# Patient Record
Sex: Male | Born: 1960
Health system: Southern US, Community
[De-identification: ages and names within clinical notes are randomized; demographics above are authoritative.]

## PROBLEM LIST (undated history)

## (undated) DIAGNOSIS — J45909 Unspecified asthma, uncomplicated: Secondary | ICD-10-CM

## (undated) DIAGNOSIS — I219 Acute myocardial infarction, unspecified: Secondary | ICD-10-CM

## (undated) DIAGNOSIS — I10 Essential (primary) hypertension: Secondary | ICD-10-CM

## (undated) DIAGNOSIS — E119 Type 2 diabetes mellitus without complications: Secondary | ICD-10-CM

## (undated) DIAGNOSIS — I493 Ventricular premature depolarization: Secondary | ICD-10-CM

## (undated) DIAGNOSIS — K859 Acute pancreatitis without necrosis or infection, unspecified: Secondary | ICD-10-CM

## (undated) HISTORY — DX: Type 2 diabetes mellitus without complications: E11.9

## (undated) HISTORY — PX: APPENDECTOMY: SHX54

## (undated) HISTORY — PX: HERNIA REPAIR: SHX51

## (undated) HISTORY — DX: Unspecified asthma, uncomplicated: J45.909

---

## 1999-09-23 ENCOUNTER — Ambulatory Visit (HOSPITAL_COMMUNITY): Admission: RE | Admit: 1999-09-23 | Discharge: 1999-09-23 | Payer: Self-pay

## 1999-09-23 ENCOUNTER — Encounter: Payer: Self-pay | Admitting: Orthopedic Surgery

## 2005-12-16 ENCOUNTER — Emergency Department (HOSPITAL_COMMUNITY): Admission: EM | Admit: 2005-12-16 | Discharge: 2005-12-16 | Payer: Self-pay | Admitting: Emergency Medicine

## 2011-08-01 ENCOUNTER — Emergency Department (HOSPITAL_BASED_OUTPATIENT_CLINIC_OR_DEPARTMENT_OTHER): Payer: Self-pay

## 2011-08-01 ENCOUNTER — Inpatient Hospital Stay (HOSPITAL_BASED_OUTPATIENT_CLINIC_OR_DEPARTMENT_OTHER)
Admission: EM | Admit: 2011-08-01 | Discharge: 2011-08-05 | DRG: 438 | Disposition: A | Discharge status: Home / Self Care | Payer: Self-pay | Attending: Internal Medicine | Admitting: Internal Medicine

## 2011-08-01 ENCOUNTER — Other Ambulatory Visit: Payer: Self-pay

## 2011-08-01 ENCOUNTER — Encounter (HOSPITAL_BASED_OUTPATIENT_CLINIC_OR_DEPARTMENT_OTHER): Payer: Self-pay

## 2011-08-01 ENCOUNTER — Emergency Department (INDEPENDENT_AMBULATORY_CARE_PROVIDER_SITE_OTHER): Payer: Self-pay

## 2011-08-01 DIAGNOSIS — F1020 Alcohol dependence, uncomplicated: Secondary | ICD-10-CM | POA: Diagnosis present

## 2011-08-01 DIAGNOSIS — E876 Hypokalemia: Secondary | ICD-10-CM | POA: Diagnosis present

## 2011-08-01 DIAGNOSIS — K5730 Diverticulosis of large intestine without perforation or abscess without bleeding: Secondary | ICD-10-CM

## 2011-08-01 DIAGNOSIS — R110 Nausea: Secondary | ICD-10-CM

## 2011-08-01 DIAGNOSIS — N390 Urinary tract infection, site not specified: Secondary | ICD-10-CM | POA: Diagnosis present

## 2011-08-01 DIAGNOSIS — Z79899 Other long term (current) drug therapy: Secondary | ICD-10-CM

## 2011-08-01 DIAGNOSIS — IMO0002 Reserved for concepts with insufficient information to code with codable children: Secondary | ICD-10-CM | POA: Diagnosis present

## 2011-08-01 DIAGNOSIS — K869 Disease of pancreas, unspecified: Secondary | ICD-10-CM | POA: Diagnosis present

## 2011-08-01 DIAGNOSIS — E1069 Type 1 diabetes mellitus with other specified complication: Secondary | ICD-10-CM | POA: Diagnosis present

## 2011-08-01 DIAGNOSIS — E1110 Type 2 diabetes mellitus with ketoacidosis without coma: Secondary | ICD-10-CM

## 2011-08-01 DIAGNOSIS — R109 Unspecified abdominal pain: Secondary | ICD-10-CM

## 2011-08-01 DIAGNOSIS — K7689 Other specified diseases of liver: Secondary | ICD-10-CM

## 2011-08-01 DIAGNOSIS — E1010 Type 1 diabetes mellitus with ketoacidosis without coma: Secondary | ICD-10-CM | POA: Diagnosis present

## 2011-08-01 DIAGNOSIS — M79609 Pain in unspecified limb: Secondary | ICD-10-CM

## 2011-08-01 DIAGNOSIS — E782 Mixed hyperlipidemia: Secondary | ICD-10-CM | POA: Diagnosis present

## 2011-08-01 DIAGNOSIS — F17200 Nicotine dependence, unspecified, uncomplicated: Secondary | ICD-10-CM | POA: Diagnosis present

## 2011-08-01 DIAGNOSIS — K859 Acute pancreatitis without necrosis or infection, unspecified: Principal | ICD-10-CM

## 2011-08-01 DIAGNOSIS — D72829 Elevated white blood cell count, unspecified: Secondary | ICD-10-CM

## 2011-08-01 DIAGNOSIS — Z794 Long term (current) use of insulin: Secondary | ICD-10-CM

## 2011-08-01 DIAGNOSIS — R079 Chest pain, unspecified: Secondary | ICD-10-CM

## 2011-08-01 DIAGNOSIS — E1169 Type 2 diabetes mellitus with other specified complication: Secondary | ICD-10-CM | POA: Diagnosis present

## 2011-08-01 HISTORY — DX: Ventricular premature depolarization: I49.3

## 2011-08-01 LAB — DIFFERENTIAL
Basophils Absolute: 0 10*3/uL (ref 0.0–0.1)
Basophils Relative: 0 % (ref 0–1)
Eosinophils Relative: 0 % (ref 0–5)
Monocytes Absolute: 1 10*3/uL (ref 0.1–1.0)
Monocytes Relative: 7 % (ref 3–12)

## 2011-08-01 LAB — BASIC METABOLIC PANEL
BUN: 16 mg/dL (ref 6–23)
BUN: 15 mg/dL (ref 6–23)
CO2: 18 mEq/L — ABNORMAL LOW (ref 19–32)
CO2: 18 mEq/L — ABNORMAL LOW (ref 19–32)
Calcium: 8.6 mg/dL (ref 8.4–10.5)
Calcium: 8.1 mg/dL — ABNORMAL LOW (ref 8.4–10.5)
Calcium: 7.9 mg/dL — ABNORMAL LOW (ref 8.4–10.5)
Chloride: 94 mEq/L — ABNORMAL LOW (ref 96–112)
Chloride: 97 mEq/L (ref 96–112)
Creatinine, Ser: 0.8 mg/dL (ref 0.50–1.35)
Creatinine, Ser: 0.61 mg/dL (ref 0.50–1.35)
Creatinine, Ser: 0.61 mg/dL (ref 0.50–1.35)
GFR calc Af Amer: >90 mL/min (ref >90)
GFR calc Af Amer: >90 mL/min (ref >90)
GFR calc non Af Amer: >90 mL/min (ref >90)
Glucose, Bld: 325 mg/dL — ABNORMAL HIGH (ref 70–99)
Glucose, Bld: 196 mg/dL — ABNORMAL HIGH (ref 70–99)
Potassium: 3.8 mEq/L (ref 3.5–5.1)

## 2011-08-01 LAB — CBC
MCH: 33.3 pg (ref 26.0–34.0)
MCH: 34.5 pg — ABNORMAL HIGH (ref 26.0–34.0)
MCHC: 36.5 g/dL — ABNORMAL HIGH (ref 30.0–36.0)
MCHC: 37.7 g/dL — ABNORMAL HIGH (ref 30.0–36.0)
MCV: 91.3 fL (ref 78.0–100.0)
Platelets: 190 10*3/uL (ref 150–400)
Platelets: 149 10*3/uL — ABNORMAL LOW (ref 150–400)
RDW: 13.3 % (ref 11.5–15.5)
RDW: 13.1 % (ref 11.5–15.5)
WBC: 14.9 10*3/uL — ABNORMAL HIGH (ref 4.0–10.5)

## 2011-08-01 LAB — POCT I-STAT 3, ART BLOOD GAS (G3+)
Bicarbonate: 13.4 mEq/L — ABNORMAL LOW (ref 20.0–24.0)
O2 Saturation: 96 %
TCO2: 14 mmol/L (ref 0–100)
pCO2 arterial: 27.9 mmHg — ABNORMAL LOW (ref 35.0–45.0)
pO2, Arterial: 88 mmHg (ref 80.0–100.0)

## 2011-08-01 LAB — CARDIAC PANEL(CRET KIN+CKTOT+MB+TROPI): CK, MB: 1.6 ng/mL (ref 0.3–4.0)

## 2011-08-01 LAB — GLUCOSE, CAPILLARY
Glucose-Capillary: 330 mg/dL — ABNORMAL HIGH (ref 70–99)
Glucose-Capillary: 214 mg/dL — ABNORMAL HIGH (ref 70–99)
Glucose-Capillary: 191 mg/dL — ABNORMAL HIGH (ref 70–99)

## 2011-08-01 LAB — MRSA PCR SCREENING: MRSA by PCR: NEGATIVE

## 2011-08-01 LAB — HEPATIC FUNCTION PANEL
ALT: 46 U/L (ref 0–53)
Bilirubin, Direct: 0.3 mg/dL (ref 0.0–0.3)
Indirect Bilirubin: 1 mg/dL — ABNORMAL HIGH (ref 0.3–0.9)
Total Bilirubin: 1.3 mg/dL — ABNORMAL HIGH (ref 0.3–1.2)

## 2011-08-01 LAB — URINALYSIS, ROUTINE W REFLEX MICROSCOPIC
Ketones, ur: >80 mg/dL — AB
Nitrite: NEGATIVE
Urobilinogen, UA: 0.2 mg/dL (ref 0.0–1.0)

## 2011-08-01 LAB — URINE MICROSCOPIC-ADD ON

## 2011-08-01 LAB — TROPONIN I: Troponin I: <0.3 ng/mL (ref <0.30)

## 2011-08-01 MED ORDER — SODIUM CHLORIDE 0.9 % IV SOLN: INTRAVENOUS | Status: DC

## 2011-08-01 MED ORDER — THIAMINE HCL 100 MG/ML IJ SOLN
100.0000 mg | Freq: Every day | INTRAMUSCULAR | Status: DC
  Administered 2011-08-02: 100 mg via INTRAVENOUS
  Filled 2011-08-01 (×5): qty 1

## 2011-08-01 MED ORDER — SODIUM CHLORIDE 0.9 % IV BOLUS (SEPSIS)
1000.0000 mL | Freq: Once | INTRAVENOUS | Status: AC
  Administered 2011-08-01: 1000 mL via INTRAVENOUS

## 2011-08-01 MED ORDER — LORAZEPAM 2 MG/ML IJ SOLN
0.0000 mg | Freq: Four times a day (QID) | INTRAMUSCULAR | Status: AC
  Administered 2011-08-02: 2 mg via INTRAVENOUS
  Administered 2011-08-02 (×2): 1 mg via INTRAVENOUS
  Filled 2011-08-01 (×2): qty 1

## 2011-08-01 MED ORDER — DEXTROSE 50 % IV SOLN: 25.0000 mL | INTRAVENOUS | Status: DC | PRN

## 2011-08-01 MED ORDER — IOHEXOL 300 MG/ML  SOLN
100.0000 mL | Freq: Once | INTRAMUSCULAR | Status: AC | PRN
  Administered 2011-08-01: 100 mL via INTRAVENOUS

## 2011-08-01 MED ORDER — LORAZEPAM 2 MG/ML IJ SOLN
1.0000 mg | Freq: Four times a day (QID) | INTRAMUSCULAR | Status: AC | PRN
  Administered 2011-08-02: 1 mg via INTRAVENOUS
  Filled 2011-08-01 (×2): qty 1

## 2011-08-01 MED ORDER — FOLIC ACID 1 MG PO TABS
1.0000 mg | ORAL_TABLET | Freq: Every day | ORAL | Status: DC
  Administered 2011-08-01 – 2011-08-05 (×5): 1 mg via ORAL
  Filled 2011-08-01 (×5): qty 1

## 2011-08-01 MED ORDER — LORAZEPAM 2 MG/ML IJ SOLN: 0.0000 mg | Freq: Two times a day (BID) | INTRAMUSCULAR | Status: DC

## 2011-08-01 MED ORDER — HYDROMORPHONE HCL PF 1 MG/ML IJ SOLN
1.0000 mg | Freq: Once | INTRAMUSCULAR | Status: AC
  Administered 2011-08-01: 1 mg via INTRAVENOUS
  Filled 2011-08-01: qty 1

## 2011-08-01 MED ORDER — ONDANSETRON HCL 4 MG/2ML IJ SOLN
4.0000 mg | Freq: Once | INTRAMUSCULAR | Status: AC
  Administered 2011-08-01: 4 mg via INTRAVENOUS
  Filled 2011-08-01: qty 2

## 2011-08-01 MED ORDER — SODIUM CHLORIDE 0.9 % IV BOLUS (SEPSIS): 1000.0000 mL | Freq: Once | INTRAVENOUS | Status: DC

## 2011-08-01 MED ORDER — POTASSIUM CHLORIDE 10 MEQ/100ML IV SOLN
10.0000 meq | Freq: Once | INTRAVENOUS | Status: AC
  Administered 2011-08-01: 10 meq via INTRAVENOUS
  Filled 2011-08-01: qty 100

## 2011-08-01 MED ORDER — SODIUM CHLORIDE 0.9 % IV SOLN
INTRAVENOUS | Status: AC
  Administered 2011-08-01 (×2): via INTRAVENOUS

## 2011-08-01 MED ORDER — VITAMIN B-1 100 MG PO TABS
100.0000 mg | ORAL_TABLET | Freq: Every day | ORAL | Status: DC
  Administered 2011-08-01 – 2011-08-05 (×4): 100 mg via ORAL
  Filled 2011-08-01 (×5): qty 1

## 2011-08-01 MED ORDER — ADULT MULTIVITAMIN W/MINERALS CH
1.0000 | ORAL_TABLET | Freq: Every day | ORAL | Status: DC
  Administered 2011-08-01 – 2011-08-05 (×5): 1 via ORAL
  Filled 2011-08-01 (×5): qty 1

## 2011-08-01 MED ORDER — ONDANSETRON HCL 4 MG/2ML IJ SOLN
4.0000 mg | Freq: Three times a day (TID) | INTRAMUSCULAR | Status: AC | PRN
  Administered 2011-08-01: 4 mg via INTRAVENOUS
  Filled 2011-08-01: qty 2

## 2011-08-01 MED ORDER — SODIUM CHLORIDE 0.9 % IV SOLN
INTRAVENOUS | Status: DC
  Administered 2011-08-01: 9.6 [IU]/h via INTRAVENOUS
  Administered 2011-08-01: 2.8 [IU]/h via INTRAVENOUS
  Filled 2011-08-01: qty 3

## 2011-08-01 MED ORDER — IOHEXOL 300 MG/ML  SOLN
20.0000 mL | INTRAMUSCULAR | Status: AC
  Administered 2011-08-01 (×2): 20 mL via ORAL

## 2011-08-01 MED ORDER — DEXTROSE-NACL 5-0.45 % IV SOLN
INTRAVENOUS | Status: DC
  Administered 2011-08-01 – 2011-08-02 (×3): via INTRAVENOUS

## 2011-08-01 MED ORDER — SODIUM CHLORIDE 0.9 % IV SOLN
999.0000 mL | INTRAVENOUS | Status: DC
  Administered 2011-08-01: 999 mL via INTRAVENOUS

## 2011-08-01 MED ORDER — POTASSIUM CHLORIDE 10 MEQ/100ML IV SOLN: 10.0000 meq | INTRAVENOUS | Status: DC

## 2011-08-01 MED ORDER — HYDROMORPHONE HCL PF 1 MG/ML IJ SOLN
1.0000 mg | INTRAMUSCULAR | Status: AC | PRN
  Administered 2011-08-01 (×2): 1 mg via INTRAVENOUS
  Filled 2011-08-01 (×3): qty 1

## 2011-08-01 MED ORDER — LORAZEPAM 1 MG PO TABS: 1.0000 mg | ORAL_TABLET | Freq: Four times a day (QID) | ORAL | Status: AC | PRN

## 2011-08-01 MED ORDER — SODIUM CHLORIDE 0.9 % IV SOLN
INTRAVENOUS | Status: DC
  Administered 2011-08-02: 01:00:00 via INTRAVENOUS
  Filled 2011-08-01 (×2): qty 1

## 2011-08-01 MED ORDER — SODIUM CHLORIDE 0.9 % IV SOLN
INTRAVENOUS | Status: AC
  Administered 2011-08-01: 21:00:00 via INTRAVENOUS

## 2011-08-01 NOTE — ED Provider Notes (Signed)
History     CSN: [id number]  Arrival date & time 08/01/11  1230   First MD Initiated Contact with Patient 08/01/11 1308      Chief Complaint  Patient presents with  . Abdominal Pain  . Chest Pain    (Consider location/radiation/quality/duration/timing/severity/associated sxs/prior treatment) Patient is a 51 y.o. male presenting with abdominal pain and chest pain. The history is provided by the patient.  Abdominal Pain The primary symptoms of the illness include abdominal pain. The current episode started 2 days ago. The onset of the illness was gradual. The problem has been gradually worsening.  The abdominal pain is located in the epigastric region. The abdominal pain radiates to the chest. The abdominal pain is relieved by nothing.  Chest Pain Primary symptoms include abdominal pain.   Pt has not been eating but can drink water.  No vomiting although nauseated except for one episode after eating yogurt..  He has been constipated for 3 days.  The pain is cramping and severe.  He did have a few beers on Thursday but nothing since.  Past Medical History  Diagnosis Date  . A-fib     Past Surgical History  Procedure Date  . Appendectomy     History reviewed. No pertinent family history.  History  Substance Use Topics  . Smoking status: Former Smoker -- 0.5 packs/day    Types: Cigarettes    Quit date: 07/31/2010  . Smokeless tobacco: Never Used  . Alcohol Use: Yes   social history currently unemployed    Review of Systems  Cardiovascular: Positive for chest pain.  Gastrointestinal: Positive for abdominal pain.  All other systems reviewed and are negative.    Allergies  Review of patient's allergies indicates no known allergies.  Home Medications  No current outpatient prescriptions on file.  BP 149/95  Pulse 93  Temp(Src) 97.9 F (36.6 C) (Oral)  Resp 17  Ht 5\' 7"  (1.702 m)  Wt 200 lb (90.719 kg)  BMI 31.32 kg/m2  SpO2 97%  Physical Exam  Nursing  note and vitals reviewed. Constitutional: He appears well-developed and well-nourished. No distress.  HENT:  Head: Normocephalic and atraumatic.  Right Ear: External ear normal.  Left Ear: External ear normal.  Eyes: Conjunctivae are normal. Right eye exhibits no discharge. Left eye exhibits no discharge. No scleral icterus.  Neck: Neck supple. No tracheal deviation present.  Cardiovascular: Normal rate, regular rhythm and intact distal pulses.   Pulmonary/Chest: Effort normal and breath sounds normal. No stridor. No respiratory distress. He has no wheezes. He has no rales.  Abdominal: Soft. Bowel sounds are normal. He exhibits no distension. There is tenderness (diffusely tender). There is guarding. There is no rebound.  Musculoskeletal: He exhibits no edema and no tenderness.  Neurological: He is alert. He has normal strength. No sensory deficit. Cranial nerve deficit:  no gross defecits noted. He exhibits normal muscle tone. He displays no seizure activity. Coordination normal.  Skin: Skin is warm and dry. No rash noted.  Psychiatric: He has a normal mood and affect.    ED Course  Procedures (including critical care time)  Date: 08/01/2011  Rate: 111  Rhythm: sinus tachycardia  QRS Axis: normal  Intervals: normal  ST/T Wave abnormalities: nonspecific ST changes  Conduction Disutrbances:none  Narrative Interpretation:   Old EKG Reviewed: unchanged  Medications  0.9 %  sodium chloride infusion (999 mL Intravenous New Bag/Given 08/01/11 1408)  insulin regular (NOVOLIN R,HUMULIN R) 1 Units/mL in sodium chloride 0.9 % 100  mL infusion (not administered)  0.9 %  sodium chloride infusion (not administered)  HYDROmorphone (DILAUDID) injection 1 mg (1 mg Intravenous Given 08/01/11 1324)  ondansetron (ZOFRAN) injection 4 mg (4 mg Intravenous Given 08/01/11 1324)  sodium chloride 0.9 % bolus 1,000 mL (1000 mL Intravenous Given 08/01/11 1324)     Labs Reviewed  BASIC METABOLIC PANEL - Abnormal;  Notable for the following:    Sodium 128 (*)    Chloride 89 (*)    CO2 14 (*)    Glucose, Bld 414 (*)    All other components within normal limits  HEPATIC FUNCTION PANEL - Abnormal; Notable for the following:    Total Bilirubin 1.3 (*)    Indirect Bilirubin 1.0 (*)    All other components within normal limits  LIPASE, BLOOD - Abnormal; Notable for the following:    Lipase 886 (*) RESULT CONFIRMED BY AUTOMATED DILUTION   All other components within normal limits  TROPONIN I  CBC  DIFFERENTIAL  LIPASE, BLOOD  URINALYSIS, ROUTINE W REFLEX MICROSCOPIC  HEPATIC FUNCTION PANEL  DIFFERENTIAL   No results found.   1. Acute pancreatitis   2. Diabetic ketoacidosis       MDM  The patient has acute pancreatitis with new onset diabetes and hyperglycemia. Patient also has a metabolic acidosis with an anion gap of 25 consistent with diabetic ketoacidosis.  Patient has no known history of diabetes. The etiology of his pancreatitis is likely related to alcohol disease. When I questioned the patient further he does admit to drinking alcohol very regularly times heavily. IV fluids been started as well as pain medications. Will order an insulin infusion. This remains hemodynamically stable that he would need to be admitted to the hospital for further treatment and evaluation.  CRITICAL CARE Performed by: Celene Kras   Total critical care time: 35 Critical care time was exclusive of separately billable procedures and treating other patients.  Critical care was necessary to treat or prevent imminent or life-threatening deterioration.  Critical care was time spent personally by me on the following activities: development of treatment plan with patient and/or surrogate as well as nursing, discussions with consultants, evaluation of patient's response to treatment, examination of patient, obtaining history from patient or surrogate, ordering and performing treatments and interventions, ordering and  review of laboratory studies, ordering and review of radiographic studies, pulse oximetry and re-evaluation of patient's condition.        Celene Kras, MD 08/01/11 1500

## 2011-08-01 NOTE — H&P (Signed)
Kyle Barber is an 51 y.o. male.   PCP - None Chief Complaint: Abdominal pain. HPI: 51 years old male with no significant past medical history, who drinks alcohol everyday for last many years present to the ER because of persistent abdominal pain over the last 3 days. Patient states that 3 days ago pain started around epigastric area and it was like a burning sensation and he tried to take over-the-counter antacids which did not relieve his pain. The pain progressively worsened and became more diffuse and associated with nausea and vomiting when he decided to come to the ER. In the ER at med center [facility] patient was found to have a high lipase and high blood sugar with an anion gap. Patient had a CAT scan of the abdomen and pelvis which showed pancreatitis patient has been admitted for further management. Patient states earlier in the day he had mild shortness of breath but presently he is not short of breath denies any chest pain dizziness loss of consciousness fever chills or diarrhea.  Past Medical History  Diagnosis Date  . Premature ventricular contraction     Past Surgical History  Procedure Date  . Appendectomy     History reviewed. No pertinent family history. Social History:  reports that he quit smoking about 4 weeks ago. His smoking use included Cigarettes. He has a 2.5 pack-year smoking history. He quit smokeless tobacco use about 4 weeks ago. He reports that he drinks about 16.8 ounces of alcohol per week. He reports that he does not use illicit drugs.  Allergies: No Known Allergies  Medications Prior to Admission  Medication Dose Route Frequency Provider Last Rate Last Dose  . 0.9 %  sodium chloride infusion   Intravenous STAT Celene Kras, MD 150 mL/hr at 08/01/11 1620    . 0.9 %  sodium chloride infusion   Intravenous Continuous Eduard Clos, MD      . 0.9 %  sodium chloride infusion   Intravenous Continuous Eduard Clos, MD      . dextrose 5 %-0.45 %  sodium chloride infusion   Intravenous Continuous Eduard Clos, MD      . dextrose 50 % solution 25 mL  25 mL Intravenous PRN Eduard Clos, MD      . HYDROmorphone (DILAUDID) injection 1 mg  1 mg Intravenous Once Celene Kras, MD   1 mg at 08/01/11 1324  . HYDROmorphone (DILAUDID) injection 1 mg  1 mg Intravenous Q4H PRN Celene Kras, MD   1 mg at 08/01/11 1829  . insulin regular (NOVOLIN R,HUMULIN R) 1 Units/mL in sodium chloride 0.9 % 100 mL infusion   Intravenous Continuous Celene Kras, MD 9.2 mL/hr at 08/01/11 2029 9.2 Units/hr at 08/01/11 2029  . insulin regular (NOVOLIN R,HUMULIN R) 1 Units/mL in sodium chloride 0.9 % 100 mL infusion   Intravenous Continuous Eduard Clos, MD      . iohexol (OMNIPAQUE) 300 MG/ML solution 100 mL  100 mL Intravenous Once PRN Medication Radiologist, MD   100 mL at 08/01/11 1644  . iohexol (OMNIPAQUE) 300 MG/ML solution 20 mL  20 mL Oral Q1 Hr x 2 Medication Radiologist, MD   20 mL at 08/01/11 1612  . ondansetron (ZOFRAN) injection 4 mg  4 mg Intravenous Once Celene Kras, MD   4 mg at 08/01/11 1324  . ondansetron (ZOFRAN) injection 4 mg  4 mg Intravenous Q8H PRN Celene Kras, MD   4  mg at 08/01/11 1829  . potassium chloride 10 mEq in 100 mL IVPB  10 mEq Intravenous Once Raeford Razor, MD 100 mL/hr at 08/01/11 1739 10 mEq at 08/01/11 1739  . potassium chloride 10 mEq in 100 mL IVPB  10 mEq Intravenous Q1H Eduard Clos, MD      . sodium chloride 0.9 % bolus 1,000 mL  1,000 mL Intravenous Once Celene Kras, MD   1,000 mL at 08/01/11 1324  . sodium chloride 0.9 % bolus 1,000 mL  1,000 mL Intravenous Once Celene Kras, MD   1,000 mL at 08/01/11 1600  . DISCONTD: 0.9 %  sodium chloride infusion  999 mL Intravenous Continuous Celene Kras, MD   999 mL at 08/01/11 1408  . DISCONTD: 0.9 %  sodium chloride infusion   Intravenous Continuous Celene Kras, MD      . DISCONTD: sodium chloride 0.9 % bolus 1,000 mL  1,000 mL Intravenous Once Celene Kras, MD        No current outpatient prescriptions on file as of 08/01/2011.    Results for orders placed during the hospital encounter of 08/01/11 (from the past 48 hour(s))  CBC     Status: Abnormal   Collection Time   08/01/11  1:01 PM      Component Value Range Comment   WBC 14.9 (*) 4.0 - 10.5 (K/uL)    RBC 4.80  4.22 - 5.81 (MIL/uL)    Hemoglobin 16.0  13.0 - 17.0 (g/dL)    HCT 16.1  09.6 - 04.5 (%)    MCV 91.3  78.0 - 100.0 (fL)    MCH 33.3  26.0 - 34.0 (pg)    MCHC 36.5 (*) 30.0 - 36.0 (g/dL) CORRECTED FOR INTERFERING SUBSTANCE   RDW 13.3  11.5 - 15.5 (%)    Platelets 190  150 - 400 (K/uL)   BASIC METABOLIC PANEL     Status: Abnormal   Collection Time   08/01/11  1:01 PM      Component Value Range Comment   Sodium 128 (*) 135 - 145 (mEq/L)    Potassium 4.0  3.5 - 5.1 (mEq/L)    Chloride 89 (*) 96 - 112 (mEq/L)    CO2 14 (*) 19 - 32 (mEq/L)    Glucose, Bld 414 (*) 70 - 99 (mg/dL)    BUN 15  6 - 23 (mg/dL)    Creatinine, Ser 4.09  0.50 - 1.35 (mg/dL)    Calcium 8.6  8.4 - 10.5 (mg/dL)    GFR calc non Af Amer >90  >90 (mL/min)    GFR calc Af Amer >90  >90 (mL/min)   TROPONIN I     Status: Normal   Collection Time   08/01/11  1:01 PM      Component Value Range Comment   Troponin I <0.30  <0.30 (ng/mL)   DIFFERENTIAL     Status: Abnormal   Collection Time   08/01/11  1:01 PM      Component Value Range Comment   Neutrophils Relative 84 (*) 43 - 77 (%)    Neutro Abs 12.5 (*) 1.7 - 7.7 (K/uL)    Lymphocytes Relative 9 (*) 12 - 46 (%)    Lymphs Abs 1.3  0.7 - 4.0 (K/uL)    Monocytes Relative 7  3 - 12 (%)    Monocytes Absolute 1.0  0.1 - 1.0 (K/uL)    Eosinophils Relative 0  0 - 5 (%)  Eosinophils Absolute 0.0  0.0 - 0.7 (K/uL)    Basophils Relative 0  0 - 1 (%)    Basophils Absolute 0.0  0.0 - 0.1 (K/uL)   HEPATIC FUNCTION PANEL     Status: Abnormal   Collection Time   08/01/11  1:01 PM      Component Value Range Comment   Total Protein 7.9  6.0 - 8.3 (g/dL)    Albumin 3.8  3.5  - 5.2 (g/dL)    AST 18  0 - 37 (U/L)    ALT 46  0 - 53 (U/L)    Alkaline Phosphatase 79  39 - 117 (U/L)    Total Bilirubin 1.3 (*) 0.3 - 1.2 (mg/dL)    Bilirubin, Direct 0.3  0.0 - 0.3 (mg/dL)    Indirect Bilirubin 1.0 (*) 0.3 - 0.9 (mg/dL)   LIPASE, BLOOD     Status: Abnormal   Collection Time   08/01/11  1:01 PM      Component Value Range Comment   Lipase 886 (*) 11 - 59 (U/L) RESULT CONFIRMED BY AUTOMATED DILUTION  GLUCOSE, CAPILLARY     Status: Abnormal   Collection Time   08/01/11  3:05 PM      Component Value Range Comment   Glucose-Capillary 343 (*) 70 - 99 (mg/dL)    Comment 1 Documented in Chart     POCT I-STAT 3, BLOOD GAS (G3+)     Status: Abnormal   Collection Time   08/01/11  3:34 PM      Component Value Range Comment   pH, Arterial 7.289 (*) 7.350 - 7.450     pCO2 arterial 27.9 (*) 35.0 - 45.0 (mmHg)    pO2, Arterial 88.0  80.0 - 100.0 (mmHg)    Bicarbonate 13.4 (*) 20.0 - 24.0 (mEq/L)    TCO2 14  0 - 100 (mmol/L)    O2 Saturation 96.0      Acid-base deficit 11.0 (*) 0.0 - 2.0 (mmol/L)    Patient temperature 99.0 F      Collection site RADIAL, ALLEN'S TEST ACCEPTABLE      Drawn by RT      Sample type ARTERIAL     URINALYSIS, ROUTINE W REFLEX MICROSCOPIC     Status: Abnormal   Collection Time   08/01/11  3:54 PM      Component Value Range Comment   Color, Urine YELLOW  YELLOW     APPearance CLEAR  CLEAR     Specific Gravity, Urine 1.040 (*) 1.005 - 1.030     pH 5.5  5.0 - 8.0     Glucose, UA >1000 (*) NEGATIVE (mg/dL)    Hgb urine dipstick NEGATIVE  NEGATIVE     Bilirubin Urine LARGE (*) NEGATIVE     Ketones, ur >80 (*) NEGATIVE (mg/dL)    Protein, ur 30 (*) NEGATIVE (mg/dL)    Urobilinogen, UA 0.2  0.0 - 1.0 (mg/dL)    Nitrite NEGATIVE  NEGATIVE     Leukocytes, UA NEGATIVE  NEGATIVE    URINE MICROSCOPIC-ADD ON     Status: Abnormal   Collection Time   08/01/11  3:54 PM      Component Value Range Comment   Squamous Epithelial / LPF RARE  RARE     WBC, UA 0-2   <3 (WBC/hpf)    RBC / HPF 0-2  <3 (RBC/hpf)    Bacteria, UA MANY (*) RARE     Casts GRANULAR CAST (*) NEGATIVE  HYALINE CASTS  GLUCOSE, CAPILLARY     Status: Abnormal   Collection Time   08/01/11  4:06 PM      Component Value Range Comment   Glucose-Capillary 370 (*) 70 - 99 (mg/dL)   BASIC METABOLIC PANEL     Status: Abnormal   Collection Time   08/01/11  4:45 PM      Component Value Range Comment   Sodium 128 (*) 135 - 145 (mEq/L)    Potassium 3.8  3.5 - 5.1 (mEq/L)    Chloride 94 (*) 96 - 112 (mEq/L)    CO2 18 (*) 19 - 32 (mEq/L)    Glucose, Bld 325 (*) 70 - 99 (mg/dL)    BUN 16  6 - 23 (mg/dL)    Creatinine, Ser 1.61  0.50 - 1.35 (mg/dL)    Calcium 7.7 (*) 8.4 - 10.5 (mg/dL)    GFR calc non Af Amer >90  >90 (mL/min)    GFR calc Af Amer >90  >90 (mL/min)   GLUCOSE, CAPILLARY     Status: Abnormal   Collection Time   08/01/11  5:13 PM      Component Value Range Comment   Glucose-Capillary 330 (*) 70 - 99 (mg/dL)   GLUCOSE, CAPILLARY     Status: Abnormal   Collection Time   08/01/11  6:03 PM      Component Value Range Comment   Glucose-Capillary 301 (*) 70 - 99 (mg/dL)    Comment 1 Notify RN      Comment 2 Documented in Chart     GLUCOSE, CAPILLARY     Status: Abnormal   Collection Time   08/01/11  7:22 PM      Component Value Range Comment   Glucose-Capillary 252 (*) 70 - 99 (mg/dL)    Comment 1 Notify RN      Comment 2 Documented in Chart     GLUCOSE, CAPILLARY     Status: Abnormal   Collection Time   08/01/11  8:26 PM      Component Value Range Comment   Glucose-Capillary 214 (*) 70 - 99 (mg/dL)    Dg Chest 2 View  0/02/6044  *RADIOLOGY REPORT*  Clinical Data: Abdominal pain.  Chest pain.  Left arm pain and nausea.  CHEST - 2 VIEW  Comparison: 12/16/2005.  Findings: Healed right posterior rib fractures.  The lungs appear clear aside from basilar atelectasis.  Cardiopericardial silhouette appears within normal limits.  Monitoring leads are projected over the chest.  Trachea  midline. No airspace disease.  No effusion.  IMPRESSION: No active cardiopulmonary disease.  Original Report Authenticated By: Andreas Newport, M.D.   Ct Abdomen Pelvis W Contrast  08/01/2011  *RADIOLOGY REPORT*  Clinical Data: Acute pancreatitis.  Leukocytosis.  Diffuse abdominal pain.  Nausea.  CT ABDOMEN AND PELVIS WITH CONTRAST  Technique:  Multidetector CT imaging of the abdomen and pelvis was performed following the standard protocol during bolus administration of intravenous contrast.  Contrast: OMNIPAQUE IOHEXOL 300 MG/ML IV SOLN  Comparison: None.  Findings: Diffuse pancreatic swelling is seen with moderate peripancreatic inflammatory changes, which are seen tracking inferiorly within the small bowel mesentery and retroperitoneal fat planes.  There is no evidence of pancreatic necrosis or mass.  No evidence of peripancreatic fluid collections.  No evidence of splenic or portal vein thrombosis.  Moderate hepatic steatosis is noted but no liver masses are identified.  Gallbladder is unremarkable.  The spleen, adrenal glands, and kidneys are normal in appearance.  No soft tissue masses  or lymphadenopathy identified.  Diverticulosis is seen involving the descending sigmoid colon.  No evidence of diverticulitis.  IMPRESSION:  1.  Moderate acute pancreatitis.  No evidence of pancreatic necrosis, pseudocyst formation, or other complication. 2. Moderate hepatic steatosis. 3.  Diverticulosis.  No radiographic evidence of diverticulitis.  Original Report Authenticated By: Danae Orleans, M.D.    Review of Systems  Constitutional: Negative.   HENT: Negative.   Eyes: Negative.   Respiratory: Negative.   Cardiovascular: Negative.   Gastrointestinal: Positive for nausea, vomiting and abdominal pain.  Genitourinary: Negative.   Musculoskeletal: Negative.   Skin: Negative.   Neurological: Negative.   Endo/Heme/Allergies: Negative.   Psychiatric/Behavioral: Negative.     Blood pressure 143/93, pulse  121, temperature 98.9 F (37.2 C), temperature source Oral, resp. rate 23, height 5\' 7"  (1.702 m), weight 95.5 kg (210 lb 8.6 oz), SpO2 94.00%. Physical Exam  Constitutional: He is oriented to person, place, and time. He appears well-developed and well-nourished. No distress.  HENT:  Head: Normocephalic and atraumatic.  Right Ear: External ear normal.  Left Ear: External ear normal.  Nose: Nose normal.  Mouth/Throat: Oropharynx is clear and moist. No oropharyngeal exudate.  Eyes: Conjunctivae and EOM are normal. Pupils are equal, round, and reactive to light. Right eye exhibits no discharge. Left eye exhibits no discharge. No scleral icterus.  Neck: Normal range of motion. Neck supple.  Cardiovascular:       Sinus tachycardia.  Respiratory: Effort normal and breath sounds normal. No respiratory distress. He has no wheezes. He has no rales.  GI: Soft. Bowel sounds are normal. He exhibits distension. There is no tenderness. There is no rebound and no guarding.  Musculoskeletal: Normal range of motion. He exhibits no edema and no tenderness.  Neurological: He is alert and oriented to person, place, and time.       Moves upper and lower limbs.  Skin: Skin is warm and dry. No rash noted. He is not diaphoretic. No erythema.  Psychiatric: His behavior is normal.     Assessment/Plan #1. Acute pancreatitis - the most likely cause is alcohol. We will keep patient n.p.o. with aggressive IV hydration. Closely follow intake output and recheck labs in a.m. We'll also get a sonogram to check for the gallbladder and also check lipid panel particularly look for triglycerides. Patient will be placed on pain relief medications. #2. Diabetic ketoacidosis - patient was not diagnosed with diabetes previously. He states he's been having weakness and increased thirst over the last few months. At this time patient is placed on IV insulin per DKA protocol. We will check frequent metabolic panel. #3. Alcoholism -  patient will be placed on alcohol withdrawal protocol. I have advised patient to quit drinking alcohol.  CODE STATUS - full code.  Eduard Clos. 08/01/2011, 8:45 PM

## 2011-08-01 NOTE — ED Notes (Signed)
The patients CBG is 301.

## 2011-08-01 NOTE — ED Notes (Signed)
Pt was informed that we need a urine specimen.  Pt was given container and told to depress call bell when specimen was provided.

## 2011-08-01 NOTE — ED Notes (Signed)
Pt states that he had onset of abdominal pain, chest pain, arm pain, and nausea.  Pt states that last bm was 3 days ago.

## 2011-08-02 ENCOUNTER — Inpatient Hospital Stay (HOSPITAL_COMMUNITY): Payer: Self-pay

## 2011-08-02 LAB — GLUCOSE, CAPILLARY
Glucose-Capillary: 177 mg/dL — ABNORMAL HIGH (ref 70–99)
Glucose-Capillary: 182 mg/dL — ABNORMAL HIGH (ref 70–99)
Glucose-Capillary: 145 mg/dL — ABNORMAL HIGH (ref 70–99)
Glucose-Capillary: 169 mg/dL — ABNORMAL HIGH (ref 70–99)
Glucose-Capillary: 169 mg/dL — ABNORMAL HIGH (ref 70–99)
Glucose-Capillary: 136 mg/dL — ABNORMAL HIGH (ref 70–99)
Glucose-Capillary: 151 mg/dL — ABNORMAL HIGH (ref 70–99)
Glucose-Capillary: 168 mg/dL — ABNORMAL HIGH (ref 70–99)
Glucose-Capillary: 124 mg/dL — ABNORMAL HIGH (ref 70–99)

## 2011-08-02 LAB — BASIC METABOLIC PANEL
BUN: 14 mg/dL (ref 6–23)
Calcium: 8.1 mg/dL — ABNORMAL LOW (ref 8.4–10.5)
GFR calc non Af Amer: >90 mL/min (ref >90)
Glucose, Bld: 175 mg/dL — ABNORMAL HIGH (ref 70–99)

## 2011-08-02 LAB — CBC
MCHC: 35.7 g/dL (ref 30.0–36.0)
RBC: 4.46 MIL/uL (ref 4.22–5.81)
WBC: 13 10*3/uL — ABNORMAL HIGH (ref 4.0–10.5)

## 2011-08-02 LAB — LIPID PANEL
HDL: 31 mg/dL — ABNORMAL LOW (ref >39)
LDL Cholesterol: UNDETERMINED mg/dL (ref 0–99)
Total CHOL/HDL Ratio: 7.1 RATIO

## 2011-08-02 LAB — LIPASE, BLOOD: Lipase: 275 U/L — ABNORMAL HIGH (ref 11–59)

## 2011-08-02 LAB — AMYLASE: Amylase: 62 U/L (ref 0–105)

## 2011-08-02 LAB — CARDIAC PANEL(CRET KIN+CKTOT+MB+TROPI)
CK, MB: 1.5 ng/mL (ref 0.3–4.0)
CK, MB: 1.3 ng/mL (ref 0.3–4.0)
Total CK: 30 U/L (ref 7–232)
Total CK: 26 U/L (ref 7–232)
Troponin I: <0.3 ng/mL (ref <0.30)

## 2011-08-02 MED ORDER — MORPHINE SULFATE 2 MG/ML IJ SOLN
1.0000 mg | INTRAMUSCULAR | Status: DC | PRN
  Administered 2011-08-03: 1 mg via INTRAVENOUS
  Administered 2011-08-04: 2 mg via INTRAVENOUS
  Filled 2011-08-02 (×2): qty 1

## 2011-08-02 MED ORDER — INSULIN ASPART 100 UNIT/ML ~~LOC~~ SOLN: 0.0000 [IU] | SUBCUTANEOUS | Status: DC

## 2011-08-02 MED ORDER — POTASSIUM CHLORIDE 10 MEQ/100ML IV SOLN
INTRAVENOUS | Status: AC
  Administered 2011-08-02: 10 meq via INTRAVENOUS
  Filled 2011-08-02: qty 400

## 2011-08-02 MED ORDER — INSULIN ASPART 100 UNIT/ML ~~LOC~~ SOLN
0.0000 [IU] | Freq: Three times a day (TID) | SUBCUTANEOUS | Status: DC
  Filled 2011-08-02: qty 3

## 2011-08-02 MED ORDER — SODIUM CHLORIDE 0.9 % IV SOLN
INTRAVENOUS | Status: DC
  Administered 2011-08-02: 15.2 [IU]/h via INTRAVENOUS
  Administered 2011-08-02: 13:00:00 via INTRAVENOUS
  Filled 2011-08-02 (×2): qty 1

## 2011-08-02 MED ORDER — INSULIN ASPART 100 UNIT/ML ~~LOC~~ SOLN
0.0000 [IU] | SUBCUTANEOUS | Status: DC
  Administered 2011-08-02 – 2011-08-03 (×3): 3 [IU] via SUBCUTANEOUS
  Administered 2011-08-03: 8 [IU] via SUBCUTANEOUS
  Filled 2011-08-02 (×2): qty 3

## 2011-08-02 MED ORDER — SODIUM CHLORIDE 0.9 % IV SOLN: INTRAVENOUS | Status: DC

## 2011-08-02 MED ORDER — PIPERACILLIN-TAZOBACTAM 3.375 G IVPB
3.3750 g | Freq: Three times a day (TID) | INTRAVENOUS | Status: DC
  Administered 2011-08-02 – 2011-08-04 (×6): 3.375 g via INTRAVENOUS
  Filled 2011-08-02 (×10): qty 50

## 2011-08-02 MED ORDER — POTASSIUM CHLORIDE 10 MEQ/100ML IV SOLN
10.0000 meq | INTRAVENOUS | Status: AC
  Administered 2011-08-02 (×4): 10 meq via INTRAVENOUS

## 2011-08-02 MED ORDER — INSULIN ASPART 100 UNIT/ML ~~LOC~~ SOLN: 0.0000 [IU] | Freq: Every day | SUBCUTANEOUS | Status: DC

## 2011-08-02 MED ORDER — IBUPROFEN 200 MG PO TABS
200.0000 mg | ORAL_TABLET | ORAL | Status: DC | PRN
  Administered 2011-08-02 – 2011-08-03 (×3): 400 mg via ORAL
  Filled 2011-08-02 (×4): qty 2

## 2011-08-02 MED ORDER — HYDROMORPHONE HCL PF 1 MG/ML IJ SOLN
1.0000 mg | INTRAMUSCULAR | Status: DC | PRN
  Administered 2011-08-02: 1 mg via INTRAVENOUS

## 2011-08-02 MED ORDER — INSULIN GLARGINE 100 UNIT/ML ~~LOC~~ SOLN
15.0000 [IU] | Freq: Once | SUBCUTANEOUS | Status: AC
  Administered 2011-08-02: 15 [IU] via SUBCUTANEOUS
  Filled 2011-08-02: qty 3

## 2011-08-02 MED ORDER — OXYCODONE HCL 5 MG PO TABS: 5.0000 mg | ORAL_TABLET | ORAL | Status: DC | PRN

## 2011-08-02 MED ORDER — INSULIN GLARGINE 100 UNIT/ML ~~LOC~~ SOLN
10.0000 [IU] | Freq: Every day | SUBCUTANEOUS | Status: DC
  Filled 2011-08-02: qty 3

## 2011-08-02 MED ORDER — DEXTROSE-NACL 5-0.45 % IV SOLN: INTRAVENOUS | Status: DC

## 2011-08-02 MED ORDER — POTASSIUM CHLORIDE IN NACL 20-0.9 MEQ/L-% IV SOLN
INTRAVENOUS | Status: DC
  Administered 2011-08-02 – 2011-08-04 (×2): via INTRAVENOUS
  Filled 2011-08-02 (×7): qty 1000

## 2011-08-02 MED ORDER — INSULIN GLARGINE 100 UNIT/ML ~~LOC~~ SOLN: 10.0000 [IU] | Freq: Every day | SUBCUTANEOUS | Status: DC

## 2011-08-02 MED ORDER — HYDROMORPHONE HCL PF 1 MG/ML IJ SOLN
INTRAMUSCULAR | Status: AC
  Administered 2011-08-02: 1 mg via INTRAVENOUS
  Filled 2011-08-02: qty 1

## 2011-08-02 NOTE — Progress Notes (Signed)
Inpatient Diabetes Program Recommendations  AACE/ADA: New Consensus Statement on Inpatient Glycemic Control (2009)  Target Ranges:  Prepandial:   less than 140 mg/dL      Peak postprandial:   less than 180 mg/dL (1-2 hours)      Critically ill patients:  140 - 180 mg/dL     Inpatient Diabetes Program Recommendations HgbA1C: Please check an A1C level to assess home glucose levels prior to hospitalization.  Note:  Ambrose Finland RN, MSN, CDE Diabetes Coordinator Inpatient Diabetes Program [phone]

## 2011-08-02 NOTE — Progress Notes (Signed)
[facility] [place] TEAM 8  Subjective: 51 years old male with no significant past medical history, who drinks alcohol everyday for many years who presented to the ER because of persistent abdominal pain over the last 3 days.   At the time of my exam he denies sob, vomiting, or HA.  He reports ongoing sharp abdom pain, but feels that this has improved since his admit.  He is asking to be allowed to eat.  He admits to daily EtOH abuse, and agrees with my recc that he must now fully abstain.    Objective:  Intake/Output Summary (Last 24 hours) at 08/02/11 1550 Last data filed at 08/02/11 1500  Gross per 24 hour  Intake 2619.69 ml  Output    750 ml  Net 1869.69 ml   Blood pressure 146/86, pulse 118, temperature 102.8 F (39.3 C), temperature source Oral, resp. rate 19, height 5\' 7"  (1.702 m), weight 95.5 kg (210 lb 8.6 oz), SpO2 93.00%.  Physical Exam: General: No acute respiratory distress Lungs: Clear to auscultation bilaterally without wheezes or crackles Cardiovascular: tachycardic with regular rhythm without murmur gallop or rub Abdomen: protuberant, appears ascitic, no rebound, tender to palpation in the epigastrium, no focal mass appreciable, soft/not tense Extremities: 1+ B LE edema w/o cyanosis or clubbing Neuro:  A&O x4  Lab Results:  Basename 08/02/11 0342 08/01/11 2230 08/01/11 2100  NA 129* 129* 127*  K 3.7 3.2* 3.4*  CL 99 98 97  CO2 21 18* 18*  GLUCOSE 175* 196* 202*  BUN 14 14 15   CREATININE 0.62 0.61 0.61  CALCIUM 8.1* 7.9* 8.1*  MG 1.6 -- --  PHOS -- -- --    Basename 08/01/11 1301  AST 18  ALT 46  ALKPHOS 79  BILITOT 1.3*  PROT 7.9  ALBUMIN 3.8    Basename 08/02/11 0342 08/01/11 2100 08/01/11 1301  WBC 13.0* 13.5* 14.9*  NEUTROABS -- -- 12.5*  HGB 14.8 16.9 16.0  HCT 41.5 44.8 43.8  MCV 93.0 91.4 91.3  PLT 144* 149* 190    Basename 08/02/11 1235 08/02/11 0342 08/01/11 2100  CKTOTAL 26 30 39  CKMB 1.3 1.5 1.6  CKMBINDEX -- -- --   TROPONINI <0.30 <0.30 <0.30   Micro Results: Recent Results (from the past 240 hour(s))  MRSA PCR SCREENING     Status: Normal   Collection Time   08/01/11  8:15 PM      Component Value Range Status Comment   MRSA by PCR NEGATIVE  NEGATIVE  Final    Studies/Results: All recent x-ray/radiology reports have been reviewed in detail.   Medications: I have reviewed the patient's complete medication list.  Assessment/Plan:  Acute alcoholic pancreatitis  Pt remains tenuous, w/ fever, tachycardia, and abdom pain - will need to cont to monitor on SDU   FUO CT scan of abdom without evidence of acute complications/necrosis - ?due simply to inflammation associated w/ pancreatitis - given extent of temp elevation, emperic GI coverage is felt to be prudent  EtOH abuse/alcoholism I have counseled the patient on the absolute need for abstinence, and the direct connection between his illness and his EtOH abuse - he voices understanding and reports that he will quite drinking - I will request a cessation consult to assist him  Uncontrolled DM/Metabolic acidosis/early DKA The pt denies a prior hx of DM - this could be a poor prognostic sign in regard to his pancreatitis - will wean off insulin gtt in customary way - follow CBGs -  I will check and A1c  Hypertriglyceridemia It is unclear if this has a cause or effect relationship to his pancreatitis - will need to recheck once his sx are much improved and the pt is in a true fasting state  Lonia Blood, MD [facility] Office  [phone] Pager [phone]  On-Call/Text Page:      [email or web address]      password [facility]

## 2011-08-03 LAB — CBC
HCT: 39.3 % (ref 39.0–52.0)
Hemoglobin: 14.2 g/dL (ref 13.0–17.0)
MCH: 33.6 pg (ref 26.0–34.0)
MCHC: 36.1 g/dL — ABNORMAL HIGH (ref 30.0–36.0)
MCV: 92.9 fL (ref 78.0–100.0)

## 2011-08-03 LAB — GLUCOSE, CAPILLARY
Glucose-Capillary: 192 mg/dL — ABNORMAL HIGH (ref 70–99)
Glucose-Capillary: 299 mg/dL — ABNORMAL HIGH (ref 70–99)
Glucose-Capillary: 250 mg/dL — ABNORMAL HIGH (ref 70–99)

## 2011-08-03 LAB — COMPREHENSIVE METABOLIC PANEL
ALT: 18 U/L (ref 0–53)
AST: 13 U/L (ref 0–37)
Alkaline Phosphatase: 58 U/L (ref 39–117)
Calcium: 8.8 mg/dL (ref 8.4–10.5)
Potassium: 3.2 mEq/L — ABNORMAL LOW (ref 3.5–5.1)
Sodium: 138 mEq/L (ref 135–145)
Total Protein: 6.3 g/dL (ref 6.0–8.3)

## 2011-08-03 MED ORDER — LIVING WELL WITH DIABETES BOOK
Freq: Once | Status: AC
  Administered 2011-08-03: 15:00:00
  Filled 2011-08-03: qty 1

## 2011-08-03 MED ORDER — INSULIN ASPART 100 UNIT/ML ~~LOC~~ SOLN: 0.0000 [IU] | Freq: Every day | SUBCUTANEOUS | Status: DC

## 2011-08-03 MED ORDER — INSULIN ASPART 100 UNIT/ML ~~LOC~~ SOLN: 0.0000 [IU] | Freq: Three times a day (TID) | SUBCUTANEOUS | Status: DC

## 2011-08-03 MED ORDER — BD GETTING STARTED TAKE HOME KIT: 1/2ML X 30G SYRINGES
1.0000 | Freq: Once | Status: DC
  Filled 2011-08-03: qty 1

## 2011-08-03 MED ORDER — INSULIN ASPART 100 UNIT/ML ~~LOC~~ SOLN
0.0000 [IU] | Freq: Three times a day (TID) | SUBCUTANEOUS | Status: DC
  Administered 2011-08-03 – 2011-08-04 (×2): 5 [IU] via SUBCUTANEOUS
  Administered 2011-08-04: 8 [IU] via SUBCUTANEOUS
  Filled 2011-08-03 (×2): qty 3

## 2011-08-03 MED ORDER — INSULIN GLARGINE 100 UNIT/ML ~~LOC~~ SOLN
15.0000 [IU] | Freq: Every day | SUBCUTANEOUS | Status: DC
  Administered 2011-08-03: 15 [IU] via SUBCUTANEOUS
  Filled 2011-08-03 (×2): qty 3

## 2011-08-03 MED ORDER — POTASSIUM CHLORIDE CRYS ER 20 MEQ PO TBCR
40.0000 meq | EXTENDED_RELEASE_TABLET | Freq: Once | ORAL | Status: AC
  Administered 2011-08-03: 40 meq via ORAL
  Filled 2011-08-03: qty 2

## 2011-08-03 NOTE — Progress Notes (Signed)
SPOKE WITH PT WHO IS INTERESTED IN HELP WITH FINDING A DOCTOR AND MEDICAL ASSISTANCE.  HEALTH SERVE APPT MADE FOR [facility] ON FEB 25TH AT [id number] AND A DOC APPT WITH DR Clelia Croft ON April 12TH AT 215PM. [name] 08/03/2011 [phone] OR [phone]

## 2011-08-03 NOTE — Progress Notes (Signed)
[facility]  TEAM 8  Subjective: 51 years old male with no significant past medical history, who drinks alcohol everyday for many years who presented to the ER because of persistent abdominal pain over the last 3 days.   He states today that his abdominal pain has resolved but he has some soreness now. No nausea or vomiting. I have counseled him on his drinking and his diet and we have had a long discussed about the changes he will make.   Objective:  Intake/Output Summary (Last 24 hours) at 08/03/11 1019 Last data filed at 08/03/11 0800  Gross per 24 hour  Intake 1024.73 ml  Output   2301 ml  Net -1276.27 ml   Blood pressure 131/81, pulse 108, temperature 98.3 F (36.8 C), temperature source Oral, resp. rate 17, height 5\' 7"  (1.702 m), weight 97.7 kg (215 lb 6.2 oz), SpO2 97.00%.  Physical Exam: General: No acute respiratory distress Lungs: Clear to auscultation bilaterally without wheezes or crackles Cardiovascular: regular rhythm without murmur gallop or rub Abdomen: nontender, no rebound, no focal mass appreciable, soft/not tense Extremities:no edema,cyanosis or clubbing Neuro:  A&O x4  Lab Results:  Basename 08/03/11 0406 08/02/11 0342 08/01/11 2230  NA 138 129* 129*  K 3.2* 3.7 3.2*  CL 104 99 98  CO2 22 21 18*  GLUCOSE 191* 175* 196*  BUN 10 14 14   CREATININE 0.51 0.62 0.61  CALCIUM 8.8 8.1* 7.9*  MG -- 1.6 --  PHOS -- -- --    Basename 08/03/11 0406 08/01/11 1301  AST 13 18  ALT 18 46  ALKPHOS 58 79  BILITOT 0.8 1.3*  PROT 6.3 7.9  ALBUMIN 2.5* 3.8    Basename 08/03/11 0406 08/02/11 0342 08/01/11 2100 08/01/11 1301  WBC 9.8 13.0* 13.5* --  NEUTROABS -- -- -- 12.5*  HGB 14.2 14.8 16.9 --  HCT 39.3 41.5 44.8 --  MCV 92.9 93.0 91.4 --  PLT 125* 144* 149* --    Basename 08/02/11 1235 08/02/11 0342 08/01/11 2100  CKTOTAL 26 30 39  CKMB 1.3 1.5 1.6  CKMBINDEX -- -- --  TROPONINI <0.30 <0.30 <0.30   Micro Results: Recent Results  (from the past 240 hour(s))  MRSA PCR SCREENING     Status: Normal   Collection Time   08/01/11  8:15 PM      Component Value Range Status Comment   MRSA by PCR NEGATIVE  NEGATIVE  Final    Studies/Results: All recent x-ray/radiology reports have been reviewed in detail.   Medications: I have reviewed the patient's complete medication list.  Assessment/Plan:  Acute alcoholic pancreatitis  Will start clear liquids today.   FUO CT scan of abdom without evidence of acute complications/necrosis - likely due simply to inflammation associated w/ pancreatitis - given extent of temp elevation, emperic GI coverage is felt to be prudent  EtOH abuse/alcoholism I have counseled the patient on the absolute need for abstinence, and the direct connection between his illness and his EtOH abuse - he voices understanding and reports that he will quite drinking -   Uncontrolled DM/Metabolic acidosis/early DKA The pt denies a prior hx of DM - this could be a poor prognostic sign in regard to his pancreatitis -- follow CBGs and A1c  Hypertriglyceridemia It is unclear if this has a cause or effect relationship to his pancreatitis - will need to recheck once his sx are much improved and the pt is in a true fasting state  Calvert Cantor, MD [facility] Office  [phone] Pager [phone]  On-Call/Text Page:      [email or web address]      password [facility]

## 2011-08-03 NOTE — Plan of Care (Signed)
Problem: Food- and Nutrition-Related Knowledge Deficit (NB-1.1) Goal: Nutrition education Formal process to instruct or train a patient/client in a skill or to impart knowledge to help patients/clients voluntarily manage or modify food choices and eating behavior to maintain or improve health.  Outcome: Completed/Met Date Met:  08/03/11 Discussed CHO-counting, basic diabetes diet guidelines, and portion sizes with patient.  Encouraged protein with each meal.  Patient was very receptive; able to verbalize understanding of information discussed.  Handouts provided.  Recommend outpatient diabetes diet education.

## 2011-08-03 NOTE — Progress Notes (Signed)
Patient received from 3300 unit. Skin intact. Assessment complete. No complaints of pain. Oriented to room and surroundings. Patient states he has an understanding of all details covered.

## 2011-08-03 NOTE — Progress Notes (Addendum)
Pt with new onset diabetes.  A1C 10.7% (08/03/11).  Admitted with Pancreatitis.  Patient with history of ETOH.  Spoke with pt about new diagnosis.  Discussed A1C results with him and explained what an A1C is, basic pathophysiology of DM Type 2, basic home care, importance of checking CBGs and maintaining good CBG control to prevent long-term and short-term complications.  Reviewed signs and symptoms of hyperglycemia and hypoglycemia.  RNs to provide ongoing basic DM education at bedside with this patient.  Have ordered educational booklet, insulin starter kit, and DM videos.  Have asked RNs to please teach patient vial and syringe method of insulin admin. for ease of affordability as patient has no health insurance.    Patient will see MD at [facility] clinic on 10/08/11 and however, he will not be able to get any medications through [facility] until 10/08/11 once he's seen the MD at the clinic.  Lantus and Novolog will not be an affordable option for patient to go home on.  MD, if you decide to send patient home on insulin, please prescribe a more affordable insulin for this patient.  NPH & Regular insulin OR 70/30 insulin can be purchased at [facility] for $24.88 per vial.  These 3 insulins are not available in pen form.  MD, if you send patient home on any of these insulins, please write prescriptions for "Humulin Reli-On brand of NPH, Regular, or 70/30".    Gave patient information on obtaining an affordable CBG meter at [facility].  Meter can be purchased for $16 and a box of 50 strips for $9.    MD- Please place order for "Consult Diabetes OP Education" so pt may attend follow-up session with Certified Diabetes Educator after d/c at the [facility].  May want to go ahead and covert patient to 70/30 insulin while in hospital.     Will follow and assist as needed.  Thank you! Ambrose Finland RN, MSN, CDE Diabetes Coordinator Inpatient Diabetes  Program [phone]

## 2011-08-04 LAB — BASIC METABOLIC PANEL
CO2: 25 mEq/L (ref 19–32)
Chloride: 98 mEq/L (ref 96–112)
Creatinine, Ser: 0.53 mg/dL (ref 0.50–1.35)
Glucose, Bld: 187 mg/dL — ABNORMAL HIGH (ref 70–99)

## 2011-08-04 LAB — GLUCOSE, CAPILLARY
Glucose-Capillary: 284 mg/dL — ABNORMAL HIGH (ref 70–99)
Glucose-Capillary: 210 mg/dL — ABNORMAL HIGH (ref 70–99)

## 2011-08-04 MED ORDER — ZOLPIDEM TARTRATE 5 MG PO TABS
5.0000 mg | ORAL_TABLET | Freq: Every evening | ORAL | Status: DC | PRN
  Administered 2011-08-04: 5 mg via ORAL
  Filled 2011-08-04: qty 1

## 2011-08-04 MED ORDER — INSULIN ASPART PROT & ASPART (70-30 MIX) 100 UNIT/ML ~~LOC~~ SUSP: 18.0000 [IU] | Freq: Two times a day (BID) | SUBCUTANEOUS | Status: DC

## 2011-08-04 MED ORDER — CIPROFLOXACIN HCL 500 MG PO TABS
500.0000 mg | ORAL_TABLET | Freq: Two times a day (BID) | ORAL | Status: DC
  Administered 2011-08-04 – 2011-08-05 (×3): 500 mg via ORAL
  Filled 2011-08-04 (×5): qty 1

## 2011-08-04 MED ORDER — BD GETTING STARTED TAKE HOME KIT: 1/2ML X 30G SYRINGES: 1.0000 | Freq: Once | Status: DC

## 2011-08-04 MED ORDER — INSULIN ASPART PROT & ASPART (70-30 MIX) 100 UNIT/ML ~~LOC~~ SUSP
18.0000 [IU] | Freq: Two times a day (BID) | SUBCUTANEOUS | Status: DC
  Administered 2011-08-04 – 2011-08-05 (×2): 18 [IU] via SUBCUTANEOUS
  Filled 2011-08-04: qty 3

## 2011-08-04 MED ORDER — OXYCODONE HCL 5 MG PO TABS: 5.0000 mg | ORAL_TABLET | ORAL | Status: DC | PRN

## 2011-08-04 MED ORDER — POTASSIUM CHLORIDE CRYS ER 20 MEQ PO TBCR
40.0000 meq | EXTENDED_RELEASE_TABLET | Freq: Once | ORAL | Status: AC
  Administered 2011-08-04: 40 meq via ORAL
  Filled 2011-08-04: qty 2

## 2011-08-04 MED ORDER — INSULIN ASPART 100 UNIT/ML ~~LOC~~ SOLN
0.0000 [IU] | Freq: Every day | SUBCUTANEOUS | Status: DC
  Administered 2011-08-04: 2 [IU] via SUBCUTANEOUS

## 2011-08-04 MED ORDER — INSULIN ASPART 100 UNIT/ML ~~LOC~~ SOLN
0.0000 [IU] | Freq: Three times a day (TID) | SUBCUTANEOUS | Status: DC
  Administered 2011-08-04: 2 [IU] via SUBCUTANEOUS
  Administered 2011-08-05 (×2): 3 [IU] via SUBCUTANEOUS
  Filled 2011-08-04: qty 3

## 2011-08-04 MED ORDER — CIPROFLOXACIN HCL 500 MG PO TABS: 500.0000 mg | ORAL_TABLET | Freq: Two times a day (BID) | ORAL | Status: DC

## 2011-08-04 MED ORDER — ROSUVASTATIN CALCIUM 5 MG PO TABS
5.0000 mg | ORAL_TABLET | Freq: Every day | ORAL | Status: DC
  Filled 2011-08-04: qty 1

## 2011-08-04 NOTE — Progress Notes (Signed)
Patient ID: Kyle Barber, male   DOB: March 11, 1961, 51 y.o.   MRN: [id number]   Subjective: Has small amount of epigastric burning.  Asking for solid food.    Objective:  Intake/Output Summary (Last 24 hours) at 08/04/11 1113 Last data filed at 08/04/11 0605  Gross per 24 hour  Intake      0 ml  Output   1550 ml  Net  -1550 ml   Blood pressure 126/82, pulse 96, temperature 98.7 F (37.1 C), temperature source Oral, resp. rate 20, height 5\' 7"  (1.702 m), weight 97.7 kg (215 lb 6.2 oz), SpO2 96.00%.  Physical Exam: General: No acute respiratory distress Lungs: Clear to auscultation bilaterally without wheezes or crackles Cardiovascular: regular rhythm without murmur gallop or rub Abdomen: slightly tender in LUQ, no rebound, no focal mass appreciable, soft/not tense Extremities:no edema,cyanosis or clubbing Neuro:  A&O x4  Lab Results:  Results for RANCE, SMITHSON (MRN [id number]) as of 08/04/2011 10:59  Ref. Range 08/02/2011 03:42  Cholesterol Latest Range: 0-200 mg/dL 213 (H)  Triglycerides Latest Range: <150 mg/dL 086 (H)  HDL Latest Range: >39 mg/dL 31 (L)  LDL (calc) Latest Range: 0-99 mg/dL UNABLE TO CALCULATE IF TRIGLYCERIDE OVER 400 mg/dL  VLDL Latest Range: 5-78 mg/dL UNABLE TO CALCULATE IF TRIGLYCERIDE OVER 400 mg/dL  Total CHOL/HDL Ratio No range found 7.1     Basename 08/04/11 0540 08/03/11 0406 08/02/11 0342  NA 136 138 129*  K 3.3* 3.2* 3.7  CL 98 104 99  CO2 25 22 21   GLUCOSE 187* 191* 175*  BUN 7 10 14   CREATININE 0.53 0.51 0.62  CALCIUM 9.0 8.8 8.1*  MG -- -- 1.6  PHOS -- -- --    Basename 08/03/11 0406 08/01/11 1301  AST 13 18  ALT 18 46  ALKPHOS 58 79  BILITOT 0.8 1.3*  PROT 6.3 7.9  ALBUMIN 2.5* 3.8    Basename 08/03/11 0406 08/02/11 0342 08/01/11 2100 08/01/11 1301  WBC 9.8 13.0* 13.5* --  NEUTROABS -- -- -- 12.5*  HGB 14.2 14.8 16.9 --  HCT 39.3 41.5 44.8 --  MCV 92.9 93.0 91.4 --  PLT 125* 144* 149* --    Basename 08/02/11 1235  08/02/11 0342 08/01/11 2100  CKTOTAL 26 30 39  CKMB 1.3 1.5 1.6  CKMBINDEX -- -- --  TROPONINI <0.30 <0.30 <0.30   Micro Results: Recent Results (from the past 240 hour(s))  CULTURE, BLOOD (ROUTINE X 2)     Status: Normal (Preliminary result)   Collection Time   08/01/11  3:25 PM      Component Value Range Status Comment   Specimen Description BLOOD RIGHT WRIST ARTERIAL   Final    Special Requests BOTTLES DRAWN AEROBIC AND ANAEROBIC 5CC EACH   Final    Culture  Setup Time [id number]   Final    Culture     Final    Value:        BLOOD CULTURE RECEIVED NO GROWTH TO DATE CULTURE WILL BE HELD FOR 5 DAYS BEFORE ISSUING A FINAL NEGATIVE REPORT   Report Status PENDING   Incomplete   CULTURE, BLOOD (ROUTINE X 2)     Status: Normal (Preliminary result)   Collection Time   08/01/11  3:45 PM      Component Value Range Status Comment   Specimen Description BLOOD LEFT FOREARM   Final    Special Requests [facility]   Final    Culture  Setup Time [id number]  Final    Culture     Final    Value:        BLOOD CULTURE RECEIVED NO GROWTH TO DATE CULTURE WILL BE HELD FOR 5 DAYS BEFORE ISSUING A FINAL NEGATIVE REPORT   Report Status PENDING   Incomplete   MRSA PCR SCREENING     Status: Normal   Collection Time   08/01/11  8:15 PM      Component Value Range Status Comment   MRSA by PCR NEGATIVE  NEGATIVE  Final    CT Abdomen / Pelvis:  08/01/11 IMPRESSION:  1. Moderate acute pancreatitis. No evidence of pancreatic necrosis, pseudocyst formation, or other complication.  2. Moderate hepatic steatosis.  3. Diverticulosis. No radiographic evidence of diverticulitis.  Assessment/Plan:  Acute alcoholic pancreatitis  Advance to small portions of low fat solids.   FUO CT scan of abdom without evidence of acute complications/necrosis - likely due simply to inflammation associated w/ pancreatitis - given extent of temp elevation, emperic Zosyn coverage is felt to be prudent (started 2/4) will change to  PO Cipro 08/04/11.  EtOH abuse/alcoholism I have counseled the patient on the absolute need for abstinence, and the direct connection between his illness and his EtOH abuse - he voices understanding and reports that he will quit drinking -   Uncontrolled DM/Metabolic acidosis/early DKA The pt denies a prior hx of DM - this could be a poor prognostic sign in regard to his pancreatitis -- follow CBGs and A1c.  Started patient on 70/30 18 units bid with meals per DM Coordinator rec.  Hypertriglyceridemia - secondary to alcoholism, DM.   Patient will need to follow with a PCP to consider medication if necessary after pancreatitis has resolved.  Disposition:  - Likely home tomorrow 08/05/11.  Patient is without insurance.  Has been given a list of potential medical doctors as potential PCPs.  [name], [place] [facility] [phone]

## 2011-08-04 NOTE — Progress Notes (Signed)
Chart reviewed. Patient interviewed and examined. Reviewed patient's lab data and imaging study reports. Discussed patient's case in detail with Ms. [name], PA and agree with her notes.  Patient indicates that he's feeling a whole lot better compared to that on admission. He gives 7-8 months history of polyuria/nocturia. He denies any dysuria, fever or chills on admission. Patient has tolerated advanced diet.  Patient lacks insurance. Will have the case manager [facility]. Also have changed his insulin to 70/30 insulin so he can afford the same. Outpatient diabetes education has been consulted. Although patient's triglycerides are high, they're not high enough to cause pancreatitis. We'll need to repeat his fasting lipid panel in a few weeks when his diabetes is better controlled and address appropriately. Patient does not need antibiotics for his pancreatitis since there are no complicating factors. Although not convinced that patient has a urinary tract infection, it's unusual to see many bacteria on urinalysis in a male patient. Thereby will for presumed urinary tract infection with a total weeks course of antibiotics.  Possible discharge tomorrow.  HONGALGI,ANAND 3:31 PM

## 2011-08-04 NOTE — Progress Notes (Signed)
.  Clinical social worker completed patient psychosocial assessment, please see assessment in patient shadow chart.  Pt and CSW discussed patient current substance abuse and completed SBIRT assessment. Pt stated he was not interested any substance abuse counseling or resources at this time. Pt feels he can remain sober through support of his girlfriend as well as the responsibility of his children and finding new employment. Pt stated his children are safe and have never been in danger. Pt stated he will reach out to the resources provided by CSW if needed. Pt agreed to contact csw while still in the hospital if he had questions or concerns, and would follow up with community resources if he needed further assistance. No further csw needs, sign off.   Catha Gosselin, Theresia Majors  [phone] .08/04/2011 12:26pm

## 2011-08-04 NOTE — Progress Notes (Signed)
Noted patient may be d/c'd tomorrow.  Spoke with patient at length yesterday (see DM Coordinator note from 08/03/11). Patient does not have insurance and will not be able to afford Lantus and Novolog.  Have asked RNs to please teach patient how to draw up and administer insulin via vial and syringe method.  Vials of insulin will be more affordable for pt until he gets in to [facility].  Recommend we send pt home on 70/30 insulin.  Based on his current CBG readings and his anticipated insulin need, recommend 70/30 insulin- 18 units bid with meals.  18 units bid 70/30 would be equivalent to ~ 25 units Lantus plus he'll have coverage at meals with the short- acting insulin in the 70/30.  MD- please make sure to write prescriptions for "Humulin Reli-on 70/30 insulin".  This insulin can be purchased at [facility] for $24.88 per vial.    Please also write Rx for insulin syringes.  MD- Please place order for "Consult Diabetes OP Education" so pt may attend follow-up session with Certified Diabetes Educator after d/c at the [facility].  Will follow Ambrose Finland RN, MSN, CDE Diabetes Coordinator Inpatient Diabetes Program [phone]

## 2011-08-05 DIAGNOSIS — E782 Mixed hyperlipidemia: Secondary | ICD-10-CM | POA: Diagnosis present

## 2011-08-05 DIAGNOSIS — K869 Disease of pancreas, unspecified: Secondary | ICD-10-CM | POA: Diagnosis present

## 2011-08-05 DIAGNOSIS — E1169 Type 2 diabetes mellitus with other specified complication: Secondary | ICD-10-CM | POA: Diagnosis present

## 2011-08-05 LAB — BASIC METABOLIC PANEL
BUN: 10 mg/dL (ref 6–23)
Calcium: 9.7 mg/dL (ref 8.4–10.5)
GFR calc non Af Amer: >90 mL/min (ref >90)
Glucose, Bld: 228 mg/dL — ABNORMAL HIGH (ref 70–99)
Sodium: 137 mEq/L (ref 135–145)

## 2011-08-05 LAB — GLUCOSE, CAPILLARY: Glucose-Capillary: 231 mg/dL — ABNORMAL HIGH (ref 70–99)

## 2011-08-05 MED ORDER — INSULIN ASPART PROT & ASPART (70-30 MIX) 100 UNIT/ML ~~LOC~~ SUSP: 20.0000 [IU] | Freq: Two times a day (BID) | SUBCUTANEOUS | Status: DC

## 2011-08-05 MED ORDER — FOLIC ACID 1 MG PO TABS: 1.0000 mg | ORAL_TABLET | Freq: Every day | ORAL | Status: AC

## 2011-08-05 MED ORDER — POTASSIUM CHLORIDE CRYS ER 20 MEQ PO TBCR
40.0000 meq | EXTENDED_RELEASE_TABLET | Freq: Once | ORAL | Status: AC
  Administered 2011-08-05: 40 meq via ORAL

## 2011-08-05 MED ORDER — THIAMINE HCL 100 MG PO TABS: 100.0000 mg | ORAL_TABLET | Freq: Every day | ORAL | Status: AC

## 2011-08-05 MED ORDER — POTASSIUM CHLORIDE CRYS ER 20 MEQ PO TBCR
40.0000 meq | EXTENDED_RELEASE_TABLET | Freq: Once | ORAL | Status: DC
  Filled 2011-08-05: qty 2

## 2011-08-05 NOTE — Progress Notes (Signed)
Kyle Barber to be D/C'd Home per MD order.  Discussed with the patient and all questions fully answered.   [name], [name]  Home Medication Instructions UJW:[id number]   Printed on:08/05/11 1336  Medication Information                    Multiple Vitamin (MULITIVITAMIN WITH MINERALS) TABS Take 1 tablet by mouth daily.           Coenzyme Q10 (CO Q 10) 100 MG CAPS Take 1 tablet by mouth daily.           bd getting started take home kit MISC 1 kit by Other route once.           folic acid (FOLVITE) 1 MG tablet Take 1 tablet (1 mg total) by mouth daily.           thiamine 100 MG tablet Take 1 tablet (100 mg total) by mouth daily.           insulin aspart protamine-insulin aspart (NOVOLOG MIX 70/30) (70-30) 100 UNIT/ML injection Inject 20 Units into the skin 2 (two) times daily with a meal.             VVS, Skin clean, dry and intact without evidence of skin break down, no evidence of skin tears noted. IV catheter discontinued intact. Site without signs and symptoms of complications. Dressing and pressure applied.  An After Visit Summary was printed and given to the patient. Patient escorted via WC, and D/C home via private auto.  Driggers, Rae Roam 08/05/2011 1:36 PM

## 2011-08-05 NOTE — Discharge Summary (Signed)
Patient ID: Kyle Barber MRN: [id number] DOB/AGE: [birth year] 51 y.o.  Admit date: 08/01/2011 Discharge date: 08/05/2011  Primary Care Physician:  No primary provider on file.  Discharge Diagnoses:    Present on Admission:  .Alcoholism *Acute pancreatitis  Diabetic ketoacidosis  Elevated triglycerides with high cholesterol  Diabetes mellitus associated with pancreatic disease   Medication List  As of 08/05/2011  1:47 PM   TAKE these medications         bd getting started take home kit Misc   1 kit by Other route once.      Co Q 10 100 MG Caps   Take 1 tablet by mouth daily.      folic acid 1 MG tablet   Commonly known as: FOLVITE   Take 1 tablet (1 mg total) by mouth daily.      insulin aspart protamine-insulin aspart (70-30) 100 UNIT/ML injection   Commonly known as: NOVOLOG 70/30   Inject 20 Units into the skin 2 (two) times daily with a meal.      mulitivitamin with minerals Tabs   Take 1 tablet by mouth daily.      thiamine 100 MG tablet   Take 1 tablet (100 mg total) by mouth daily.            Brief H and P: From the admission note:  51 year old male with no significant past medical history, who drinks alcohol everyday for last many years present to the ER because of persistent abdominal pain over the last 3 days. Patient states that 3 days ago pain started around epigastric area and it was like a burning sensation and he tried to take over-the-counter antacids which did not relieve his pain. The pain progressively worsened and became more diffuse and associated with nausea and vomiting when he decided to come to the ER. In the ER at med center [facility] patient was found to have a high lipase and high blood sugar with an anion gap. Patient had a CAT scan of the abdomen and pelvis which showed pancreatitis patient has been admitted for further management. Patient states earlier in the day he had mild shortness of breath but presently he is not short of breath denies  any chest pain dizziness loss of consciousness fever chills or diarrhea.  Plan at the time of admission:  #1. Acute pancreatitis - the most likely cause is alcohol. We will keep patient n.p.o. with aggressive IV hydration. Closely follow intake output and recheck labs in a.m. We'll also get a sonogram to check for the gallbladder and also check lipid panel particularly look for triglycerides. Patient will be placed on pain relief medications.  #2. Diabetic ketoacidosis - patient was not diagnosed with diabetes previously. He states he's been having weakness and increased thirst over the last few months. At this time patient is placed on IV insulin per DKA protocol. We will check frequent metabolic panel.  #3. Alcoholism - patient will be placed on alcohol withdrawal protocol. I have advised patient to quit drinking alcohol.  Hospital Course:  1.  Acute pancreatitis - With aggressive IV hydration and very slow re-initiation of oral intake the patient has done well.  Today his abdominal pain is minimal on no medications and he is eating a low fat solid diet.  He has been repeatedly counseled not to drink any alcohol.  2.  DKA and new onset diabetes - the patient was maintained on insulin after his anion gap closed.  Currently  he is on 70/30 20 units bid with meals.  We anticipate his insulin will need further adjustment going forward and have schedule an appointment with him at [facility].  The patient receive a nutrition consult and diabetic education.  He is also signed up for out patient on-going diabetic education.  3.  Alcoholism - the patient did not go thru with draw.  He has been strongly counseled not to drink alcohol and seems committed to abstinence.   4. High cholesterol and high triglycerides - likely the result of alcoholism.  We have requested that he follow up with [facility] regarding his cholesterol.  5. Hypokalemia - Repleted.  Prior to d/c the patient received two doses of 40 meq  of potassium.  6. Bacteria in urine - Mr. Deviney was treated with 3 days of antibiotic empirically for possible urinary tract infection.  Physical Exam on Discharge: General: Alert, awake, oriented x3, in no acute distress. HEENT: No bruits, no goiter. Heart: Regular rate and rhythm, without murmurs, rubs, gallops. Lungs: Clear to auscultation bilaterally. Abdomen: Soft, nontender, nondistended, positive bowel sounds. Extremities: No clubbing cyanosis or edema with positive pedal pulses. Neuro: Grossly intact, nonfocal.  Filed Vitals:   08/04/11 2237 08/05/11 0210 08/05/11 0559 08/05/11 1016  BP: 125/87 135/88 151/98 129/88  Pulse: 96 96 93 100  Temp: 99.1 F (37.3 C) 98.8 F (37.1 C) 98.3 F (36.8 C) 98.6 F (37 C)  TempSrc: Oral Oral Oral Oral  Resp: 18 20 20 20   Height:      Weight:      SpO2: 96% 96% 97% 96%     Intake/Output Summary (Last 24 hours) at 08/05/11 1341 Last data filed at 08/05/11 0900  Gross per 24 hour  Intake 5804.42 ml  Output    300 ml  Net 5504.42 ml    Basic Metabolic Panel:  Lab 08/05/11 1610 08/04/11 0540 08/02/11 0342  NA 137 136 --  K 3.0* 3.3* --  CL 95* 98 --  CO2 24 25 --  GLUCOSE 228* 187* --  BUN 10 7 --  CREATININE 0.56 0.53 --  CALCIUM 9.7 9.0 --  MG -- -- 1.6  PHOS -- -- --   Liver Function Tests:  Lab 08/03/11 0406 08/01/11 1301  AST 13 18  ALT 18 46  ALKPHOS 58 79  BILITOT 0.8 1.3*  PROT 6.3 7.9  ALBUMIN 2.5* 3.8    Lab 08/03/11 0406 08/02/11 0845  LIPASE 79* 204*  AMYLASE -- 62   No results found for this basename: AMMONIA:2 in the last 168 hours CBC:  Lab 08/03/11 0406 08/02/11 0342 08/01/11 1301  WBC 9.8 13.0* --  NEUTROABS -- -- 12.5*  HGB 14.2 14.8 --  HCT 39.3 41.5 --  MCV 92.9 93.0 --  PLT 125* 144* --   Cardiac Enzymes:  Lab 08/02/11 1235 08/02/11 0342 08/01/11 2100  CKTOTAL 26 30 39  CKMB 1.3 1.5 1.6  CKMBINDEX -- -- --  TROPONINI <0.30 <0.30 <0.30   BNP: No components found with this  basename: POCBNP:3 D-Dimer: No results found for this basename: DDIMER:2 in the last 168 hours CBG:  Lab 08/05/11 1154 08/05/11 0743 08/04/11 2235 08/04/11 2224 08/04/11 1608 08/04/11 1135  GLUCAP 245* 231* 202* 113* 185* 210*   Hemoglobin A1C:  Lab 08/03/11 0406  HGBA1C 10.7*   Fasting Lipid Panel:  Lab 08/02/11 0342  CHOL 219*  HDL 31*  LDLCALC UNABLE TO CALCULATE IF TRIGLYCERIDE OVER 400 mg/dL  TRIG 960*  CHOLHDL 7.1  LDLDIRECT --     Significant Diagnostic Studies:  Dg Chest 2 View  08/01/2011  *RADIOLOGY REPORT*  Clinical Data: Abdominal pain.  Chest pain.  Left arm pain and nausea.  CHEST - 2 VIEW   IMPRESSION: No active cardiopulmonary disease.    US Abdomen Complete  08/02/2011  *RADIOLOGY REPORT*  Clinical Data:  Pancreatitis  COMPLETE ABDOMINAL ULTRASOUND  Comparison:   IMPRESSION: Normal sonographic appearance of the gallbladder.  Pancreas is poorly visualized due to overlying bowel gas.  Hepatic steatosis.    Ct Abdomen Pelvis W Contrast  08/01/2011  *RADIOLOGY REPORT*  Clinical Data: Acute pancreatitis.  Leukocytosis.  Diffuse abdominal pain.  Nausea.  CT ABDOMEN AND PELVIS WITH CONTRAST    IMPRESSION:  1.  Moderate acute pancreatitis.  No evidence of pancreatic necrosis, pseudocyst formation, or other complication. 2. Moderate hepatic steatosis. 3.  Diverticulosis.  No radiographic evidence of diverticulitis.     Disposition and Follow-up: Stable for discharge to home with follow up at [facility].    Discharge Orders    Future Orders Please Complete By Expires   Diet - low sodium heart healthy      Increase activity slowly      Discharge instructions      Comments:   You must see a primary care physician regarding your diabetes and Pancreatitis.  Please follow up with [facility].  Because of your Pancreas you are absolutely NOT to drink any Alcohol or Beer.   We also strongly recommend not using tobacco.     Follow-up Information    Follow up with  Norberto Sorenson, MD on 10/08/2011. (AT 215 PM AT [facility])    Contact information:   [address] [place] [address] [phone]    Primary care physician to follow up on high triglycerides, new on-set diabetes, pancreatitis, electrolyte abnormalities, and history of alcohol and tobacco abuse.   Please follow up. ([facility] on 2/25 at 11:15)           Time spent on Discharge: 40 min.  SignedStephani Police 08/05/2011, 1:41 PM [phone]

## 2011-08-05 NOTE — Discharge Summary (Addendum)
Patient was interviewed and examined prior to discharge. Discussed case with Ms. [name] and agree with the discharge summary.  Admitting ABG results: PH 7.29, PCO2 28, PO2 88, bicarbonate 13.4 and oxygen saturation 96%. CMP on admission: Sodium 128, potassium 4, chloride 89, bicarbonate 14, BUN 15, creatinine 0.80, glucose 414, indirect bilirubin 1 and total bilirubin 1.3. Lipase on admission was 886. Cardiac enzymes were cycled and negative. Urine analysis showed 0-2 white blood cells and many bacteria. Patient however did not have any dysuria on admission. Blood cultures x2 are no growth to date.  Patient's lipid panel will have to be repeated in a few weeks' time and his glycemic control is better. The triglycerides were not high enough to be attributed as a cause for his pancreatitis which is most likely secondary to alcohol abuse.  Thrombocytopenia, also possibly secondary to alcohol abuse. No bleeding issues.   Fatty liver seen on imaging studies.   Kyle Barber 4:17 PM

## 2011-08-08 LAB — CULTURE, BLOOD (ROUTINE X 2)
Culture  Setup Time: 201302040836
Culture: NO GROWTH

## 2015-10-28 ENCOUNTER — Emergency Department (HOSPITAL_BASED_OUTPATIENT_CLINIC_OR_DEPARTMENT_OTHER): Payer: No Typology Code available for payment source

## 2015-10-28 ENCOUNTER — Emergency Department (HOSPITAL_BASED_OUTPATIENT_CLINIC_OR_DEPARTMENT_OTHER)
Admission: EM | Admit: 2015-10-28 | Discharge: 2015-10-28 | Disposition: A | Discharge status: Home / Self Care | Payer: No Typology Code available for payment source | Attending: Emergency Medicine | Admitting: Emergency Medicine

## 2015-10-28 ENCOUNTER — Encounter (HOSPITAL_BASED_OUTPATIENT_CLINIC_OR_DEPARTMENT_OTHER): Payer: Self-pay

## 2015-10-28 DIAGNOSIS — Z87891 Personal history of nicotine dependence: Secondary | ICD-10-CM | POA: Diagnosis not present

## 2015-10-28 DIAGNOSIS — M25511 Pain in right shoulder: Secondary | ICD-10-CM | POA: Diagnosis not present

## 2015-10-28 HISTORY — DX: Acute pancreatitis without necrosis or infection, unspecified: K85.90

## 2015-10-28 MED ORDER — ACETAMINOPHEN 325 MG PO TABS
650.0000 mg | ORAL_TABLET | Freq: Once | ORAL | Status: AC
  Administered 2015-10-28: 650 mg via ORAL
  Filled 2015-10-28: qty 2

## 2015-10-28 MED ORDER — NAPROXEN 250 MG PO TABS: 250.0000 mg | ORAL_TABLET | Freq: Two times a day (BID) | ORAL | Status: DC

## 2015-10-28 MED ORDER — METHOCARBAMOL 500 MG PO TABS: 500.0000 mg | ORAL_TABLET | Freq: Two times a day (BID) | ORAL | Status: DC | PRN

## 2015-10-28 NOTE — ED Notes (Signed)
Patient transported to X-ray 
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
## 2016-02-27 ENCOUNTER — Encounter: Payer: Self-pay | Admitting: Family Medicine

## 2016-02-27 ENCOUNTER — Ambulatory Visit (INDEPENDENT_AMBULATORY_CARE_PROVIDER_SITE_OTHER): Payer: 59 | Admitting: Family Medicine

## 2016-02-27 VITALS — BP 144/80 | HR 82 | Temp 97.7°F | Ht 68.0 in | Wt 212.4 lb

## 2016-02-27 DIAGNOSIS — IMO0001 Reserved for inherently not codable concepts without codable children: Secondary | ICD-10-CM

## 2016-02-27 DIAGNOSIS — J45909 Unspecified asthma, uncomplicated: Secondary | ICD-10-CM

## 2016-02-27 DIAGNOSIS — R0982 Postnasal drip: Secondary | ICD-10-CM

## 2016-02-27 DIAGNOSIS — Z114 Encounter for screening for human immunodeficiency virus [HIV]: Secondary | ICD-10-CM

## 2016-02-27 DIAGNOSIS — E119 Type 2 diabetes mellitus without complications: Secondary | ICD-10-CM | POA: Diagnosis not present

## 2016-02-27 DIAGNOSIS — J4530 Mild persistent asthma, uncomplicated: Secondary | ICD-10-CM

## 2016-02-27 DIAGNOSIS — E669 Obesity, unspecified: Secondary | ICD-10-CM | POA: Diagnosis not present

## 2016-02-27 DIAGNOSIS — R030 Elevated blood-pressure reading, without diagnosis of hypertension: Secondary | ICD-10-CM | POA: Diagnosis not present

## 2016-02-27 DIAGNOSIS — E1169 Type 2 diabetes mellitus with other specified complication: Secondary | ICD-10-CM

## 2016-02-27 DIAGNOSIS — Z1159 Encounter for screening for other viral diseases: Secondary | ICD-10-CM

## 2016-02-27 HISTORY — DX: Unspecified asthma, uncomplicated: J45.909

## 2016-02-27 LAB — LIPID PANEL
Cholesterol: 203 mg/dL — ABNORMAL HIGH (ref 0–200)
HDL: 30.6 mg/dL — AB (ref >39.00)
Total CHOL/HDL Ratio: 7
Triglycerides: 1928 mg/dL — ABNORMAL HIGH (ref 0.0–149.0)

## 2016-02-27 LAB — COMPREHENSIVE METABOLIC PANEL
ALK PHOS: 58 U/L (ref 39–117)
ALT: 42 U/L (ref 0–53)
AST: 31 U/L (ref 0–37)
Albumin: 4.6 g/dL (ref 3.5–5.2)
BILIRUBIN TOTAL: 0.5 mg/dL (ref 0.2–1.2)
BUN: 17 mg/dL (ref 6–23)
CALCIUM: 9.1 mg/dL (ref 8.4–10.5)
CO2: 24 meq/L (ref 19–32)
CREATININE: 0.86 mg/dL (ref 0.40–1.50)
Chloride: 103 mEq/L (ref 96–112)
GFR: 97.49 mL/min (ref >60.00)
GLUCOSE: 248 mg/dL — AB (ref 70–99)
Potassium: 3.8 mEq/L (ref 3.5–5.1)
Sodium: 135 mEq/L (ref 135–145)
TOTAL PROTEIN: 6.7 g/dL (ref 6.0–8.3)

## 2016-02-27 LAB — MICROALBUMIN / CREATININE URINE RATIO
Creatinine,U: 112.3 mg/dL
Microalb Creat Ratio: 0.6 mg/g (ref 0.0–30.0)
Microalb, Ur: <0.7 mg/dL (ref 0.0–1.9)

## 2016-02-27 LAB — HEPATITIS C ANTIBODY: HCV Ab: NEGATIVE

## 2016-02-27 LAB — LDL CHOLESTEROL, DIRECT: LDL DIRECT: 67 mg/dL

## 2016-02-27 LAB — HEMOGLOBIN A1C: Hgb A1c MFr Bld: 7.4 % — ABNORMAL HIGH (ref 4.6–6.5)

## 2016-02-27 LAB — HIV ANTIBODY (ROUTINE TESTING W REFLEX): HIV 1&2 Ab, 4th Generation: NONREACTIVE

## 2016-02-27 MED ORDER — BECLOMETHASONE DIPROPIONATE 40 MCG/ACT IN AERS: 2.0000 | INHALATION_SPRAY | Freq: Two times a day (BID) | RESPIRATORY_TRACT | 11 refills | Status: DC

## 2016-02-27 NOTE — Progress Notes (Signed)
Chief Complaint  Patient presents with  . Establish Care    Discuss DM and medication refills       New Patient Visit SUBJECTIVE: HPI: Kyle Barber is an 55 y.o.male who is being seen for establishing care.  The patient was previously seen at [facility].  Would like to discuss DM: Currently not taking anything. Monitors sugars around 1x/day. 180's fasting. Has not had a1c in years.  Not interested in a lot of medications. Is starting to improve diet.  Asthma Uses rescue inhaler nightly.  Was never given a maintenance inhaler.  No current SOB, cough or wheezing.  Allergies  Allergen Reactions  . Codeine Nausea Only    Past Medical History:  Diagnosis Date  . Asthma 02/27/2016  . Diabetes mellitus without complication ([place])   . Pancreatitis   . Premature ventricular contraction    Past Surgical History:  Procedure Laterality Date  . APPENDECTOMY    . HERNIA REPAIR     Social History   Social History   Social History Main Topics  . Smoking status: Former Smoker    Packs/day: 0.25    Years: 10.00    Types: Cigarettes    Quit date: 07/01/2011  . Smokeless tobacco: Former Systems developer    Quit date: 06/29/2011  . Alcohol use Yes     Comment: weekly  . Drug use: No   Family History  Problem Relation Age of Onset  . Emphysema Mother   . Diabetes Father   . Diabetes Paternal Grandmother      Current Outpatient Prescriptions:  .  albuterol (PROVENTIL HFA;VENTOLIN HFA) 108 (90 Base) MCG/ACT inhaler, Inhale into the lungs. Inhale 1-2 puffs every six hours as needed for wheezing., Disp: , Rfl:  .  Blood Glucose Monitoring Suppl (FIFTY50 GLUCOSE METER 2.0) w/Device KIT, Use as directed., Disp: , Rfl:  .  glucose blood test strip, Test blood sugars 2 times daily., Disp: , Rfl:  .  ReliOn Ultra Thin Lancets MISC, Test blood sugar 2 times daily., Disp: , Rfl:  .  beclomethasone (QVAR) 40 MCG/ACT inhaler, Inhale 2 puffs into the lungs 2 (two) times daily., Disp:  1 Inhaler, Rfl: 11 .  insulin aspart protamine-insulin aspart (NOVOLOG MIX 70/30) (70-30) 100 UNIT/ML injection, Inject 20 Units into the skin 2 (two) times daily with a meal., Disp: 10 mL, Rfl: 2  ROS Cardiovascular: Denies chest pain or pressure, palpitations  Respiratory: Denies dyspnea, cough   OBJECTIVE: BP (!) 144/80 (BP Location: Left Arm, Patient Position: Sitting, Cuff Size: Normal)   Pulse 82   Temp 97.7 F (36.5 C) (Oral)   Ht 5' 8"  (1.727 m)   Wt 212 lb 6.4 oz (96.3 kg)   SpO2 98%   BMI 32.30 kg/m   Constitutional: -  VS reviewed -  Well developed, well nourished, appears stated age -  No apparent distress  Psychiatric: -  Oriented to person, place, and time -  Memory intact -  Affect and mood normal -  Fluent conversation, good eye contact -  Judgment and insight age appropriate  Eye: -  Conjunctivae clear, no discharge -  Pupils symmetric, round, reactive to light  ENMT: -  Ears are patent b/l without erythema or discharge. TM's are shiny and clear b/l without evidence of effusion or infection. -  Oral mucosa without lesions, tongue and uvula midline    Tonsils not enlarged, no erythema, no exudate, trachea midline    Pharynx moist, no lesions, no erythema  Neck: -  No gross swelling, no palpable masses -  Thyroid midline, not enlarged, mobile, no palpable masses  Cardiovascular: -  RRR, no murmurs -  No LE edema  Respiratory: -  Normal respiratory effort, no accessory muscle use, no retraction -  Breath sounds equal, no wheezes, no ronchi, no crackles  Musculoskeletal: -  No clubbing, no cyanosis -  Gait normal  Skin: -  No significant lesion on inspection -  Warm and dry to palpation   ASSESSMENT/PLAN: Diabetes mellitus type 2 in obese (HCC) - Plan: HgB A1c, Lipid panel, Comprehensive metabolic panel, Microalbumin / creatinine urine ratio  Asthma, mild persistent, uncomplicated - Plan: beclomethasone (QVAR) 40 MCG/ACT inhaler  Elevated blood  pressure  Post-nasal drip  Need for hepatitis C screening test - Plan: Hepatitis C Antibody  Screening for HIV (human immunodeficiency virus) - Plan: HIV antibody  Patient instructed to sign release of records form from his previous PCP. Will await labs before making medication/lifestyle recommendations. Start Qvar BID for asthma.  Patient should return in 4 weeks for a dedicated DM visit and in 2 mo for a cpx. Will need eye exam, foot exam, and to make sure his immunizations are UTD. The patient voiced understanding and agreement to the plan.   Crosby Oyster [place]

## 2016-02-27 NOTE — Progress Notes (Signed)
Pre visit review using our clinic review tool, if applicable. No additional management support is needed unless otherwise documented below in the visit note. 

## 2016-02-27 NOTE — Patient Instructions (Addendum)
Claritin (loratadine), Allegra (fexofenadine), Zyrtec (cetirizine)  These are listed in order of weakest to strongest.  Flonase (fluticasone)  There are available OTC, and the generic versions, which may be cheaper, are in parentheses. Show this to a pharmacist if you have trouble finding any of these items.  Keep a log of your blood sugars and bring them to your next visit.

## 2016-03-29 ENCOUNTER — Ambulatory Visit: Payer: 59 | Admitting: Family Medicine

## 2016-04-27 ENCOUNTER — Telehealth: Payer: Self-pay | Admitting: Family Medicine

## 2016-04-27 NOTE — Telephone Encounter (Signed)
Patient needs to have beclomethasone (QVAR) 40 MCG/ACT [facility] [address] from now on instead. He would also like to have a 90 day prescription from now on as well and would like to have refills on the medication so he doesn't have to call to have it refilled every 3 months. Patient states that he does not need the medication now, but would like to have it ready for when he does. Please advise.    Phone: [phone]

## 2016-04-27 NOTE — Telephone Encounter (Signed)
LVMOM for pt to return my call to confirm if he wants to cancel out current pharmacy and replace it with new one or if he is going to be using both. TL/CMA

## 2016-04-27 NOTE — Telephone Encounter (Signed)
I have spoken with patient and have [facility] [address] to his list. Pt has 11 refils left on Qvar and just wanted to add [facility] in for future reference. I informed patient that if he request a refill then he would need to specify which pharmacy he would want to sen Rx too. FYI only. TL/CMA

## 2016-04-27 NOTE — Telephone Encounter (Signed)
Pt returned call. He says that he may be using both pharmacies in the future, depending on the Rx. Pt says for this Rx he would like to [facility] [address].

## 2016-05-10 ENCOUNTER — Ambulatory Visit (INDEPENDENT_AMBULATORY_CARE_PROVIDER_SITE_OTHER): Payer: 59 | Admitting: Family Medicine

## 2016-05-10 ENCOUNTER — Encounter: Payer: Self-pay | Admitting: Family Medicine

## 2016-05-10 VITALS — BP 116/80 | HR 77 | Temp 98.0°F | Ht 68.0 in | Wt 216.0 lb

## 2016-05-10 DIAGNOSIS — E781 Pure hyperglyceridemia: Secondary | ICD-10-CM

## 2016-05-10 DIAGNOSIS — Z0000 Encounter for general adult medical examination without abnormal findings: Secondary | ICD-10-CM

## 2016-05-10 DIAGNOSIS — R0789 Other chest pain: Secondary | ICD-10-CM

## 2016-05-10 DIAGNOSIS — Z1211 Encounter for screening for malignant neoplasm of colon: Secondary | ICD-10-CM | POA: Diagnosis not present

## 2016-05-10 LAB — LIPID PANEL
Cholesterol: 195 mg/dL (ref 0–200)
HDL: 33.8 mg/dL — ABNORMAL LOW (ref >39.00)
Total CHOL/HDL Ratio: 6

## 2016-05-10 LAB — LDL CHOLESTEROL, DIRECT: Direct LDL: 78 mg/dL

## 2016-05-10 NOTE — Progress Notes (Signed)
Chief Complaint  Patient presents with  . Annual Exam    Pt states that he is having chest pain in one particular spot    Well Male LORENSO Barber is here for a complete physical.   His last physical was >1 year(s) ago.  Current diet: in general, a "healthy" diet   Current exercise: no dedicated exercise Weight trend: stable Does pt snore? No. Not as much anymore. Daytime fatigue? No. Seat belt? Yes Concerns: chest pain on L side that is sharp and does not radiate. Comes and goes over the past 1 mo, not exertional. Associated with upper body movements. Denies arm pain, jaw pain, sob, numbness or tingling.  Past Medical History:  Diagnosis Date  . Asthma 02/27/2016  . Diabetes mellitus without complication (Kyle Barber)   . Pancreatitis   . Premature ventricular contraction     Past Surgical History:  Procedure Laterality Date  . APPENDECTOMY    . HERNIA REPAIR     Medications  Current Outpatient Prescriptions on File Prior to Visit  Medication Sig Dispense Refill  . albuterol (PROVENTIL HFA;VENTOLIN HFA) 108 (90 Base) MCG/ACT inhaler Inhale into the lungs. Inhale 1-2 puffs every six hours as needed for wheezing.    . beclomethasone (QVAR) 40 MCG/ACT inhaler Inhale 2 puffs into the lungs 2 (two) times daily. 1 Inhaler 11  . Blood Glucose Monitoring Suppl (FIFTY50 GLUCOSE METER 2.0) w/Device KIT Use as directed.    [name] glucose blood test strip Test blood sugars 2 times daily.    . insulin aspart protamine-insulin aspart (NOVOLOG MIX 70/30) (70-30) 100 UNIT/ML injection Inject 20 Units into the skin 2 (two) times daily with a meal. 10 mL 2  . ReliOn Ultra Thin Lancets MISC Test blood sugar 2 times daily.     Allergies Allergies  Allergen Reactions  . Codeine Nausea Only   Family History Family History  Problem Relation Age of Onset  . Emphysema Mother   . Diabetes Father   . Diabetes Paternal Grandmother     Review of Systems: Constitutional:  no unexpected change in weight, no  fevers or chills Eye:  no recent significant change in vision Ear/Nose/Mouth/Throat:  Ears:  no tinnitus or hearing loss Nose/Mouth/Throat:  no complaints of nasal congestion or bleeding, no sore throat and oral sores Cardiovascular:  +chest pain, no palpitations Respiratory:  no cough and no shortness of breath Gastrointestinal:  no abdominal pain, no change in bowel habits, no nausea, vomiting, diarrhea, or constipation and no black or bloody stool GU:  Male: negative for dysuria, frequency, and incontinence and negative for prostate symptoms Musculoskeletal/Extremities:  no pain, redness, or swelling of the joints Integumentary (Skin/Breast):  no abnormal skin lesions reported Neurologic:  no headaches, no numbness, tingling Endocrine:  weight changes, masses in the neck, heat/cold intolerance, bowel or skin changes, or cardiovascular system symptoms Hematologic/Lymphatic:  no abnormal bleeding, no HIV risk factors, no night sweats, no swollen nodes, no weight loss  Exam BP 116/80 (BP Location: Left Arm, Patient Position: Sitting, Cuff Size: Large)   Pulse 77   Temp 98 F (36.7 C) (Oral)   Ht 5' 8"  (1.727 m)   Wt 216 lb (98 kg)   SpO2 96%   BMI 32.84 kg/m  General:  well developed, well nourished, in no apparent distress Skin:  no significant moles, warts, or growths Head:  no masses, lesions, or tenderness Eyes:  pupils equal and round, sclera anicteric without injection Ears:  canals without lesions, TMs shiny  without retraction, no obvious effusion, no erythema Nose:  nares patent, septum midline, mucosa normal Throat/Pharynx:  lips and gingiva without lesion; tongue and uvula midline; non-inflamed pharynx; no exudates or postnasal drainage Neck: neck supple without adenopathy, thyromegaly, or masses Lungs:  clear to auscultation, breath sounds equal bilaterally, no respiratory distress Cardio:  regular rate and rhythm without murmurs, heart sounds without clicks or  rubs Thorax: TTP over the left anterior chest wall at R3-5. Abdomen:  abdomen soft, nontender; bowel sounds normal; no masses or organomegaly Genital (male): circumcised penis, no lesions or discharge; testes present bilaterally without masses or tenderness Rectal: Deferred Musculoskeletal:  symmetrical muscle groups noted without atrophy or deformity Extremities:  no clubbing, cyanosis, or edema, no deformities, no skin discoloration Neuro:  gait normal; deep tendon reflexes normal and symmetric Psych: well oriented with normal range of affect and appropriate judgment/insight  Assessment and Plan  Well adult exam  Chest wall pain  Hypertriglyceridemia - Plan: Lipid panel  Colon cancer screening - Plan: Ambulatory referral to Gastroenterology   Well 55 y.o. male. Counseled on diet and exercise. Immunizations, labs, and further orders as above. Follow up in early Dec for DM. The patient voiced understanding and agreement to the plan.  [place], DO 05/10/16 9:07 AM

## 2016-05-10 NOTE — Progress Notes (Signed)
Pre visit review using our clinic review tool, if applicable. No additional management support is needed unless otherwise documented below in the visit note. 

## 2016-06-02 ENCOUNTER — Ambulatory Visit: Payer: 59 | Admitting: Family Medicine

## 2016-06-09 ENCOUNTER — Encounter: Payer: Self-pay | Admitting: Family Medicine

## 2016-07-14 ENCOUNTER — Ambulatory Visit: Payer: 59 | Admitting: Family Medicine

## 2016-08-23 ENCOUNTER — Ambulatory Visit (INDEPENDENT_AMBULATORY_CARE_PROVIDER_SITE_OTHER): Payer: 59 | Admitting: Family Medicine

## 2016-08-23 ENCOUNTER — Ambulatory Visit: Payer: 59 | Admitting: Family Medicine

## 2016-08-23 ENCOUNTER — Encounter: Payer: Self-pay | Admitting: Family Medicine

## 2016-08-23 VITALS — BP 161/103 | HR 77 | Temp 98.3°F | Ht 67.0 in | Wt 215.4 lb

## 2016-08-23 DIAGNOSIS — I10 Essential (primary) hypertension: Secondary | ICD-10-CM | POA: Diagnosis not present

## 2016-08-23 LAB — BASIC METABOLIC PANEL
BUN: 12 mg/dL (ref 6–23)
CALCIUM: 8.8 mg/dL (ref 8.4–10.5)
CO2: 28 mEq/L (ref 19–32)
CREATININE: 0.7 mg/dL (ref 0.40–1.50)
Chloride: 100 mEq/L (ref 96–112)
GFR: 123.41 mL/min (ref >60.00)
GLUCOSE: 202 mg/dL — AB (ref 70–99)
Potassium: 3.9 mEq/L (ref 3.5–5.1)
Sodium: 137 mEq/L (ref 135–145)

## 2016-08-23 MED ORDER — LISINOPRIL 20 MG PO TABS: 20.0000 mg | ORAL_TABLET | Freq: Every day | ORAL | 1 refills | Status: DC

## 2016-08-23 NOTE — Progress Notes (Signed)
Chief Complaint  Patient presents with  . Hypertension    Pt reports giving blood last tuesday and BP was 170/90;fingers swollen and heart palpitations and has h/o PVT    Subjective Kyle Barber is a 56 y.o. male who presents for BP concerns. He does not monitor home blood pressures. He gave blood and it was noted to be in the 170's. He is not taking any BP medications. He is not adhering to a healthy diet overall. He has been under more stress at work lately and is self medicating with alcohol. Current exercise: None   Past Medical History:  Diagnosis Date  . Asthma 02/27/2016  . Diabetes mellitus without complication ([place])   . Pancreatitis   . Premature ventricular contraction    Family History  Problem Relation Age of Onset  . Emphysema Mother   . Diabetes Father   . Diabetes Paternal Grandmother      Medications Current Outpatient Prescriptions on File Prior to Visit  Medication Sig Dispense Refill  . albuterol (PROVENTIL HFA;VENTOLIN HFA) 108 (90 Base) MCG/ACT inhaler Inhale into the lungs. Inhale 1-2 puffs every six hours as needed for wheezing.    . beclomethasone (QVAR) 40 MCG/ACT inhaler Inhale 2 puffs into the lungs 2 (two) times daily. 1 Inhaler 11  . Blood Glucose Monitoring Suppl (FIFTY50 GLUCOSE METER 2.0) w/Device KIT Use as directed.    [name] glucose blood test strip Test blood sugars 2 times daily.    . insulin aspart protamine-insulin aspart (NOVOLOG MIX 70/30) (70-30) 100 UNIT/ML injection Inject 20 Units into the skin 2 (two) times daily with a meal. 10 mL 2  . ReliOn Ultra Thin Lancets MISC Test blood sugar 2 times daily.     Allergies Allergies  Allergen Reactions  . Codeine Nausea Only    Review of Systems Cardiovascular: no chest pain Respiratory:  no shortness of breath  Exam BP (!) 161/103 (BP Location: Left Arm, Patient Position: Sitting, Cuff Size: Large)   Pulse 77   Temp 98.3 F (36.8 C) (Oral)   Ht 5' 7"  (1.702 m)   Wt 215 lb 6.4 oz  (97.7 kg)   SpO2 98%   BMI 33.74 kg/m  General:  well developed, well nourished, in no apparent distress Skin:  warm, no pallor or diaphoresis Eyes:  pupils equal and round, sclera anicteric without injection  Heart :RRR, no murmurs, no bruits, no LE edema Lungs:  clear to auscultation, no accessory muscle use Psych: well oriented with normal range of affect and appropriate judgment/insight  Essential hypertension - Plan: lisinopril (PRINIVIL,ZESTRIL) 20 MG tablet, Basic Metabolic Panel (BMET), Basic Metabolic Panel (BMET)  Orders as above. Counseled on diet and exercise. Keep home BP's and bring both monitor and log to next appt.  F/u in 1 mo. 1 week to recheck BMP. The patient voiced understanding and agreement to the plan.  [place], DO 08/23/16  1:44 PM

## 2016-08-23 NOTE — Progress Notes (Signed)
Pre visit review using our clinic review tool, if applicable. No additional management support is needed unless otherwise documented below in the visit note. 

## 2016-08-23 NOTE — Patient Instructions (Signed)
Around 3 times per week, check your blood pressure 4 times per day. Twice in the morning and twice in the evening. The readings should be at least one minute apart. Write down these values and bring them to your next nurse visit/appointment.  When you check your BP, make sure you have been doing something calm/relaxing 5 minutes prior to checking. Both feet should be flat on the floor and you should be sitting. Use your left arm and make sure it is in a relaxed position (on a table), and that the cuff is at the approximate level/height of your heart.  

## 2016-09-15 ENCOUNTER — Other Ambulatory Visit: Payer: Self-pay | Admitting: Family Medicine

## 2016-09-15 NOTE — Telephone Encounter (Signed)
Caller name: Relationship to patient: Self Can be reached: [phone] Pharmacy:  Reason for call: Patient states that he was supposed to be taking 1 tablet of Lisinopril per day but he has been taking 3 tablets in a 2 day period to increase the dosage to 30mg  per day. States he could not break the tablet so he has been taking 1 every 8 hours. Request refill.

## 2016-09-15 NOTE — Telephone Encounter (Signed)
Spoke with pt. He states BP continued to be elevated on BP monitor at home with 20mg  lisinopril once a day so he increased the dose.  He now alternates (1 tablet three times a day) 60mg  one day then 20mg  the next day.  He reports the following readings over the last week on this dosage:  163/97 156/93 131/93 134/96 136/97  Pt states he was unaware that he was supposed to schedule a nurse visit 1 week after last visit and has upcoming appt on 09/20/16 with PCP. Also notes that he has had body aches and diarrhea for the last 2 days.  Please advise?

## 2016-09-16 NOTE — Telephone Encounter (Signed)
We will likely make changes at upcoming appointment, I want him to take BP med as instructed until he sees me so things aren't clouded. Has option of drinking fluids and avoiding aggravating foods or go to urgent care. If someone has an appointment, can see them for just the diarrhea issue. TY.

## 2016-09-16 NOTE — Telephone Encounter (Signed)
Tried to reach pt left message on voicemail to call back.  

## 2016-09-17 NOTE — Telephone Encounter (Signed)
Spoke with pt, currently at work and unable to speak long. Voices understanding about lisinopril dose and will continue to just take 1 a day until he comes in for follow up and will wait to discuss diarrhea at that time.

## 2016-09-20 ENCOUNTER — Encounter: Payer: Self-pay | Admitting: Family Medicine

## 2016-09-20 ENCOUNTER — Ambulatory Visit (INDEPENDENT_AMBULATORY_CARE_PROVIDER_SITE_OTHER): Payer: 59 | Admitting: Family Medicine

## 2016-09-20 VITALS — BP 120/72 | HR 70 | Temp 97.9°F | Ht 67.0 in | Wt 216.8 lb

## 2016-09-20 DIAGNOSIS — I10 Essential (primary) hypertension: Secondary | ICD-10-CM

## 2016-09-20 NOTE — Patient Instructions (Addendum)
Keep checking your BP. Bring your blood pressure monitor to your next nurse appointment.

## 2016-09-20 NOTE — Progress Notes (Signed)
Pre visit review using our clinic review tool, if applicable. No additional management support is needed unless otherwise documented below in the visit note. 

## 2016-09-20 NOTE — Progress Notes (Signed)
Chief Complaint  Patient presents with  . Follow-up    on HTN    Subjective Kyle Barber is a 55 y.o. male who presents for hypertension follow up. He does monitor home blood pressures. Blood pressures ranging from 130-135's/90's on average. He is compliant with medications- Lisinopril 20 mg daily. Patient has these side effects of medication: none He is adhering to a healthy diet overall. Current exercise: Walking   Past Medical History:  Diagnosis Date  . Asthma 02/27/2016  . Diabetes mellitus without complication (HCC)   . Pancreatitis   . Premature ventricular contraction    Family History  Problem Relation Age of Onset  . Emphysema Mother   . Diabetes Father   . Diabetes Paternal Grandmother      Medications Current Outpatient Prescriptions on File Prior to Visit  Medication Sig Dispense Refill  . albuterol (PROVENTIL HFA;VENTOLIN HFA) 108 (90 Base) MCG/ACT inhaler Inhale into the lungs. Inhale 1-2 puffs every six hours as needed for wheezing.    . beclomethasone (QVAR) 40 MCG/ACT inhaler Inhale 2 puffs into the lungs 2 (two) times daily. 1 Inhaler 11  . Blood Glucose Monitoring Suppl (FIFTY50 GLUCOSE METER 2.0) w/Device KIT Use as directed.    . glucose blood test strip Test blood sugars 2 times daily.    . lisinopril (PRINIVIL,ZESTRIL) 20 MG tablet Take 1 tablet (20 mg total) by mouth daily. 30 tablet 1  . ReliOn Ultra Thin Lancets MISC Test blood sugar 2 times daily.     Allergies Allergies  Allergen Reactions  . Codeine Nausea Only   Review of Systems Cardiovascular: no chest pain Respiratory:  no shortness of breath  Exam BP 120/72 (BP Location: Left Arm, Patient Position: Sitting, Cuff Size: Normal)   Pulse 70   Temp 97.9 F (36.6 C) (Oral)   Ht 5' 7" (1.702 m)   Wt 216 lb 12.8 oz (98.3 kg)   SpO2 98%   BMI 33.96 kg/m  General:  well developed, well nourished, in no apparent distress Skin:  warm, no pallor or diaphoresis Eyes:  pupils equal and  round, sclera anicteric without injection Heart :RRR, no murmurs, no bruits, no LE edema Lungs:  clear to auscultation, no accessory muscle use Psych: well oriented with normal range of affect and appropriate judgment/insight  Essential hypertension  Keep Lisinopril at current 20 mg dose. Counseled on diet and exercise F/u in 2 weeks for BP nurse visit. Will bring BP monitor. The patient voiced understanding and agreement to the plan.  Nicholas Paul Wendling, DO 09/20/16  4:44 PM  

## 2016-10-08 ENCOUNTER — Other Ambulatory Visit: Payer: Self-pay | Admitting: Family Medicine

## 2016-10-08 DIAGNOSIS — I10 Essential (primary) hypertension: Secondary | ICD-10-CM

## 2016-10-08 NOTE — Telephone Encounter (Signed)
Rx sent to the pharmacy by e-script.//AB/CMA 

## 2016-10-13 ENCOUNTER — Ambulatory Visit (INDEPENDENT_AMBULATORY_CARE_PROVIDER_SITE_OTHER): Payer: 59 | Admitting: Family Medicine

## 2016-10-13 VITALS — BP 113/87 | HR 77

## 2016-10-13 DIAGNOSIS — I10 Essential (primary) hypertension: Secondary | ICD-10-CM | POA: Diagnosis not present

## 2016-10-13 MED ORDER — AMLODIPINE BESYLATE 5 MG PO TABS: 5.0000 mg | ORAL_TABLET | Freq: Every day | ORAL | 2 refills | Status: DC

## 2016-10-13 NOTE — Patient Instructions (Addendum)
Per Dr. Carmelia Roller: STOP taking the Lisinopril. START Amlodipine (Norvasc) 5 MG daily. Return in 2 weeks for an office visit with PCP.

## 2016-10-13 NOTE — Progress Notes (Signed)
Tickle in throat could be due to upper respiratory cough syndrome 2/2 allergies or possibly ACEi's interference with pulm bradykinin. Will stop given other symptoms and start Norvasc. Should probably get a new cuff. Agree with above.

## 2016-10-13 NOTE — Progress Notes (Signed)
Pre visit review using our clinic review tool, if applicable. No additional management support is needed unless otherwise documented below in the visit note.  Patient presents in clinic for a blood pressure check per OV note 09/20/16. He adheres to medication & current regimen. Patient reported leg soreness/cramps & an occasional tickle in his throat, which leads to coughing. PCP was made aware of these symptoms. Today's readings were as follow: BP 113/87 P 77 & 02 95%.  Per Dr. Carmelia Roller: STOP taking the Lisinopril. START Amlodipine (Norvasc) 5 MG daily. Return in 2 weeks for an office visit with PCP.  Informed patient of the provider's recommendations. He verbalized understanding. Patient will call the office at a later date to schedule his next appointment.

## 2017-01-03 ENCOUNTER — Other Ambulatory Visit: Payer: Self-pay | Admitting: Family Medicine

## 2017-01-03 MED ORDER — AMLODIPINE BESYLATE 5 MG PO TABS: 5.0000 mg | ORAL_TABLET | Freq: Every day | ORAL | 2 refills | Status: DC

## 2017-01-03 NOTE — Telephone Encounter (Signed)
Self   Refill request for amLODipine - 90 day supply   [facility] [address]

## 2017-01-03 NOTE — Telephone Encounter (Signed)
Refill done.  

## 2017-01-26 ENCOUNTER — Telehealth: Payer: Self-pay | Admitting: Family Medicine

## 2017-01-26 NOTE — Telephone Encounter (Signed)
Caller name: Dorna MaiSteven Doescher Relationship to patient:  self Can be reached: [phone]  Reason for call: Pt called in about bills for 08/23/16, 09/20/16, and 10/13/16. Pt paid copay of $30 for appts 08/23/16 and 09/20/16. He did not pay a copy for 10/13/16 because it was a nurse visit for blood pressure check. He states that he was told by Dr. Carmelia RollerWendling the nurse visit would be free. [facility] was [facility] to call his insurance. His insurance advised him that Dr. Carmelia RollerWendling is listed as an urgent care provider in their system and that is a $60 or copay. Pt states he has been billed for additional payment beyond the $30 primary care copay bc we billed Dr. Carmelia RollerWendling as a specialist. I advised pt [employer] may have the provided credentially incorrect in their system so when we file a claim they have him listed as a specialist incorrectly. Pt received a bill for 10/13/16 as he did not pay a copay at all and states he was told he didn't have to when seeing the nurse. Pt would like call back to resolve.

## 2017-02-15 NOTE — Telephone Encounter (Signed)
Left patient a message to call me back.

## 2017-02-15 NOTE — Telephone Encounter (Signed)
Thanks I have  informed the patient

## 2017-02-15 NOTE — Telephone Encounter (Signed)
Hey [name] can you take a look at this one. Patient has been billed at specialty level... Could we adjust the remain balance and have credentialing look into why Kyle Barber is listed a specialist with insurance? Or what would you recommend?  Thanks [employer]

## 2017-02-15 NOTE — Telephone Encounter (Signed)
I can't have adjustment made, I can ask billing to hold billing and will send over to credentialing and ask them to look into.  I will CC you on emails.    Hope that helps.  [name]

## 2017-06-24 ENCOUNTER — Other Ambulatory Visit: Payer: Self-pay | Admitting: Family Medicine

## 2017-06-24 DIAGNOSIS — J4530 Mild persistent asthma, uncomplicated: Secondary | ICD-10-CM

## 2017-07-07 LAB — HM DIABETES EYE EXAM

## 2018-03-27 ENCOUNTER — Telehealth: Payer: Self-pay | Admitting: Family Medicine

## 2018-03-27 MED ORDER — METFORMIN HCL 500 MG PO TABS: ORAL_TABLET | ORAL | 1 refills | Status: DC

## 2018-03-27 NOTE — Telephone Encounter (Signed)
Send to [employer]

## 2018-03-27 NOTE — Addendum Note (Signed)
Addended by: Radene Gunning on: 03/27/2018 02:51 PM   Modules accepted: Orders

## 2018-03-27 NOTE — Telephone Encounter (Signed)
Copied from CRM [id number]. Topic: General - Other >> Mar 27, 2018 12:34 PM Darletta Moll L wrote: Reason for CRM: Blood sugar has been around 200 and patient wants advice from [facility] or assistant before is cpe on 04/06/2018. Patient wanted to know if there was any treatment that would be recommended. Toes starting to feel numb and tingly even if he's fasting. Please advise.

## 2018-03-27 NOTE — Telephone Encounter (Signed)
Spoke to the patient and he would like to start metformin so send to

## 2018-03-27 NOTE — Telephone Encounter (Signed)
Metformin sent.

## 2018-03-27 NOTE — Telephone Encounter (Signed)
We can get the ball rolling with Metformin or can wait until visit to discuss. Up to him, but the medicine will help lower average sugars. TY.

## 2018-03-28 ENCOUNTER — Telehealth: Payer: Self-pay

## 2018-03-28 NOTE — Telephone Encounter (Signed)
PA initiated via Covermymeds; KEY: [id number]. Awaiting determination.

## 2018-03-28 NOTE — Telephone Encounter (Signed)
PA cancelled. Pt is using out of network pharmacy. Will need to use pharmacy that is in network w/ his insurance.

## 2018-03-30 ENCOUNTER — Other Ambulatory Visit: Payer: Self-pay | Admitting: Family Medicine

## 2018-03-30 MED ORDER — METFORMIN HCL 500 MG PO TABS: ORAL_TABLET | ORAL | 0 refills | Status: DC

## 2018-03-31 ENCOUNTER — Telehealth: Payer: Self-pay | Admitting: Family Medicine

## 2018-03-31 ENCOUNTER — Other Ambulatory Visit: Payer: Self-pay | Admitting: Family Medicine

## 2018-03-31 MED ORDER — AMLODIPINE BESYLATE 5 MG PO TABS: 5.0000 mg | ORAL_TABLET | Freq: Every day | ORAL | 2 refills | Status: DC

## 2018-03-31 MED ORDER — GLUCOSE BLOOD VI STRP: ORAL_STRIP | 5 refills | Status: DC

## 2018-03-31 NOTE — Telephone Encounter (Signed)
Request for refills:  Glucose blood test strips was prescribed by an historical provider.   Amlodipine 5 mg tab, prescription expired on 01/03/18.  Please review

## 2018-03-31 NOTE — Telephone Encounter (Signed)
Copied from CRM [id number]. Topic: Quick Communication - Rx Refill/Question >> Mar 31, 2018  2:06 PM Angela Nevin wrote: Medication: amLODipine (NORVASC) 5 MG tablet [[id number]]   Pt is requesting a refill of this medication stating he will be out tomorrow. Pt has an appointment on 10/10.    Has the patient contacted their pharmacy? Yes, advised to contact office.  Preferred Pharmacy (with phone number or street name):[id number] [place], [place] - [address] [phone] (Phone) [phone] (Fax)

## 2018-03-31 NOTE — Telephone Encounter (Signed)
Copied from CRM [id number]. Topic: Quick Communication - Rx Refill/Question >> Mar 31, 2018  2:10 PM Angela Nevin wrote: Medication: glucose blood test strip   Pt requesting glucose blood test strips sent to pharmacy.   Preferred Pharmacy (with phone number or street name): [facility] - [place], [place] - [address] [phone] (Phone) [phone] (Fax)

## 2018-04-04 ENCOUNTER — Other Ambulatory Visit: Payer: Self-pay | Admitting: Family Medicine

## 2018-04-05 MED ORDER — GLUCOSE BLOOD VI STRP: ORAL_STRIP | 5 refills | Status: DC

## 2018-04-05 NOTE — Telephone Encounter (Signed)
Pt called requesting the test strips now be re-sent to pharmacy below  [id number] [place], [place] - [address] [phone] (Phone) [phone] (Fax)

## 2018-04-05 NOTE — Addendum Note (Signed)
Addended by: Wilford Corner on: 04/05/2018 05:06 PM   Modules accepted: Orders

## 2018-04-06 ENCOUNTER — Encounter: Payer: Self-pay | Admitting: Family Medicine

## 2018-04-06 ENCOUNTER — Ambulatory Visit (INDEPENDENT_AMBULATORY_CARE_PROVIDER_SITE_OTHER): Payer: 59 | Admitting: Family Medicine

## 2018-04-06 VITALS — BP 128/88 | HR 81 | Temp 98.5°F | Ht 67.0 in | Wt 208.0 lb

## 2018-04-06 DIAGNOSIS — Z23 Encounter for immunization: Secondary | ICD-10-CM

## 2018-04-06 DIAGNOSIS — E1169 Type 2 diabetes mellitus with other specified complication: Secondary | ICD-10-CM | POA: Diagnosis not present

## 2018-04-06 DIAGNOSIS — Z1211 Encounter for screening for malignant neoplasm of colon: Secondary | ICD-10-CM | POA: Insufficient documentation

## 2018-04-06 DIAGNOSIS — Z0000 Encounter for general adult medical examination without abnormal findings: Secondary | ICD-10-CM | POA: Diagnosis not present

## 2018-04-06 DIAGNOSIS — E669 Obesity, unspecified: Secondary | ICD-10-CM | POA: Diagnosis not present

## 2018-04-06 MED ORDER — SEMAGLUTIDE(0.25 OR 0.5MG/DOS) 2 MG/1.5ML ~~LOC~~ SOPN: 0.5000 mg | PEN_INJECTOR | SUBCUTANEOUS | 2 refills | Status: DC

## 2018-04-06 MED ORDER — ALBUTEROL SULFATE HFA 108 (90 BASE) MCG/ACT IN AERS: 1.0000 | INHALATION_SPRAY | Freq: Four times a day (QID) | RESPIRATORY_TRACT | 2 refills | Status: DC | PRN

## 2018-04-06 NOTE — Patient Instructions (Addendum)
Give [place] 2-3 business days to get the results of your labs back.   If you do not hear anything about your referral in the next 1-2 weeks, call our office and ask for an update.  The new Shingrix vaccine (for shingles) is a 2 shot series. It can make people feel low energy, achy and almost like they have the flu for 48 hours after injection. Please plan accordingly when deciding on when to get this shot. Call our office for a nurse visit appointment to get this. The second shot of the series is less severe regarding the side effects, but it still lasts 48 hours.   Keep the diet clean and stay active.   Let us know if you need anything.

## 2018-04-06 NOTE — Progress Notes (Signed)
Pre visit review using our clinic review tool, if applicable. No additional management support is needed unless otherwise documented below in the visit note. 

## 2018-04-06 NOTE — Progress Notes (Signed)
Chief Complaint  Patient presents with  . Annual Exam    Well Male SHONE LEVENTHAL is here for a complete physical.   His last physical was >1 year ago.  Current diet: in general, a "healthy" diet. Is drinking nightly.  Current exercise: walking, kayaking Weight trend: stable Does pt snore? No. Daytime fatigue? No. Seat belt? Yes.    Health maintenance Shingrix- No Colonoscopy- No Tetanus- No HIV- Yes Hep C- Yes Prostate cancer screening- No   Past Medical History:  Diagnosis Date  . Asthma 02/27/2016  . Diabetes mellitus without complication ([place])   . Pancreatitis   . Premature ventricular contraction       Past Surgical History:  Procedure Laterality Date  . APPENDECTOMY    . HERNIA REPAIR      Medications  Current Outpatient Medications on File Prior to Visit  Medication Sig Dispense Refill  . albuterol (PROVENTIL HFA;VENTOLIN HFA) 108 (90 Base) MCG/ACT inhaler Inhale into the lungs. Inhale 1-2 puffs every six hours as needed for wheezing.    [name] amLODipine (NORVASC) 5 MG tablet Take 1 tablet (5 mg total) by mouth daily. 90 tablet 2  . amLODipine (NORVASC) 5 MG tablet TAKE 1 TABLET BY MOUTH EVERY DAY 90 tablet 0  . beclomethasone (QVAR) 40 MCG/ACT inhaler Inhale 2 puffs into the lungs 2 (two) times daily. 8.7 g 1  . Blood Glucose Monitoring Suppl (FIFTY50 GLUCOSE METER 2.0) w/Device KIT Use as directed.    [name] glucose blood test strip Test blood sugars 2 times daily. 100 each 5  . metFORMIN (GLUCOPHAGE) 500 MG tablet Week 1: 1 tab daily Week 2: 1 tab twice daily Week 3: 2 tabs in AM, 1 in evening Week 4: 2 tabs twice daily 120 tablet 0  . ReliOn Ultra Thin Lancets MISC Test blood sugar 2 times daily.     Allergies Allergies  Allergen Reactions  . Codeine Nausea Only    Family History Family History  Problem Relation Age of Onset  . Emphysema Mother   . Diabetes Father   . Diabetes Paternal Grandmother     Review of Systems: Constitutional:  no  fevers Eye:  no recent significant change in vision Ear/Nose/Mouth/Throat:  Ears:  no hearing loss Nose/Mouth/Throat:  no complaints of nasal congestion, no sore throat Cardiovascular:  no chest pain, no palpitations Respiratory:  no cough and no shortness of breath Gastrointestinal:  +abdominal pain, no change in bowel habits GU:  Male: negative for dysuria, frequency, and incontinence and negative for prostate symptoms Musculoskeletal/Extremities:  no pain, redness, or swelling of the joints Integumentary (Skin/Breast):  no abnormal skin lesions reported Neurologic:  no headaches Endocrine: No unexpected weight changes Hematologic/Lymphatic:  no abnormal bleeding  Exam BP 128/88 (BP Location: Left Arm, Patient Position: Sitting, Cuff Size: Normal)   Pulse 81   Temp 98.5 F (36.9 C) (Oral)   Ht _0  (1.702 m)   Wt 208 lb (94.3 kg)   SpO2 98%   BMI 32.58 kg/m  General:  well developed, well nourished, in no apparent distress Skin:  no significant moles, warts, or growths Head:  no masses, lesions, or tenderness Eyes:  pupils equal and round, sclera anicteric without injection Ears:  canals without lesions, TMs shiny without retraction, no obvious effusion, no erythema Nose:  nares patent, septum midline, mucosa normal Throat/Pharynx:  lips and gingiva without lesion; tongue and uvula midline; non-inflamed pharynx; no exudates or postnasal drainage Neck: neck supple without adenopathy, thyromegaly, or masses  Lungs:  clear to auscultation, breath sounds equal bilaterally, no respiratory distress Cardio:  regular rate and rhythm, no LE edema, no bruits Abdomen:  abdomen soft, nontender; bowel sounds normal; no masses or organomegaly Genital (male): circumcised penis, no lesions or discharge; testes present bilaterally without masses or tenderness Rectal: Deferred Musculoskeletal:  symmetrical muscle groups noted without atrophy or deformity Extremities:  no clubbing, cyanosis, or  edema, no deformities, no skin discoloration Neuro:  gait normal; deep tendon reflexes normal and symmetric Psych: well oriented with normal range of affect and appropriate judgment/insight  Assessment and Plan  Well adult exam - Plan: Comprehensive metabolic panel, CBC, Lipid panel  Diabetes mellitus type 2 in obese ([place]) - Plan: HM DIABETES FOOT EXAM, Hemoglobin A1c, Microalbumin / creatinine urine ratio, Semaglutide,0.25 or 0.5MG/DOS, (OZEMPIC, 0.25 OR 0.5 MG/DOSE,) 2 MG/1.5ML SOPN  Screen for colon cancer - Plan: Ambulatory referral to Gastroenterology  Need for tetanus booster - Plan: Tdap vaccine greater than or equal to 7yo IM  Need for influenza vaccination - Plan: Flu Vaccine QUAD 6+ mos PF IM (Fluarix Quad PF)   Well 57 y.o. male. Counseled on diet and exercise. Counseled on risks and benefits of prostate cancer screening with PSA. The patient agrees to forego testing.  Change Metformin to Ozempic.  PCV23 not available today, will call back for this.  Immunizations, labs, and further orders as above. Follow up in 3 mo or prn. The patient voiced understanding and agreement to the plan.  [place], DO 04/06/18 4:49 PM

## 2018-04-07 ENCOUNTER — Encounter: Payer: Self-pay | Admitting: Family Medicine

## 2018-04-07 LAB — CBC
HCT: 45.6 % (ref 39.0–52.0)
Hemoglobin: 16 g/dL (ref 13.0–17.0)
MCHC: 35.1 g/dL (ref 30.0–36.0)
MCV: 97.7 fl (ref 78.0–100.0)
Platelets: 214 10*3/uL (ref 150.0–400.0)
RBC: 4.67 Mil/uL (ref 4.22–5.81)
RDW: 12.5 % (ref 11.5–15.5)
WBC: 8.4 10*3/uL (ref 4.0–10.5)

## 2018-04-07 LAB — LDL CHOLESTEROL, DIRECT: Direct LDL: 61 mg/dL

## 2018-04-07 LAB — COMPREHENSIVE METABOLIC PANEL
ALK PHOS: 50 U/L (ref 39–117)
ALT: 61 U/L — AB (ref 0–53)
AST: 36 U/L (ref 0–37)
Albumin: 4.6 g/dL (ref 3.5–5.2)
BILIRUBIN TOTAL: 0.9 mg/dL (ref 0.2–1.2)
BUN: 12 mg/dL (ref 6–23)
CO2: 29 mEq/L (ref 19–32)
CREATININE: 0.82 mg/dL (ref 0.40–1.50)
Calcium: 9.6 mg/dL (ref 8.4–10.5)
Chloride: 101 mEq/L (ref 96–112)
GFR: 102.24 mL/min (ref >60.00)
GLUCOSE: 132 mg/dL — AB (ref 70–99)
Potassium: 4.1 mEq/L (ref 3.5–5.1)
SODIUM: 141 meq/L (ref 135–145)
TOTAL PROTEIN: 6.9 g/dL (ref 6.0–8.3)

## 2018-04-07 LAB — LIPID PANEL
Cholesterol: 166 mg/dL (ref 0–200)
HDL: 33.9 mg/dL — ABNORMAL LOW (ref >39.00)
Total CHOL/HDL Ratio: 5
Triglycerides: 555 mg/dL — ABNORMAL HIGH (ref 0.0–149.0)

## 2018-04-07 LAB — MICROALBUMIN / CREATININE URINE RATIO
Creatinine,U: 167.9 mg/dL
Microalb Creat Ratio: 1.3 mg/g (ref 0.0–30.0)
Microalb, Ur: 2.1 mg/dL — ABNORMAL HIGH (ref 0.0–1.9)

## 2018-04-07 LAB — HEMOGLOBIN A1C: HEMOGLOBIN A1C: 8.6 % — AB (ref 4.6–6.5)

## 2018-04-10 ENCOUNTER — Other Ambulatory Visit: Payer: Self-pay | Admitting: Family Medicine

## 2018-04-10 DIAGNOSIS — R7401 Elevation of levels of liver transaminase levels: Secondary | ICD-10-CM

## 2018-04-10 DIAGNOSIS — R740 Nonspecific elevation of levels of transaminase and lactic acid dehydrogenase [LDH]: Principal | ICD-10-CM

## 2018-04-19 ENCOUNTER — Other Ambulatory Visit: Payer: 59

## 2018-04-20 ENCOUNTER — Other Ambulatory Visit (INDEPENDENT_AMBULATORY_CARE_PROVIDER_SITE_OTHER): Payer: 59

## 2018-04-20 DIAGNOSIS — R740 Nonspecific elevation of levels of transaminase and lactic acid dehydrogenase [LDH]: Secondary | ICD-10-CM

## 2018-04-20 DIAGNOSIS — R7401 Elevation of levels of liver transaminase levels: Secondary | ICD-10-CM

## 2018-04-20 LAB — HEPATIC FUNCTION PANEL
ALK PHOS: 52 U/L (ref 39–117)
ALT: 42 U/L (ref 0–53)
AST: 29 U/L (ref 0–37)
Albumin: 4.3 g/dL (ref 3.5–5.2)
BILIRUBIN DIRECT: 0.1 mg/dL (ref 0.0–0.3)
BILIRUBIN TOTAL: 0.8 mg/dL (ref 0.2–1.2)
Total Protein: 6.6 g/dL (ref 6.0–8.3)

## 2018-05-28 ENCOUNTER — Other Ambulatory Visit: Payer: Self-pay | Admitting: Family Medicine

## 2018-05-28 DIAGNOSIS — E669 Obesity, unspecified: Principal | ICD-10-CM

## 2018-05-28 DIAGNOSIS — E1169 Type 2 diabetes mellitus with other specified complication: Secondary | ICD-10-CM

## 2018-07-07 ENCOUNTER — Encounter: Payer: Self-pay | Admitting: Family Medicine

## 2018-07-07 ENCOUNTER — Ambulatory Visit: Payer: 59 | Admitting: Family Medicine

## 2018-07-07 VITALS — BP 120/80 | HR 75 | Temp 99.0°F | Ht 67.0 in | Wt 201.0 lb

## 2018-07-07 DIAGNOSIS — Z23 Encounter for immunization: Secondary | ICD-10-CM | POA: Diagnosis not present

## 2018-07-07 DIAGNOSIS — Z1211 Encounter for screening for malignant neoplasm of colon: Secondary | ICD-10-CM | POA: Diagnosis not present

## 2018-07-07 DIAGNOSIS — E1169 Type 2 diabetes mellitus with other specified complication: Secondary | ICD-10-CM

## 2018-07-07 DIAGNOSIS — L989 Disorder of the skin and subcutaneous tissue, unspecified: Secondary | ICD-10-CM

## 2018-07-07 DIAGNOSIS — E669 Obesity, unspecified: Secondary | ICD-10-CM | POA: Diagnosis not present

## 2018-07-07 DIAGNOSIS — N529 Male erectile dysfunction, unspecified: Secondary | ICD-10-CM | POA: Diagnosis not present

## 2018-07-07 MED ORDER — SILDENAFIL CITRATE 100 MG PO TABS: 100.0000 mg | ORAL_TABLET | Freq: Every day | ORAL | 0 refills | Status: DC | PRN

## 2018-07-07 NOTE — Progress Notes (Signed)
Subjective:   Chief Complaint  Patient presents with  . Follow-up    diabetes   Kyle Barber is a 58 y.o. male here for follow-up of diabetes.   [name]'s self monitored glucose range is low 100's Patient denies hypoglycemic reactions. He checks his glucose levels 1 times per day. Patient does not require insulin.   Medications include: Ozempic 0.5 mg/weekly  Exercise: physically active at work and home Diet: Starting to watch what he eats, lost 12 lbs  Having issues with erections. GF is going thru menopause and dryness also impeding his ability. Interested in starting a medication.   Past Medical History:  Diagnosis Date  . Asthma 02/27/2016  . Diabetes mellitus without complication ([facility])   . Pancreatitis   . Premature ventricular contraction      Related testing: Date of retinal exam: 07/07/17  Pneumovax: getting today Flu Shot: done  Review of Systems: Pulmonary:  No SOB Cardiovascular:  No chest pain  Objective:  BP 120/80 (BP Location: Left Arm, Patient Position: Sitting, Cuff Size: Normal)   Pulse 75   Temp 99 F (37.2 C) (Oral)   Ht 5\' 7"  (1.702 m)   Wt 201 lb (91.2 kg)   SpO2 98%   BMI 31.48 kg/m  General:  Well developed, well nourished, in no apparent distress Head:  Normocephalic, atraumatic Eyes:  Pupils equal and round, sclera anicteric without injection  Lungs:  CTAB, no access msc use Cardio:  RRR, no bruits, no LE edema Skin: Scattered erythematous papules and areas of telangiectasias, there is some scaling with base erythema behind his ears. Psych: Age appropriate judgment and insight  Assessment:   Diabetes mellitus type 2 in obese ([facility]) - Plan: Hemoglobin A1c, CANCELED: Hemoglobin A1c  Screen for colon cancer - Plan: Ambulatory referral to Gastroenterology  Erectile dysfunction, unspecified erectile dysfunction type - Plan: sildenafil (VIAGRA) 100 MG tablet  Skin lesions - Plan: Ambulatory referral to Dermatology  Need for vaccination  against Streptococcus pneumoniae - Plan: Pneumococcal polysaccharide vaccine 23-valent greater than or equal to 2yo subcutaneous/IM   Plan:   Sounds like his sugars have improved significantly on the Ozempic.  He has lost 12 pounds. Counseled on diet and exercise.  Check A1c today.  Update pneumococcal vaccine. Refer to gastroenterology for colon cancer screening. Trial sildenafil for ED. Refer to dermatology for skin checks.  It appears he has areas of actinic keratoses. F/u in 3 mo. The patient voiced understanding and agreement to the plan.  Kyle Roche Wever, DO 07/07/18 3:56 PM

## 2018-07-07 NOTE — Progress Notes (Signed)
Pre visit review using our clinic review tool, if applicable. No additional management support is needed unless otherwise documented below in the visit note. 

## 2018-07-07 NOTE — Patient Instructions (Addendum)
If you do not hear anything about your referrals in the next 1-2 weeks, call our office and ask for an update.  Give [place] 2-3 business days to get the results of your labs back.   Keep the diet clean and stay active.  Water-based lubricants can be helpful.   Let us know if you need anything.

## 2018-07-08 LAB — HEMOGLOBIN A1C
Hgb A1c MFr Bld: 7 % of total Hgb — ABNORMAL HIGH (ref <5.7)
Mean Plasma Glucose: 154 (calc)
eAG (mmol/L): 8.5 (calc)

## 2018-08-15 ENCOUNTER — Encounter: Payer: Self-pay | Admitting: Family Medicine

## 2018-08-16 ENCOUNTER — Encounter: Payer: Self-pay | Admitting: Family Medicine

## 2018-08-30 ENCOUNTER — Other Ambulatory Visit: Payer: Self-pay | Admitting: Family Medicine

## 2018-08-30 DIAGNOSIS — E669 Obesity, unspecified: Principal | ICD-10-CM

## 2018-08-30 DIAGNOSIS — E1169 Type 2 diabetes mellitus with other specified complication: Secondary | ICD-10-CM

## 2018-08-30 MED ORDER — SEMAGLUTIDE(0.25 OR 0.5MG/DOS) 2 MG/1.5ML ~~LOC~~ SOPN: 0.5000 mg | PEN_INJECTOR | SUBCUTANEOUS | 2 refills | Status: DC

## 2018-08-30 NOTE — Telephone Encounter (Signed)
Requested Prescriptions  Pending Prescriptions Disp Refills  . Semaglutide,0.25 or 0.5MG /DOS, (OZEMPIC, 0.25 OR 0.5 MG/DOSE,) 2 MG/1.5ML SOPN 1.5 mL 2    Sig: Inject 0.5 mg as directed once a week.     Endocrinology:  Diabetes - GLP-1 Receptor Agonists Passed - 08/30/2018 10:29 AM      Passed - HBA1C is between 0 and 7.9 and within 180 days    Hgb A1c MFr Bld  Date Value Ref Range Status  07/07/2018 7.0 (H) <5.7 % of total Hgb Final    Comment:    For someone without known diabetes, a hemoglobin A1c value of 6.5% or greater indicates that they may have  diabetes and this should be confirmed with a follow-up  test. . For someone with known diabetes, a value <7% indicates  that their diabetes is well controlled and a value  greater than or equal to 7% indicates suboptimal  control. A1c targets should be individualized based on  duration of diabetes, age, comorbid conditions, and  other considerations. . Currently, no consensus exists regarding use of hemoglobin A1c for diagnosis of diabetes for children. 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

## 2018-08-30 NOTE — Telephone Encounter (Signed)
Copied from CRM [id number]. Topic: Quick Communication - Rx Refill/Question >> Aug 30, 2018 10:21 AM Mickel Baas B, NT wrote: **Requesting 90 day supply. Patient also states that he has been out of this medication for 2 days already**  Medication: OZEMPIC, 0.25 OR 0.5 MG/DOSE, 2 MG/1.5ML SOPN   Has the patient contacted their pharmacy? Yes.  To call office (Agent: If no, request that the patient contact the pharmacy for the refill.) (Agent: If yes, when and what did the pharmacy advise?)  Preferred Pharmacy (with phone number or street name): [id number] - [place], [place] - [address]  Agent: Please be advised that RX refills may take up to 3 business days. We ask that you follow-up with your pharmacy.

## 2018-11-02 ENCOUNTER — Other Ambulatory Visit: Payer: Self-pay | Admitting: Family Medicine

## 2018-11-14 ENCOUNTER — Other Ambulatory Visit: Payer: Self-pay | Admitting: Family Medicine

## 2018-11-14 DIAGNOSIS — E1169 Type 2 diabetes mellitus with other specified complication: Secondary | ICD-10-CM

## 2018-11-29 ENCOUNTER — Telehealth: Payer: Self-pay | Admitting: Family Medicine

## 2018-11-29 DIAGNOSIS — E1169 Type 2 diabetes mellitus with other specified complication: Secondary | ICD-10-CM

## 2018-12-01 ENCOUNTER — Other Ambulatory Visit: Payer: Self-pay | Admitting: Family Medicine

## 2018-12-01 DIAGNOSIS — E1169 Type 2 diabetes mellitus with other specified complication: Secondary | ICD-10-CM

## 2018-12-01 DIAGNOSIS — E669 Obesity, unspecified: Secondary | ICD-10-CM

## 2018-12-01 NOTE — Telephone Encounter (Signed)
Patient would like a 90 day supply. Due to medication being less expensive for patient.   Ozempic (0.25 or 0.5 MG/DOSE) Patient call back # [phone]

## 2018-12-01 NOTE — Addendum Note (Signed)
Addended by: Scharlene Gloss B on: 12/01/2018 12:57 PM   Modules accepted: Orders

## 2018-12-02 ENCOUNTER — Other Ambulatory Visit: Payer: Self-pay | Admitting: Family Medicine

## 2018-12-05 ENCOUNTER — Other Ambulatory Visit: Payer: Self-pay | Admitting: Family Medicine

## 2018-12-05 DIAGNOSIS — E1169 Type 2 diabetes mellitus with other specified complication: Secondary | ICD-10-CM

## 2018-12-05 MED ORDER — SEMAGLUTIDE(0.25 OR 0.5MG/DOS) 2 MG/1.5ML ~~LOC~~ SOPN: 0.5000 mg | PEN_INJECTOR | SUBCUTANEOUS | 0 refills | Status: DC

## 2018-12-05 NOTE — Telephone Encounter (Signed)
Pt calling to check status. Pt states that he would like to have the 90 days so that it is cheaper. Please advise

## 2018-12-05 NOTE — Telephone Encounter (Signed)
Please advise 

## 2018-12-05 NOTE — Telephone Encounter (Signed)
Refill done///called to inform the patient and had to leave a detailed message that refill is done.

## 2018-12-05 NOTE — Telephone Encounter (Signed)
Sending in enough for 90 days/note to pharmacy how to use/fill

## 2018-12-29 ENCOUNTER — Other Ambulatory Visit: Payer: Self-pay | Admitting: Family Medicine

## 2018-12-29 DIAGNOSIS — E1169 Type 2 diabetes mellitus with other specified complication: Secondary | ICD-10-CM

## 2019-01-19 ENCOUNTER — Other Ambulatory Visit: Payer: Self-pay

## 2019-01-22 ENCOUNTER — Ambulatory Visit: Payer: 59 | Admitting: Family Medicine

## 2019-01-22 ENCOUNTER — Encounter: Payer: Self-pay | Admitting: Family Medicine

## 2019-01-22 ENCOUNTER — Other Ambulatory Visit: Payer: Self-pay

## 2019-01-22 VITALS — BP 128/84 | HR 80 | Temp 98.4°F | Ht 68.0 in | Wt 201.2 lb

## 2019-01-22 DIAGNOSIS — S46811A Strain of other muscles, fascia and tendons at shoulder and upper arm level, right arm, initial encounter: Secondary | ICD-10-CM

## 2019-01-22 DIAGNOSIS — N529 Male erectile dysfunction, unspecified: Secondary | ICD-10-CM | POA: Diagnosis not present

## 2019-01-22 MED ORDER — SILDENAFIL CITRATE 100 MG PO TABS: 100.0000 mg | ORAL_TABLET | Freq: Every day | ORAL | 5 refills | Status: DC | PRN

## 2019-01-22 NOTE — Addendum Note (Signed)
Addended by: Ames Coupe on: 01/22/2019 01:01 PM   Modules accepted: Orders

## 2019-01-22 NOTE — Patient Instructions (Signed)
Heat (pad or rice pillow in microwave) over affected area, 10-15 minutes twice daily.   Ice/cold pack over area for 10-15 min twice daily.  OK to take Tylenol 1000 mg (2 extra strength tabs) or 975 mg (3 regular strength tabs) every 6 hours as needed.  Trapezius stretches/exercises  Do exercises exactly as told by your health care provider and adjust them as directed. It is normal to feel mild stretching, pulling, tightness, or discomfort as you do these exercises, but you should stop right away if you feel sudden pain or your pain gets worse.  Stretching and range of motion exercises These exercises warm up your muscles and joints and improve the movement and flexibility of your shoulder. These exercises can also help to relieve pain, numbness, and tingling. If you are unable to do any of the following for any reason, do not further attempt to do it.   Exercise A: Flexion, standing    1. Stand and hold a broomstick, a cane, or a similar object. Place your hands a little more than shoulder-width apart on the object. Your left / right hand should be palm-up, and your other hand should be palm-down. 2. Push the stick to raise your left / right arm out to your side and then over your head. Use your other hand to help move the stick. Stop when you feel a stretch in your shoulder, or when you reach the angle that is recommended by your health care provider. ? Avoid shrugging your shoulder while you raise your arm. Keep your shoulder blade tucked down toward your spine. 3. Hold for 30 seconds. 4. Slowly return to the starting position. Repeat 2 times. Complete this exercise 3 times per week.  Exercise B: Abduction, supine    1. Lie on your back and hold a broomstick, a cane, or a similar object. Place your hands a little more than shoulder-width apart on the object. Your left / right hand should be palm-up, and your other hand should be palm-down. 2. Push the stick to raise your left / right  arm out to your side and then over your head. Use your other hand to help move the stick. Stop when you feel a stretch in your shoulder, or when you reach the angle that is recommended by your health care provider. ? Avoid shrugging your shoulder while you raise your arm. Keep your shoulder blade tucked down toward your spine. 3. Hold for 30 seconds. 4. Slowly return to the starting position. Repeat 2 times. Complete this exercise 3 times per week.  Exercise C: Flexion, active-assisted    1. Lie on your back. You may bend your knees for comfort. 2. Hold a broomstick, a cane, or a similar object. Place your hands about shoulder-width apart on the object. Your palms should face toward your feet. 3. Raise the stick and move your arms over your head and behind your head, toward the floor. Use your healthy arm to help your left / right arm move farther. Stop when you feel a gentle stretch in your shoulder, or when you reach the angle where your health care provider tells you to stop. 4. Hold for 30 seconds. 5. Slowly return to the starting position. Repeat 2 times. Complete this exercise 3 times per week.  Exercise D: External rotation and abduction    1. Stand in a door frame with one of your feet slightly in front of the other. This is called a staggered stance. 2. Choose one of   the following positions as told by your health care provider: ? Place your hands and forearms on the door frame above your head. ? Place your hands and forearms on the door frame at the height of your head. ? Place your hands on the door frame at the height of your elbows. 3. Slowly move your weight onto your front foot until you feel a stretch across your chest and in the front of your shoulders. Keep your head and chest upright and keep your abdominal muscles tight. 4. Hold for 30 seconds. 5. To release the stretch, shift your weight to your back foot. Repeat 2 times. Complete this stretch 3 times per week.   Strengthening exercises These exercises build strength and endurance in your shoulder. Endurance is the ability to use your muscles for a long time, even after your muscles get tired. Exercise E: Scapular depression and adduction  1. Sit on a stable chair. Support your arms in front of you with pillows, armrests, or a tabletop. Keep your elbows in line with the sides of your body. 2. Gently move your shoulder blades down toward your middle back. Relax the muscles on the tops of your shoulders and in the back of your neck. 3. Hold for 3 seconds. 4. Slowly release the tension and relax your muscles completely before doing this exercise again. Repeat for a total of 10 repetitions. 5. After you have practiced this exercise, try doing the exercise without the arm support. Then, try the exercise while standing instead of sitting. Repeat 2 times. Complete this exercise 3 times per week.  Exercise F: Shoulder abduction, isometric    1. Stand or sit about 4-6 inches (10-15 cm) from a wall with your left / right side facing the wall. 2. Bend your left / right elbow and gently press your elbow against the wall. 3. Increase the pressure slowly until you are pressing as hard as you can without shrugging your shoulder. 4. Hold for 3 seconds. 5. Slowly release the tension and relax your muscles completely. Repeat for a total of 10 repetitions. Repeat 2 times. Complete this exercise 3 times per week.  Exercise G: Shoulder flexion, isometric    1. Stand or sit about 4-6 inches (10-15 cm) away from a wall with your left / right side facing the wall. 2. Keep your left / right elbow straight and gently press the top of your fist against the wall. Increase the pressure slowly until you are pressing as hard as you can without shrugging your shoulder. 3. Hold for 10-15 seconds. 4. Slowly release the tension and relax your muscles completely. Repeat for a total of 10 repetitions. Repeat 2 times. Complete this  exercise 3 times per week.  Exercise H: Internal rotation    1. Sit in a stable chair without armrests, or stand. Secure an exercise band at your left / right side, at elbow height. 2. Place a soft object, such as a folded towel or a small pillow, under your left / right upper arm so your elbow is a few inches (about 8 cm) away from your side. 3. Hold the end of the exercise band so the band stretches. 4. Keeping your elbow pressed against the soft object under your arm, move your forearm across your body toward your abdomen. Keep your body steady so the movement is only coming from your shoulder. 5. Hold for 3 seconds. 6. Slowly return to the starting position. Repeat for a total of 10 repetitions. Repeat 2   times. Complete this exercise 3 times per week.  Exercise I: External rotation    1. Sit in a stable chair without armrests, or stand. 2. Secure an exercise band at your left / right side, at elbow height. 3. Place a soft object, such as a folded towel or a small pillow, under your left / right upper arm so your elbow is a few inches (about 8 cm) away from your side. 4. Hold the end of the exercise band so the band stretches. 5. Keeping your elbow pressed against the soft object under your arm, move your forearm out, away from your abdomen. Keep your body steady so the movement is only coming from your shoulder. 6. Hold for 3 seconds. 7. Slowly return to the starting position. Repeat for a total of 10 repetitions. Repeat 2 times. Complete this exercise 3 times per week. Exercise J: Shoulder extension  1. Sit in a stable chair without armrests, or stand. Secure an exercise band to a stable object in front of you so the band is at shoulder height. 2. Hold one end of the exercise band in each hand. Your palms should face each other. 3. Straighten your elbows and lift your hands up to shoulder height. 4. Step back, away from the secured end of the exercise band, until the band  stretches. 5. Squeeze your shoulder blades together and pull your hands down to the sides of your thighs. Stop when your hands are straight down by your sides. Do not let your hands go behind your body. 6. Hold for 3 seconds. 7. Slowly return to the starting position. Repeat for a total of 10 repetitions. Repeat 2 times. Complete this exercise 3 times per week.  Exercise K: Shoulder extension, prone    1. Lie on your abdomen on a firm surface so your left / right arm hangs over the edge. 2. Hold a 5 lb weight in your hand so your palm faces in toward your body. Your arm should be straight. 3. Squeeze your shoulder blade down toward the middle of your back. 4. Slowly raise your arm behind you, up to the height of the surface that you are lying on. Keep your arm straight. 5. Hold for 3 seconds. 6. Slowly return to the starting position and relax your muscles. Repeat for a total of 10 repetitions. Repeat 2 times. Complete this exercise 3 times per week.   Exercise L: Horizontal abduction, prone  1. Lie on your abdomen on a firm surface so your left / right arm hangs over the edge. 2. Hold a 5 lb weight in your hand so your palm faces toward your feet. Your arm should be straight. 3. Squeeze your shoulder blade down toward the middle of your back. 4. Bend your elbow so your hand moves up, until your elbow is bent to an "L" shape (90 degrees). With your elbow bent, slowly move your forearm forward and up. Raise your hand up to the height of the surface that you are lying on. ? Your upper arm should not move, and your elbow should stay bent. ? At the top of the movement, your palm should face the floor. 5. Hold for 3 seconds. 6. Slowly return to the starting position and relax your muscles. Repeat for a total of 10 repetitions. Repeat 2 times. Complete this exercise 3 times per week.  Exercise M: Horizontal abduction, standing  1. Sit on a stable chair, or stand. 2. Secure an exercise band  to a   stable object in front of you so the band is at shoulder height. 3. Hold one end of the exercise band in each hand. 4. Straighten your elbows and lift your hands straight in front of you, up to shoulder height. Your palms should face down, toward the floor. 5. Step back, away from the secured end of the exercise band, until the band stretches. 6. Move your arms out to your sides, and keep your arms straight. 7. Hold for 3 seconds. 8. Slowly return to the starting position. Repeat for a total of 10 repetitions. Repeat 2 times. Complete this exercise 3 times per week.  Exercise N: Scapular retraction and elevation  1. Sit on a stable chair, or stand. 2. Secure an exercise band to a stable object in front of you so the band is at shoulder height. 3. Hold one end of the exercise band in each hand. Your palms should face each other. 4. Sit in a stable chair without armrests, or stand. 5. Step back, away from the secured end of the exercise band, until the band stretches. 6. Squeeze your shoulder blades together and lift your hands over your head. Keep your elbows straight. 7. Hold for 3 seconds. 8. Slowly return to the starting position. Repeat for a total of 10 repetitions. Repeat 2 times. Complete this exercise 3 times per week.  This information is not intended to replace advice given to you by your health care provider. Make sure you discuss any questions you have with your health care provider. Document Released: 06/14/2005 Document Revised: 02/19/2016 Document Reviewed: 05/01/2015 Elsevier Interactive Patient Education  2017 [employer]   

## 2019-01-22 NOTE — Progress Notes (Signed)
Musculoskeletal Exam  Patient: Kyle Barber DOB: 1961-01-17  DOS: 01/22/2019  SUBJECTIVE:  Chief Complaint:   Chief Complaint  Patient presents with  . Neck Pain    Kyle Barber is a 58 y.o.  male for evaluation and treatment of neck pain.   Onset:  2 weeks ago. Helped a friend tear down a storage building.  Location: R neck/shoulder area Character:  aching, dull and sharp  Progression of issue:  has slightly improved Associated symptoms: decreased Treatment: to date has been ice, OTC NSAIDS and heat.   Neurovascular symptoms: no  ROS: Musculoskeletal/Extremities: +neck pain  Past Medical History:  Diagnosis Date  . Asthma 02/27/2016  . Diabetes mellitus without complication ([place])   . Pancreatitis   . Premature ventricular contraction     Objective: VITAL SIGNS: BP 128/84 (BP Location: Left Arm, Patient Position: Sitting, Cuff Size: Large)   Pulse 80   Temp 98.4 F (36.9 C) (Oral)   Ht 5\' 8"  (1.727 m)   Wt 201 lb 4 oz (91.3 kg)   SpO2 96%   BMI 30.60 kg/m  Constitutional: Well formed, well developed. No acute distress. Cardiovascular: Brisk cap refill Thorax & Lungs: No accessory muscle use Musculoskeletal: R shoulder/neck.   Normal active range of motion: yes.   Normal passive range of motion: yes Tenderness to palpation: yes Deformity: no Ecchymosis: no Tests positive: none Tests negative: Spurling's, Neer's, Hawkins, lift off, cross over, speed's Neurologic: Normal sensory function. No focal deficits noted. DTR's equal and symmetric in UE's. No clonus. Psychiatric: Normal mood. Age appropriate judgment and insight. Alert & oriented x 3.    Assessment:  Strain of right trapezius muscle, initial encounter - Plan: heat, ice, stretches/exercises, Tylenol.   Plan: Orders as above. F/u prn. The patient voiced understanding and agreement to the plan.   [place], DO 01/22/19  12:58 PM

## 2019-03-20 ENCOUNTER — Other Ambulatory Visit: Payer: Self-pay | Admitting: Family Medicine

## 2019-03-20 DIAGNOSIS — E1169 Type 2 diabetes mellitus with other specified complication: Secondary | ICD-10-CM

## 2019-03-20 MED ORDER — OZEMPIC (0.25 OR 0.5 MG/DOSE) 2 MG/1.5ML ~~LOC~~ SOPN: 0.5000 mg | PEN_INJECTOR | SUBCUTANEOUS | 1 refills | Status: DC

## 2019-03-20 NOTE — Telephone Encounter (Signed)
Requested medication (s) are due for refill today: yes  Requested medication (s) are on the active medication list: yes  Last refill:  12/29/2018  Future visit scheduled: no  Notes to clinic: review for refill   Requested Prescriptions  Pending Prescriptions Disp Refills   Semaglutide,0.25 or 0.5MG /DOS, (OZEMPIC, 0.25 OR 0.5 MG/DOSE,) 2 MG/1.5ML SOPN 3 mL 1     Endocrinology:  Diabetes - GLP-1 Receptor Agonists Failed - 03/20/2019 11:27 AM      Failed - HBA1C is between 0 and 7.9 and within 180 days    Hgb A1c MFr Bld  Date Value Ref Range Status  07/07/2018 7.0 (H) <5.7 % of total Hgb Final    Comment:    For someone without known diabetes, a hemoglobin A1c value of 6.5% or greater indicates that they may have  diabetes and this should be confirmed with a follow-up  test. . For someone with known diabetes, a value <7% indicates  that their diabetes is well controlled and a value  greater than or equal to 7% indicates suboptimal  control. A1c targets should be individualized based on  duration of diabetes, age, comorbid conditions, and  other considerations. . Currently, no consensus exists regarding use of hemoglobin A1c for diagnosis of diabetes for children. Renella Cunas - Valid encounter within last 6 months    Recent Outpatient Visits          1 month ago Strain of right trapezius muscle, initial encounter   [facility], [place], [place]   8 months ago Diabetes mellitus type 2 in obese [facility])   [facility], [place], [place]   11 months ago Well adult exam   [facility], [place], [place]   2 years ago Essential hypertension   [facility], [place], [place]   2 years ago Essential hypertension   [facility],  [place], [place]

## 2019-03-20 NOTE — Telephone Encounter (Signed)
Medication Refill - Medication: OZEMPIC, 0.25 OR 0.5 MG/DOSE, 2 MG/1.5ML SOPN    Preferred Pharmacy (with phone number or street name):  [id number] - [place], [place] [address] [phone] (Phone) [phone] (Fax)     Patient would like a 90-day supply

## 2019-04-10 ENCOUNTER — Other Ambulatory Visit: Payer: Self-pay | Admitting: Family Medicine

## 2019-04-10 MED ORDER — ALBUTEROL SULFATE HFA 108 (90 BASE) MCG/ACT IN AERS: 1.0000 | INHALATION_SPRAY | Freq: Four times a day (QID) | RESPIRATORY_TRACT | 1 refills | Status: DC | PRN

## 2019-04-10 NOTE — Telephone Encounter (Signed)
Medication Refill - Medication: albuterol  Has the patient contacted their pharmacy? Yes.   (Agent: If no, request that the patient contact the pharmacy for the refill.) (Agent: If yes, when and what did the pharmacy advise?)  Preferred Pharmacy (with phone number or street name):  [id number] - [place], [address]  [address] [place] [address]  Phone: [phone] Fax: [phone]  Not a 24 hour pharmacy; exact hours not known.     Agent: Please be advised that RX refills may take up to 3 business days. We ask that you follow-up with your pharmacy.

## 2019-05-14 ENCOUNTER — Inpatient Hospital Stay (HOSPITAL_COMMUNITY): Payer: 59

## 2019-05-14 ENCOUNTER — Other Ambulatory Visit: Payer: Self-pay

## 2019-05-14 ENCOUNTER — Emergency Department (HOSPITAL_COMMUNITY): Payer: 59

## 2019-05-14 ENCOUNTER — Inpatient Hospital Stay (HOSPITAL_COMMUNITY)
Admission: EM | Admit: 2019-05-14 | Discharge: 2019-05-15 | DRG: 247 | Disposition: A | Discharge status: Home / Self Care | Payer: 59 | Attending: Family Medicine | Admitting: Family Medicine

## 2019-05-14 ENCOUNTER — Encounter (HOSPITAL_COMMUNITY): Payer: Self-pay

## 2019-05-14 ENCOUNTER — Encounter (HOSPITAL_COMMUNITY)
Admission: EM | Disposition: A | Discharge status: Home / Self Care | Payer: Self-pay | Source: Home / Self Care | Attending: Internal Medicine

## 2019-05-14 DIAGNOSIS — I2510 Atherosclerotic heart disease of native coronary artery without angina pectoris: Secondary | ICD-10-CM

## 2019-05-14 DIAGNOSIS — E785 Hyperlipidemia, unspecified: Secondary | ICD-10-CM | POA: Diagnosis present

## 2019-05-14 DIAGNOSIS — R05 Cough: Secondary | ICD-10-CM | POA: Diagnosis present

## 2019-05-14 DIAGNOSIS — Z6830 Body mass index (BMI) 30.0-30.9, adult: Secondary | ICD-10-CM

## 2019-05-14 DIAGNOSIS — I25110 Atherosclerotic heart disease of native coronary artery with unstable angina pectoris: Secondary | ICD-10-CM | POA: Diagnosis present

## 2019-05-14 DIAGNOSIS — E875 Hyperkalemia: Secondary | ICD-10-CM | POA: Diagnosis present

## 2019-05-14 DIAGNOSIS — Z8249 Family history of ischemic heart disease and other diseases of the circulatory system: Secondary | ICD-10-CM

## 2019-05-14 DIAGNOSIS — K760 Fatty (change of) liver, not elsewhere classified: Secondary | ICD-10-CM | POA: Diagnosis present

## 2019-05-14 DIAGNOSIS — Z8719 Personal history of other diseases of the digestive system: Secondary | ICD-10-CM

## 2019-05-14 DIAGNOSIS — F17210 Nicotine dependence, cigarettes, uncomplicated: Secondary | ICD-10-CM | POA: Diagnosis present

## 2019-05-14 DIAGNOSIS — Z20828 Contact with and (suspected) exposure to other viral communicable diseases: Secondary | ICD-10-CM | POA: Diagnosis present

## 2019-05-14 DIAGNOSIS — R079 Chest pain, unspecified: Secondary | ICD-10-CM

## 2019-05-14 DIAGNOSIS — I10 Essential (primary) hypertension: Secondary | ICD-10-CM | POA: Diagnosis present

## 2019-05-14 DIAGNOSIS — J45909 Unspecified asthma, uncomplicated: Secondary | ICD-10-CM | POA: Diagnosis present

## 2019-05-14 DIAGNOSIS — R945 Abnormal results of liver function studies: Secondary | ICD-10-CM

## 2019-05-14 DIAGNOSIS — E1169 Type 2 diabetes mellitus with other specified complication: Secondary | ICD-10-CM | POA: Diagnosis not present

## 2019-05-14 DIAGNOSIS — Z833 Family history of diabetes mellitus: Secondary | ICD-10-CM

## 2019-05-14 DIAGNOSIS — Z79899 Other long term (current) drug therapy: Secondary | ICD-10-CM | POA: Diagnosis not present

## 2019-05-14 DIAGNOSIS — E669 Obesity, unspecified: Secondary | ICD-10-CM | POA: Diagnosis present

## 2019-05-14 DIAGNOSIS — F1020 Alcohol dependence, uncomplicated: Secondary | ICD-10-CM | POA: Diagnosis present

## 2019-05-14 DIAGNOSIS — R059 Cough, unspecified: Secondary | ICD-10-CM | POA: Diagnosis present

## 2019-05-14 DIAGNOSIS — Z825 Family history of asthma and other chronic lower respiratory diseases: Secondary | ICD-10-CM | POA: Diagnosis not present

## 2019-05-14 DIAGNOSIS — E876 Hypokalemia: Secondary | ICD-10-CM | POA: Diagnosis not present

## 2019-05-14 DIAGNOSIS — T503X5A Adverse effect of electrolytic, caloric and water-balance agents, initial encounter: Secondary | ICD-10-CM | POA: Diagnosis not present

## 2019-05-14 DIAGNOSIS — Z955 Presence of coronary angioplasty implant and graft: Secondary | ICD-10-CM

## 2019-05-14 DIAGNOSIS — E119 Type 2 diabetes mellitus without complications: Secondary | ICD-10-CM | POA: Diagnosis present

## 2019-05-14 DIAGNOSIS — Z885 Allergy status to narcotic agent status: Secondary | ICD-10-CM | POA: Diagnosis not present

## 2019-05-14 DIAGNOSIS — I214 Non-ST elevation (NSTEMI) myocardial infarction: Secondary | ICD-10-CM | POA: Diagnosis present

## 2019-05-14 DIAGNOSIS — T471X5A Adverse effect of other antacids and anti-gastric-secretion drugs, initial encounter: Secondary | ICD-10-CM | POA: Diagnosis not present

## 2019-05-14 DIAGNOSIS — E782 Mixed hyperlipidemia: Secondary | ICD-10-CM | POA: Diagnosis not present

## 2019-05-14 DIAGNOSIS — E781 Pure hyperglyceridemia: Secondary | ICD-10-CM | POA: Diagnosis present

## 2019-05-14 HISTORY — DX: Essential (primary) hypertension: I10

## 2019-05-14 HISTORY — PX: CORONARY STENT INTERVENTION: CATH118234

## 2019-05-14 HISTORY — PX: LEFT HEART CATH AND CORONARY ANGIOGRAPHY: CATH118249

## 2019-05-14 LAB — BASIC METABOLIC PANEL
Anion gap: 12 (ref 5–15)
Anion gap: 12 (ref 5–15)
BUN: 16 mg/dL (ref 6–20)
BUN: 12 mg/dL (ref 6–20)
CO2: 25 mmol/L (ref 22–32)
CO2: 22 mmol/L (ref 22–32)
Calcium: 9 mg/dL (ref 8.9–10.3)
Calcium: 9.4 mg/dL (ref 8.9–10.3)
Chloride: 100 mmol/L (ref 98–111)
Chloride: 102 mmol/L (ref 98–111)
Creatinine, Ser: 1.2 mg/dL (ref 0.61–1.24)
Creatinine, Ser: 0.61 mg/dL (ref 0.61–1.24)
GFR calc Af Amer: >60 mL/min (ref >60)
GFR calc Af Amer: >60 mL/min (ref >60)
GFR calc non Af Amer: >60 mL/min (ref >60)
GFR calc non Af Amer: >60 mL/min (ref >60)
Glucose, Bld: 277 mg/dL — ABNORMAL HIGH (ref 70–99)
Glucose, Bld: 214 mg/dL — ABNORMAL HIGH (ref 70–99)
Potassium: 5.6 mmol/L — ABNORMAL HIGH (ref 3.5–5.1)
Potassium: 4.6 mmol/L (ref 3.5–5.1)
Sodium: 137 mmol/L (ref 135–145)
Sodium: 136 mmol/L (ref 135–145)

## 2019-05-14 LAB — HEPATIC FUNCTION PANEL
ALT: 50 U/L — ABNORMAL HIGH (ref 0–44)
AST: 49 U/L — ABNORMAL HIGH (ref 15–41)
Albumin: 4.2 g/dL (ref 3.5–5.0)
Alkaline Phosphatase: 53 U/L (ref 38–126)
Bilirubin, Direct: 0.7 mg/dL — ABNORMAL HIGH (ref 0.0–0.2)
Indirect Bilirubin: 1.3 mg/dL — ABNORMAL HIGH (ref 0.3–0.9)
Total Bilirubin: 2 mg/dL — ABNORMAL HIGH (ref 0.3–1.2)
Total Protein: 7.2 g/dL (ref 6.5–8.1)

## 2019-05-14 LAB — SARS CORONAVIRUS 2 (TAT 6-24 HRS): SARS Coronavirus 2: NEGATIVE

## 2019-05-14 LAB — GLUCOSE, CAPILLARY
Glucose-Capillary: 211 mg/dL — ABNORMAL HIGH (ref 70–99)
Glucose-Capillary: 139 mg/dL — ABNORMAL HIGH (ref 70–99)

## 2019-05-14 LAB — CBC
HCT: 49.7 % (ref 39.0–52.0)
Hemoglobin: 17.5 g/dL — ABNORMAL HIGH (ref 13.0–17.0)
MCH: 33.9 pg (ref 26.0–34.0)
MCHC: 35.2 g/dL (ref 30.0–36.0)
MCV: 96.3 fL (ref 80.0–100.0)
Platelets: 195 10*3/uL (ref 150–400)
RBC: 5.16 MIL/uL (ref 4.22–5.81)
RDW: 11.4 % — ABNORMAL LOW (ref 11.5–15.5)
WBC: 9.1 10*3/uL (ref 4.0–10.5)
nRBC: 0 % (ref 0.0–0.2)

## 2019-05-14 LAB — HEMOGLOBIN A1C
Hgb A1c MFr Bld: 8 % — ABNORMAL HIGH (ref 4.8–5.6)
Mean Plasma Glucose: 182.9 mg/dL

## 2019-05-14 LAB — CBG MONITORING, ED
Glucose-Capillary: 255 mg/dL — ABNORMAL HIGH (ref 70–99)
Glucose-Capillary: 205 mg/dL — ABNORMAL HIGH (ref 70–99)

## 2019-05-14 LAB — ECHOCARDIOGRAM COMPLETE
Height: 68 in
Weight: 3216 oz

## 2019-05-14 LAB — PROTIME-INR
INR: 1 (ref 0.8–1.2)
Prothrombin Time: 12.7 seconds (ref 11.4–15.2)

## 2019-05-14 LAB — POCT ACTIVATED CLOTTING TIME: Activated Clotting Time: 368 seconds

## 2019-05-14 LAB — LIPASE, BLOOD: Lipase: 48 U/L (ref 11–51)

## 2019-05-14 LAB — TROPONIN I (HIGH SENSITIVITY)
Troponin I (High Sensitivity): 24 ng/L — ABNORMAL HIGH (ref <18)
Troponin I (High Sensitivity): 260 ng/L (ref <18)
Troponin I (High Sensitivity): 6112 ng/L (ref <18)

## 2019-05-14 LAB — CK TOTAL AND CKMB (NOT AT ARMC)
CK, MB: 89.3 ng/mL — ABNORMAL HIGH (ref 0.5–5.0)
Relative Index: 14.3 — ABNORMAL HIGH (ref 0.0–2.5)
Total CK: 624 U/L — ABNORMAL HIGH (ref 49–397)

## 2019-05-14 LAB — APTT: aPTT: 30 seconds (ref 24–36)

## 2019-05-14 LAB — CORTISOL: Cortisol, Plasma: 21.5 ug/dL

## 2019-05-14 SURGERY — LEFT HEART CATH AND CORONARY ANGIOGRAPHY
Anesthesia: LOCAL

## 2019-05-14 MED ORDER — LIDOCAINE HCL (PF) 1 % IJ SOLN
INTRAMUSCULAR | Status: DC | PRN
  Administered 2019-05-14: 2 mL

## 2019-05-14 MED ORDER — LORAZEPAM 2 MG/ML IJ SOLN: 1.0000 mg | INTRAMUSCULAR | Status: DC | PRN

## 2019-05-14 MED ORDER — LORAZEPAM 1 MG PO TABS
1.0000 mg | ORAL_TABLET | ORAL | Status: DC | PRN
  Administered 2019-05-14: 08:00:00 1 mg via ORAL
  Filled 2019-05-14: qty 1

## 2019-05-14 MED ORDER — SODIUM POLYSTYRENE SULFONATE 15 GM/60ML PO SUSP
15.0000 g | Freq: Once | ORAL | Status: AC
  Administered 2019-05-14: 08:00:00 15 g via ORAL
  Filled 2019-05-14: qty 60

## 2019-05-14 MED ORDER — HEPARIN (PORCINE) 25000 UT/250ML-% IV SOLN
900.0000 [IU]/h | INTRAVENOUS | Status: DC
  Administered 2019-05-14: 08:00:00 900 [IU]/h via INTRAVENOUS
  Filled 2019-05-14: qty 250

## 2019-05-14 MED ORDER — FENTANYL CITRATE (PF) 100 MCG/2ML IJ SOLN
INTRAMUSCULAR | Status: AC
  Filled 2019-05-14: qty 2

## 2019-05-14 MED ORDER — HEPARIN SODIUM (PORCINE) 1000 UNIT/ML IJ SOLN
INTRAMUSCULAR | Status: DC | PRN
  Administered 2019-05-14: 4000 [IU] via INTRAVENOUS
  Administered 2019-05-14: 5000 [IU] via INTRAVENOUS

## 2019-05-14 MED ORDER — ALBUTEROL SULFATE HFA 108 (90 BASE) MCG/ACT IN AERS: 1.0000 | INHALATION_SPRAY | Freq: Four times a day (QID) | RESPIRATORY_TRACT | Status: DC | PRN

## 2019-05-14 MED ORDER — PERFLUTREN LIPID MICROSPHERE
1.0000 mL | INTRAVENOUS | Status: AC | PRN
  Administered 2019-05-14: 09:00:00 2 mL via INTRAVENOUS
  Filled 2019-05-14: qty 10

## 2019-05-14 MED ORDER — SODIUM CHLORIDE 0.9 % WEIGHT BASED INFUSION
1.0000 mL/kg/h | INTRAVENOUS | Status: AC
  Administered 2019-05-14: 18:00:00 1 mL/kg/h via INTRAVENOUS

## 2019-05-14 MED ORDER — PANTOPRAZOLE SODIUM 40 MG PO TBEC
40.0000 mg | DELAYED_RELEASE_TABLET | Freq: Every day | ORAL | Status: DC
  Administered 2019-05-14 – 2019-05-15 (×2): 40 mg via ORAL
  Filled 2019-05-14 (×2): qty 1

## 2019-05-14 MED ORDER — FENTANYL CITRATE (PF) 100 MCG/2ML IJ SOLN
INTRAMUSCULAR | Status: DC | PRN
  Administered 2019-05-14: 25 ug via INTRAVENOUS

## 2019-05-14 MED ORDER — SODIUM CHLORIDE 0.9% FLUSH: 3.0000 mL | Freq: Two times a day (BID) | INTRAVENOUS | Status: DC

## 2019-05-14 MED ORDER — VERAPAMIL HCL 2.5 MG/ML IV SOLN
INTRAVENOUS | Status: DC | PRN
  Administered 2019-05-14 (×2): 200 ug via INTRACORONARY
  Administered 2019-05-14: 100 ug via INTRACORONARY
  Administered 2019-05-14: 200 ug via INTRACORONARY

## 2019-05-14 MED ORDER — FOLIC ACID 1 MG PO TABS
1.0000 mg | ORAL_TABLET | Freq: Every day | ORAL | Status: DC
  Administered 2019-05-14 – 2019-05-15 (×2): 1 mg via ORAL
  Filled 2019-05-14 (×2): qty 1

## 2019-05-14 MED ORDER — ACETAMINOPHEN 325 MG PO TABS
650.0000 mg | ORAL_TABLET | Freq: Four times a day (QID) | ORAL | Status: DC | PRN
  Administered 2019-05-14 (×2): 650 mg via ORAL
  Filled 2019-05-14 (×2): qty 2

## 2019-05-14 MED ORDER — SODIUM CHLORIDE 0.9 % WEIGHT BASED INFUSION: 3.0000 mL/kg/h | INTRAVENOUS | Status: DC

## 2019-05-14 MED ORDER — ATORVASTATIN CALCIUM 80 MG PO TABS
80.0000 mg | ORAL_TABLET | Freq: Every day | ORAL | Status: DC
  Filled 2019-05-14: qty 1

## 2019-05-14 MED ORDER — LIDOCAINE VISCOUS HCL 2 % MT SOLN
15.0000 mL | Freq: Once | OROMUCOSAL | Status: AC
  Administered 2019-05-14: 04:00:00 15 mL via ORAL
  Filled 2019-05-14: qty 15

## 2019-05-14 MED ORDER — CARVEDILOL 3.125 MG PO TABS
3.1250 mg | ORAL_TABLET | Freq: Two times a day (BID) | ORAL | Status: DC
  Administered 2019-05-14: 08:00:00 3.125 mg via ORAL
  Filled 2019-05-14 (×2): qty 1

## 2019-05-14 MED ORDER — INSULIN ASPART 100 UNIT/ML ~~LOC~~ SOLN
0.0000 [IU] | SUBCUTANEOUS | Status: DC
  Administered 2019-05-14 – 2019-05-15 (×2): 1 [IU] via SUBCUTANEOUS
  Administered 2019-05-15 (×3): 2 [IU] via SUBCUTANEOUS
  Filled 2019-05-14: qty 0.09

## 2019-05-14 MED ORDER — THIAMINE HCL 100 MG/ML IJ SOLN: 100.0000 mg | Freq: Every day | INTRAMUSCULAR | Status: DC

## 2019-05-14 MED ORDER — ADULT MULTIVITAMIN W/MINERALS CH
1.0000 | ORAL_TABLET | Freq: Every day | ORAL | Status: DC
  Administered 2019-05-14 – 2019-05-15 (×2): 1 via ORAL
  Filled 2019-05-14 (×2): qty 1

## 2019-05-14 MED ORDER — NITROGLYCERIN IN D5W 200-5 MCG/ML-% IV SOLN
0.0000 ug/min | INTRAVENOUS | Status: DC
  Administered 2019-05-14: 07:00:00 3 ug/min via INTRAVENOUS
  Filled 2019-05-14: qty 250

## 2019-05-14 MED ORDER — LORAZEPAM 2 MG/ML IJ SOLN: 0.0000 mg | Freq: Three times a day (TID) | INTRAMUSCULAR | Status: DC

## 2019-05-14 MED ORDER — TICAGRELOR 90 MG PO TABS
ORAL_TABLET | ORAL | Status: AC
  Filled 2019-05-14: qty 2

## 2019-05-14 MED ORDER — VERAPAMIL HCL 2.5 MG/ML IV SOLN
INTRAVENOUS | Status: DC | PRN
  Administered 2019-05-14: 10 mL via INTRA_ARTERIAL

## 2019-05-14 MED ORDER — SODIUM CHLORIDE 0.9% FLUSH: 3.0000 mL | INTRAVENOUS | Status: DC | PRN

## 2019-05-14 MED ORDER — ASPIRIN 81 MG PO CHEW
81.0000 mg | CHEWABLE_TABLET | Freq: Every day | ORAL | Status: DC
  Administered 2019-05-15: 11:00:00 81 mg via ORAL
  Filled 2019-05-14: qty 1

## 2019-05-14 MED ORDER — VERAPAMIL HCL 2.5 MG/ML IV SOLN
INTRAVENOUS | Status: AC
  Filled 2019-05-14: qty 2

## 2019-05-14 MED ORDER — TICAGRELOR 90 MG PO TABS
90.0000 mg | ORAL_TABLET | Freq: Two times a day (BID) | ORAL | Status: DC
  Administered 2019-05-14 – 2019-05-15 (×2): 90 mg via ORAL
  Filled 2019-05-14 (×2): qty 1

## 2019-05-14 MED ORDER — NITROGLYCERIN 1 MG/10 ML FOR IR/CATH LAB
INTRA_ARTERIAL | Status: AC
  Filled 2019-05-14: qty 10

## 2019-05-14 MED ORDER — HYDRALAZINE HCL 20 MG/ML IJ SOLN
10.0000 mg | Freq: Four times a day (QID) | INTRAMUSCULAR | Status: DC | PRN
  Administered 2019-05-14: 10:00:00 10 mg via INTRAVENOUS
  Filled 2019-05-14: qty 1

## 2019-05-14 MED ORDER — ASPIRIN 325 MG PO TABS
325.0000 mg | ORAL_TABLET | Freq: Every day | ORAL | Status: DC
  Administered 2019-05-14: 325 mg via ORAL
  Filled 2019-05-14: qty 1

## 2019-05-14 MED ORDER — ONDANSETRON HCL 4 MG/2ML IJ SOLN: 4.0000 mg | Freq: Four times a day (QID) | INTRAMUSCULAR | Status: DC | PRN

## 2019-05-14 MED ORDER — AMLODIPINE BESYLATE 5 MG PO TABS
5.0000 mg | ORAL_TABLET | Freq: Every day | ORAL | Status: DC
  Administered 2019-05-14 – 2019-05-15 (×2): 5 mg via ORAL
  Filled 2019-05-14 (×2): qty 1

## 2019-05-14 MED ORDER — ASPIRIN 81 MG PO CHEW: 81.0000 mg | CHEWABLE_TABLET | ORAL | Status: DC

## 2019-05-14 MED ORDER — NITROGLYCERIN 1 MG/10 ML FOR IR/CATH LAB
INTRA_ARTERIAL | Status: DC | PRN
  Administered 2019-05-14: 100 ug

## 2019-05-14 MED ORDER — NITROGLYCERIN 0.4 MG SL SUBL
0.4000 mg | SUBLINGUAL_TABLET | SUBLINGUAL | Status: DC | PRN
  Administered 2019-05-14 (×2): 0.4 mg via SUBLINGUAL
  Filled 2019-05-14 (×2): qty 1

## 2019-05-14 MED ORDER — HEPARIN (PORCINE) IN NACL 1000-0.9 UT/500ML-% IV SOLN
INTRAVENOUS | Status: AC
  Filled 2019-05-14: qty 1000

## 2019-05-14 MED ORDER — SODIUM CHLORIDE 0.9% FLUSH
3.0000 mL | Freq: Once | INTRAVENOUS | Status: AC
  Administered 2019-05-14: 3 mL via INTRAVENOUS

## 2019-05-14 MED ORDER — HEPARIN BOLUS VIA INFUSION
4000.0000 [IU] | Freq: Once | INTRAVENOUS | Status: AC
  Administered 2019-05-14: 08:00:00 4000 [IU] via INTRAVENOUS
  Filled 2019-05-14: qty 4000

## 2019-05-14 MED ORDER — SODIUM BICARBONATE 8.4 % IV SOLN
50.0000 meq | Freq: Once | INTRAVENOUS | Status: AC
  Administered 2019-05-14: 08:00:00 50 meq via INTRAVENOUS
  Filled 2019-05-14: qty 50

## 2019-05-14 MED ORDER — LIDOCAINE HCL (PF) 1 % IJ SOLN
INTRAMUSCULAR | Status: AC
  Filled 2019-05-14: qty 30

## 2019-05-14 MED ORDER — ALUM & MAG HYDROXIDE-SIMETH 200-200-20 MG/5ML PO SUSP
30.0000 mL | Freq: Once | ORAL | Status: AC
  Administered 2019-05-14: 04:00:00 30 mL via ORAL
  Filled 2019-05-14: qty 30

## 2019-05-14 MED ORDER — MIDAZOLAM HCL 2 MG/2ML IJ SOLN
INTRAMUSCULAR | Status: AC
  Filled 2019-05-14: qty 2

## 2019-05-14 MED ORDER — HEPARIN (PORCINE) IN NACL 1000-0.9 UT/500ML-% IV SOLN
INTRAVENOUS | Status: DC | PRN
  Administered 2019-05-14 (×2): 500 mL

## 2019-05-14 MED ORDER — SODIUM CHLORIDE 0.9 % IV SOLN
INTRAVENOUS | Status: DC
  Administered 2019-05-14 (×2): via INTRAVENOUS

## 2019-05-14 MED ORDER — LORAZEPAM 2 MG/ML IJ SOLN: 0.0000 mg | INTRAMUSCULAR | Status: DC

## 2019-05-14 MED ORDER — CALCIUM GLUCONATE-NACL 1-0.675 GM/50ML-% IV SOLN
1.0000 g | Freq: Once | INTRAVENOUS | Status: AC
  Administered 2019-05-14: 08:00:00 1000 mg via INTRAVENOUS
  Filled 2019-05-14: qty 50

## 2019-05-14 MED ORDER — SODIUM CHLORIDE 0.9 % IV SOLN: 250.0000 mL | INTRAVENOUS | Status: DC | PRN

## 2019-05-14 MED ORDER — LABETALOL HCL 5 MG/ML IV SOLN: 10.0000 mg | INTRAVENOUS | Status: AC | PRN

## 2019-05-14 MED ORDER — SODIUM CHLORIDE 0.9 % WEIGHT BASED INFUSION: 1.0000 mL/kg/h | INTRAVENOUS | Status: DC

## 2019-05-14 MED ORDER — MIDAZOLAM HCL 2 MG/2ML IJ SOLN
INTRAMUSCULAR | Status: DC | PRN
  Administered 2019-05-14: 2 mg via INTRAVENOUS

## 2019-05-14 MED ORDER — TICAGRELOR 90 MG PO TABS
ORAL_TABLET | ORAL | Status: DC | PRN
  Administered 2019-05-14: 180 mg via ORAL

## 2019-05-14 MED ORDER — ACETAMINOPHEN 650 MG RE SUPP: 650.0000 mg | Freq: Four times a day (QID) | RECTAL | Status: DC | PRN

## 2019-05-14 MED ORDER — VITAMIN B-1 100 MG PO TABS
100.0000 mg | ORAL_TABLET | Freq: Every day | ORAL | Status: DC
  Administered 2019-05-14 – 2019-05-15 (×2): 100 mg via ORAL
  Filled 2019-05-14 (×2): qty 1

## 2019-05-14 SURGICAL SUPPLY — 17 items
BALLN EMERGE MR 2.5X15 (BALLOONS) ×2
BALLN SAPPHIRE ~~LOC~~ 3.25X12 (BALLOONS) ×2 IMPLANT
BALLOON EMERGE MR 2.5X15 (BALLOONS) ×1 IMPLANT
CATH 5FR JL3.5 JR4 ANG PIG MP (CATHETERS) ×2 IMPLANT
CATH INFINITI 5FR JL4 (CATHETERS) ×2 IMPLANT
CATH LAUNCHER 6FR EBU 4 (CATHETERS) ×2 IMPLANT
DEVICE RAD COMP TR BAND LRG (VASCULAR PRODUCTS) ×2 IMPLANT
GLIDESHEATH SLEND SS 6F .021 (SHEATH) ×2 IMPLANT
GUIDEWIRE INQWIRE 1.5J.035X260 (WIRE) ×1 IMPLANT
INQWIRE 1.5J .035X260CM (WIRE) ×2
KIT ENCORE 26 ADVANTAGE (KITS) ×2 IMPLANT
KIT HEART LEFT (KITS) ×2 IMPLANT
PACK CARDIAC CATHETERIZATION (CUSTOM PROCEDURE TRAY) ×2 IMPLANT
STENT RESOLUTE ONYX 3.0X18 (Permanent Stent) ×2 IMPLANT
TRANSDUCER W/STOPCOCK (MISCELLANEOUS) ×2 IMPLANT
TUBING CIL FLEX 10 FLL-RA (TUBING) ×2 IMPLANT
WIRE COUGAR XT STRL 190CM (WIRE) ×2 IMPLANT

## 2019-05-14 NOTE — Progress Notes (Signed)
  Echocardiogram 2D Echocardiogram has been performed.  Burnett Kanaris 05/14/2019, 9:02 AM

## 2019-05-14 NOTE — ED Triage Notes (Signed)
Patient arrived stating a few hours ago he began having central chest pain that radiates down both arm. Denies NV, SOB, or lightheadedness.

## 2019-05-14 NOTE — H&P (View-Only) (Signed)
Cardiology Consultation:   Patient ID: Kyle Barber MRN: [id number]; DOB: 19-Aug-1960  Admit date: 05/14/2019 Date of Consult: 05/14/2019  Primary Care Provider: Sharlene Dory, DO Primary Cardiologist: No primary care provider on file. New to [facility]- Dr. Elease Hashimoto Primary Electrophysiologist:  None    Patient Profile:   Kyle Barber is a 58 y.o. male with a hx of DM type 2, smoker, hypertension, asthma and daily alcohol use who is being seen today for the evaluation of chest pain and Non-STEMI at the request of Dr. Nelson Chimes.  History of Present Illness:   Kyle Barber has a past medical history as above. He presented to the hospital early this morning for the evaluation of epigastric/lower chest pain. He denies any prior cardiac issues. He had a stress test about 20-25 years ago in evaluation for panic attacks.  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 He usually has no chest discomfort. He has mild shortness of breath with walking a distances or up stairs that he attributes to asthma. He usually drinks 3-4 beers daily and smokes only socially a few times a month. He reports that at most in his past he has smoke 5-6 cigarettes per day.   Last night he had a glass of wine and then fell asleep on the couch at about 7 pm. He woke at about 10:30 and noted mild epigastric discomfort "not bad" and laid back down. At about midnight his epigastric pain became worse and he noted bilateral shoulder aching that went down both arms. He had no substernal chest pain/pressure. He denies having had any significant shortness of breath, nausea/vomiting, lightheadedness. He has some mild congestion and possible very mild dyspnea that he thinks is related to allergies. He notes that he has not had a BM in a few days and wondered if this could be pancreatitis. He reports that he had a bad episodes of pancreatitis about 7 years ago that required hospitalization.   His pain was rated as  7-8/10 at onset and has slowly improved overnight but is still constant, now 2-3/10 in the shoulder/arms. No current epigastric pain.   He notes that he had a late meal from [employer] on Saturday which he does not do often and he knows that this does not agree with him and his diabetes.   Chest xray showed no active cardiopulmonary disease. Troponins have risen from 24 to 260 LFTs are mildly elevated Renal function is normal with SCr 1.20. K+ is elevated at 5.6.  Hgb is high at 17.5 WBCs normal. Covid test in process.    Heart Pathway Score:     Past Medical History:  Diagnosis Date  . Asthma 02/27/2016  . Diabetes mellitus without complication (HCC)   . Hypertension   . Pancreatitis   . Premature ventricular contraction     Past Surgical History:  Procedure Laterality Date  . APPENDECTOMY    . HERNIA REPAIR       Home Medications:  Prior to Admission medications   Medication Sig Start Date End Date Taking? Authorizing Provider  albuterol (VENTOLIN HFA) 108 (90 Base) MCG/ACT inhaler Inhale 1-2 puffs into the lungs every 6 (six) hours as needed for wheezing or shortness of breath. 04/10/19  Yes Sharlene Dory, DO  amLODipine (NORVASC) 5 MG tablet TAKE 1 TABLET BY MOUTH EVERY DAY Patient taking differently: Take 5 mg by mouth daily.  12/04/18  Yes Sharlene Dory, DO  glucose blood test  strip Test blood sugars 2 times daily. 04/05/18  Yes Sharlene DoryWendling, Nicholas Paul, DO  Semaglutide,0.25 or 0.5MG /DOS, (OZEMPIC, 0.25 OR 0.5 MG/DOSE,) 2 MG/1.5ML SOPN Inject 0.5 mg as directed once a week. 03/20/19  Yes Sharlene DoryWendling, Nicholas Paul, DO  sildenafil (VIAGRA) 100 MG tablet Take 1 tablet (100 mg total) by mouth daily as needed for erectile dysfunction. 01/22/19  Yes Sharlene DoryWendling, Nicholas Paul, DO    Inpatient Medications: Scheduled Meds: . aspirin  325 mg Oral Daily  . atorvastatin  80 mg Oral q1800  . carvedilol  3.125 mg Oral BID WC  . folic acid  1 mg Oral Daily  . insulin aspart   0-9 Units Subcutaneous Q4H  . LORazepam  0-4 mg Intravenous Q4H   Followed by  . [START ON 05/16/2019] LORazepam  0-4 mg Intravenous Q8H  . multivitamin with minerals  1 tablet Oral Daily  . pantoprazole  40 mg Oral Daily  . thiamine  100 mg Oral Daily   Or  . thiamine  100 mg Intravenous Daily   Continuous Infusions: . sodium chloride 50 mL/hr at 05/14/19 0646  . heparin 900 Units/hr (05/14/19 0805)  . nitroGLYCERIN 10 mcg/min (05/14/19 0803)   PRN Meds: acetaminophen **OR** acetaminophen, hydrALAZINE, LORazepam **OR** LORazepam, nitroGLYCERIN  Allergies:    Allergies  Allergen Reactions  . Codeine Nausea Only    Social History:   Social History   Socioeconomic History  . Marital status: Divorced    Spouse name: Not on file  . Number of children: Not on file  . Years of education: Not on file  . Highest education level: Not on file  Occupational History  . Not on file  Social Needs  . Financial resource strain: Not on file  . Food insecurity    Worry: Not on file    Inability: Not on file  . Transportation needs    Medical: Not on file    Non-medical: Not on file  Tobacco Use  . Smoking status: Former Smoker    Packs/day: 0.25    Years: 10.00    Pack years: 2.50    Types: Cigarettes    Quit date: 07/01/2011    Years since quitting: 7.8  . Smokeless tobacco: Former NeurosurgeonUser    Quit date: 06/29/2011  Substance and Sexual Activity  . Alcohol use: Yes    Comment: weekly  . Drug use: No  . Sexual activity: Not on file  Lifestyle  . Physical activity    Days per week: Not on file    Minutes per session: Not on file  . Stress: Not on file  Relationships  . Social Musicianconnections    Talks on phone: Not on file    Gets together: Not on file    Attends religious service: Not on file    Active member of club or organization: Not on file    Attends meetings of clubs or organizations: Not on file    Relationship status: Not on file  . Intimate partner violence    Fear  of current or ex partner: Not on file    Emotionally abused: Not on file    Physically abused: Not on file    Forced sexual activity: Not on file  Other Topics Concern  . Not on file  Social History Narrative  . Not on file    Family History:    Family History  Problem Relation Age of Onset  . Emphysema Mother   . Diabetes Father   .  CAD Father        Died of MI at age 57  . Diabetes Paternal Grandmother   . CAD Paternal Grandfather        died at age 51     ROS:  Please see the history of present illness.   All other ROS reviewed and negative.     Physical Exam/Data:   Vitals:   05/14/19 0702 05/14/19 0710 05/14/19 0805 05/14/19 0815  BP: 139/65 (!) 161/79 (!) 158/79 (!) 176/93  Pulse:  79 80 78  Resp:  17 19 (!) 22  Temp:      TempSrc:      SpO2:  93% 92% 92%  Weight:      Height:       No intake or output data in the 24 hours ending 05/14/19 0846 Last 3 Weights 05/14/2019 01/22/2019 07/07/2018  Weight (lbs) 201 lb 201 lb 4 oz 201 lb  Weight (kg) 91.173 kg 91.286 kg 91.173 kg     Body mass index is 30.56 kg/m.  General:  Well nourished, well developed, in no acute distress HEENT: normal Lymph: no adenopathy Neck: no JVD Endocrine:  No thryomegaly Vascular: No carotid bruits; FA pulses 2+ bilaterally without bruits  Cardiac:  normal S1, S2; RRR; no murmur  Lungs:  clear to auscultation bilaterally, no wheezing, rhonchi or rales  Abd: soft, nontender, no hepatomegaly  Ext: no edema Musculoskeletal:  No deformities, BUE and BLE strength normal and equal Skin: warm and dry  Neuro:  CNs 2-12 intact, no focal abnormalities noted Psych:  Normal affect   EKG:  The EKG was personally reviewed and demonstrates:  Sinus rhythm, 74 bpm, new slight scooping ST depression in inferiolateral leads No STEMI. Telemetry:  Telemetry was personally reviewed and demonstrates:  Sinus rhythm in the 70's, no ectopy  Relevant CV Studies:  None  Laboratory Data:  High  Sensitivity Troponin:   Recent Labs  Lab 05/14/19 0332 05/14/19 0456  TROPONINIHS 24* 260*     Chemistry Recent Labs  Lab 05/14/19 0332  NA 137  K 5.6*  CL 100  CO2 25  GLUCOSE 277*  BUN 16  CREATININE 1.20  CALCIUM 9.0  GFRNONAA >60  GFRAA >60  ANIONGAP 12    Recent Labs  Lab 05/14/19 0402  PROT 7.2  ALBUMIN 4.2  AST 49*  ALT 50*  ALKPHOS 53  BILITOT 2.0*   Hematology Recent Labs  Lab 05/14/19 0332  WBC 9.1  RBC 5.16  HGB 17.5*  HCT 49.7  MCV 96.3  MCH 33.9  MCHC 35.2  RDW 11.4*  PLT 195   BNPNo results for input(s): BNP, PROBNP in the last 168 hours.  DDimer No results for input(s): DDIMER in the last 168 hours.   Radiology/Studies:  Dg Chest 2 View  Result Date: 05/14/2019 CLINICAL DATA:  Chest pain EXAM: CHEST - 2 VIEW COMPARISON:  11/27/2014 FINDINGS: Heart and mediastinal contours are within normal limits. No focal opacities or effusions. No acute bony abnormality. Multiple old right rib fractures again noted, unchanged. IMPRESSION: No active cardiopulmonary disease. Electronically Signed   By: Charlett Nose M.D.   On: 05/14/2019 03:26   US Abdomen Limited Ruq  Result Date: 05/14/2019 CLINICAL DATA:  Abnormal liver function test. EXAM: ULTRASOUND ABDOMEN LIMITED RIGHT UPPER QUADRANT COMPARISON:  August 02, 2011. FINDINGS: Gallbladder: No gallstones or wall thickening visualized. No sonographic Murphy sign noted by sonographer. Common bile duct: Diameter: 2 mm which is within normal limits. Liver:  No focal lesion identified. Increased echogenicity of hepatic parenchyma is noted suggesting hepatic steatosis. Portal vein is patent on color Doppler imaging with normal direction of blood flow towards the liver. Other: None. IMPRESSION: Probable hepatic steatosis. No other abnormality seen in the right upper quadrant of the abdomen. Electronically Signed   By: Marijo Conception M.D.   On: 05/14/2019 07:11    Assessment and Plan:   Non-STEMI -Pt with  onset of epigastric pain followed by aching of both shoulders going down the arms. No substernal chest pain or shortness of breath. Constant, has improved from 7-8/10 to 2-3/10.  -CVD risk factors include DM, smoker, hypertension, obesity -EKG with slight scooping ST depressions inferolaterally. I will obtain another EKG to assess for dynamic changes.  -Chest xray showed no active cardiopulmonary disease. -High sensitivity Troponins have risen from 24 to 260. Follow up level is scheduled for 11:00.  -Renal function is normal.  -Pt has been given aspirin. High intensity statin ordered as well as IV heparin and IN NTG.  -Echocardiogram has been ordered.  -His discomfort is concerning for radiation down the arms, but is constant without significant EKG changes. With mild EKG changes and elevated troponin levels, will Plan for cardiac catheterization to definitively evaluate coronary arteries. Dr. Acie Fredrickson will see pt wither here at [facility] or when he gets to [facility] for cath.  -The patient understands that risks include but are not limited to stroke (1 in 1000), death (1 in 59), kidney failure [usually temporary] (1 in 500), bleeding (1 in 200), allergic reaction [possibly serious] (1 in 200).   -Covid test is pending and expected to be complete at 1:06 pm.  -Pt is hemodynamically stable.   Epigastric pain -Mildly elevated LFTs. Lipase is normal. Primary team checking hepatitis panel.  -Abd US showed probable hepatic steatosis.   Diabetes type 2 -A1c 7.0. On Semaglutide per PCP, being stopped due to epigastric pain discomfort and hx of pancreatitis.   Hypertension -Blood pressure is elevated. Home meds include amlodipine 5 mg daily. Pt has been switched to carvedilol 3.125 mg BID per primary team. Hydralazine ordered for PRN use.   Hyperlipidemia -In setting of NSTEMI -high intensity statin has been started with atorvastatin 80 mg daily -Last lipid panel in 03/2018 with hypertriglyceridemia, trig 555.  Direct LDL 60, LDL 33.9, TC 166. Updated lipid panel has been ordered.  -Will need to check lipid panel and LFTs in 6 weeks following initiation of statin.    Hyperkalemia -K+ 5.6 on presentation. Pt is ordered calcium gluconate, sodium bicarb and kayexalate. Would recheck potassium level.  -Primary team is also checking cortisol.   Tobacco abuse -Pt has been a light smoker with 5-6 cigarettes per day in the past and now he reports only occasionally in social situations.  -Advise on complete cessation.   Alcohol use -Pt reports consumption of 3-4 beers per day and occasionally wine.  -Advised to reduce or ideally eliminate alcohol especially in light of his diabetes.       For questions or updates, please contact [place] Please consult [email or web address] for contact info under     Signed, Daune Perch, NP  05/14/2019 8:46 AM  Attending Note:   The patient was seen and examined.  Agree with assessment and plan as noted above.  Changes made to the above note as needed.  Patient seen and independently examined with Pecolia Ades, NP .   We discussed all aspects of the encounter. I agree with the assessment  and plan as stated above.  1.   Unstable angina: Patient presents with symptoms that could be an angina equivalent.  He is having abdominal pain with radiation to the shoulder and down to his left arm.  The pain started at midnight last night.  His EKG upon arrival to the [facility] long emergency room revealed some scooping ST depression.  With nitroglycerin this ST depression seems to have resolved slightly.  He still having some pain. Troponin levels have gone from 24 earlier this morning to 260 several hours later.  I think our best course of action is to proceed with heart catheterization.  Covid test is in progress.  We discussed the risks, benefits, options regarding heart catheterization.  He understands and agrees agrees to proceed.  We have discussed smoking cessation.   [facility] to [facility] once covid test is found to be negative    I have spent a total of 40 minutes with patient reviewing hospital  notes , telemetry, EKGs, labs and examining patient as well as establishing an assessment and plan that was discussed with the patient. > 50% of time was spent in direct patient care.    Vesta Mixer, Montez Hageman., MD, [facility] 05/14/2019, 11:25 AM [address],  [address] Office [phone] Pager [phone]

## 2019-05-14 NOTE — Progress Notes (Signed)
ANTICOAGULATION CONSULT NOTE - Initial Consult  Pharmacy Consult for IV heparin Indication: chest pain/ACS  Allergies  Allergen Reactions  . Codeine Nausea Only    Patient Measurements: Height: 5\' 8"  (172.7 cm) Weight: 201 lb (91.2 kg) IBW/kg (Calculated) : 68.4 Heparin Dosing Weight: 75 kg  Vital Signs: Temp: 97.8 F (36.6 C) (11/16 0252) Temp Source: Oral (11/16 0252) BP: 187/76 (11/16 0530) Pulse Rate: 75 (11/16 0530)  Labs: Recent Labs    05/14/19 0332 05/14/19 0456  HGB 17.5*  --   HCT 49.7  --   PLT 195  --   CREATININE 1.20  --   TROPONINIHS 24* 260*    Estimated Creatinine Clearance: 71.8 mL/min (by C-G formula based on SCr of 1.2 mg/dL).   Medical History: Past Medical History:  Diagnosis Date  . Asthma 02/27/2016  . Diabetes mellitus without complication ([place])   . Pancreatitis   . Premature ventricular contraction     Medications:  Scheduled:   Infusions:  . sodium chloride    . nitroGLYCERIN      Assessment: 86 yoM with CP and elevated troponin.  Baseline labs: H/H 17.5/49.7, plts = 195, aptt and INR pending  Goal of Therapy:  Heparin level 0.3-0.7 units/ml Monitor platelets by anticoagulation protocol: Yes   Plan:  Baseline aptt and INR STAT Heparin 4000 unit IV bolus x1 Start drip at 900 units/hr Daily CBC/HL Check 1st HL in 6 hours   Dorrene German 05/14/2019,6:27 AM

## 2019-05-14 NOTE — ED Notes (Signed)
Date and time results received: 05/14/19 12:54  Test: Troponin  Critical Value: 6,112  Name of Provider Notified: Santiago Glad, [facility] is paging provider.   Orders Received? Or Actions Taken?: Will continue to monitor and await new orders.

## 2019-05-14 NOTE — H&P (Signed)
TRH H&P    Patient Demographics:    Kyle Barber, is a 58 y.o. male  MRN: [id number]  DOB - 11Dorna Barber/02/1961  Admit Date - 05/14/2019  Referring MD/NP/PA: Drema PryPedro Cardama  Outpatient Primary MD for the patient is Carmelia RollerWendling, Jilda RocheNicholas Paul, DO  Patient coming from: home  Chief complaint-  Epigastric pain/ lower chest pain   HPI:    Kyle MaiSteven Barber  is a 58 y.o. male smoker w Dm2, ETOH dep, h/o pancreatitis,  woke up at Midnite and arms and shoulder were aching.  Epigastric /lower chest, burning per patient. Burping some.   + cough, white sputum.  Pt denies fever, chills, palp, sob beyond baseline, n/v, diarrhea, brbpr, black stool. Nitro x2 did not help ease his chest discomfort.  Prior stress test but in the remote past.    In ED,  T 97.8, P 74, R 20, Bp 176/108  pxo 96% on RA Wt 91.2kg  CXR IMPRESSION: No active cardiopulmonary disease.  Na 137, K 5.6,  Bun 16, Creatinine 1.20 Wbc 9.1, Hgb 17.5, Plt 195 Ast 49, Alt 50  Trop 24-> 260  Ekg nsr at 75, nl axis, nl int, poor R progression, hint of st depression in the v4-6 lead  ED spoke with cardiology fellow who will consult .  I have contacted cardiology PA regarding increase in trop  Pt will be admitted for chest pain, NSTEMI    Review of systems:    In addition to the HPI above,  No Fever-chills, No Headache, No changes with Vision or hearing, No problems swallowing food or Liquids,   No Abdominal pain, No Nausea or Vomiting, bowel movements are regular, No Blood in stool or Urine, No dysuria, No new skin rashes or bruises, No new joints pains-aches,  No new weakness, tingling, numbness in any extremity, No recent weight gain or loss, No polyuria, polydypsia or polyphagia, No significant Mental Stressors.  All other systems reviewed and are negative.    Past History of the following :    Past Medical History:  Diagnosis Date    Asthma 02/27/2016   Diabetes mellitus without complication (HCC)    Pancreatitis    Premature ventricular contraction       Past Surgical History:  Procedure Laterality Date   APPENDECTOMY     HERNIA REPAIR        Social History:      Social History   Tobacco Use   Smoking status: Former Smoker    Packs/day: 0.25    Years: 10.00    Pack years: 2.50    Types: Cigarettes    Quit date: 07/01/2011    Years since quitting: 7.8   Smokeless tobacco: Former NeurosurgeonUser    Quit date: 06/29/2011  Substance Use Topics   Alcohol use: Yes    Comment: weekly       Family History :     Family History  Problem Relation Age of Onset   Emphysema Mother    Diabetes Father    Diabetes Paternal Grandmother  Home Medications:   Prior to Admission medications   Medication Sig Start Date End Date Taking? Authorizing Provider  albuterol (VENTOLIN HFA) 108 (90 Base) MCG/ACT inhaler Inhale 1-2 puffs into the lungs every 6 (six) hours as needed for wheezing or shortness of breath. 04/10/19  Yes Sharlene Dory, DO  amLODipine (NORVASC) 5 MG tablet TAKE 1 TABLET BY MOUTH EVERY DAY Patient taking differently: Take 5 mg by mouth daily.  12/04/18  Yes Sharlene Dory, DO  glucose blood test strip Test blood sugars 2 times daily. 04/05/18  Yes Sharlene Dory, DO  Semaglutide,0.25 or 0.5MG /DOS, (OZEMPIC, 0.25 OR 0.5 MG/DOSE,) 2 MG/1.5ML SOPN Inject 0.5 mg as directed once a week. 03/20/19  Yes Sharlene Dory, DO  sildenafil (VIAGRA) 100 MG tablet Take 1 tablet (100 mg total) by mouth daily as needed for erectile dysfunction. 01/22/19  Yes Sharlene Dory, DO     Allergies:     Allergies  Allergen Reactions   Codeine Nausea Only     Physical Exam:   Vitals  Blood pressure (!) 187/76, pulse 75, temperature 97.8 F (36.6 C), temperature source Oral, resp. rate 20, height 5\' 8"  (1.727 m), weight 91.2 kg, SpO2 92 %.  1.   General: axoxo3  2. Psychiatric: euthymic  3. Neurologic: Cn2-12 intact, reflexes 2+ symmetric, diffuse with no clonus, motor 5/5 in all 4 ext  4. HEENMT:  Anicteric, pupils 1.98mm symmetric, direct, consensual, near intact Neck: no jvd  5. Respiratory : CTAB  6. Cardiovascular : rrr s1, s2, no mg/r  Pt points to xyphoid area as the location of his chest pain   7. Gastrointestinal:  Abd: soft, nt, nd, +bs  8. Skin:  Ext: no c/c/e, no rash  9.Musculoskeletal:  Good ROM    Data Review:    CBC Recent Labs  Lab 05/14/19 0332  WBC 9.1  HGB 17.5*  HCT 49.7  PLT 195  MCV 96.3  MCH 33.9  MCHC 35.2  RDW 11.4*   ------------------------------------------------------------------------------------------------------------------  Results for orders placed or performed during the hospital encounter of 05/14/19 (from the past 48 hour(s))  Basic metabolic panel     Status: Abnormal   Collection Time: 05/14/19  3:32 AM  Result Value Ref Range   Sodium 137 135 - 145 mmol/L   Potassium 5.6 (H) 3.5 - 5.1 mmol/L   Chloride 100 98 - 111 mmol/L   CO2 25 22 - 32 mmol/L   Glucose, Bld 277 (H) 70 - 99 mg/dL   BUN 16 6 - 20 mg/dL   Creatinine, Ser 05/16/19 0.61 - 1.24 mg/dL   Calcium 9.0 8.9 - 9.50 mg/dL   GFR calc non Af Amer >60 >60 mL/min   GFR calc Af Amer >60 >60 mL/min   Anion gap 12 5 - 15    Comment: Performed at [facility], [address] [place], [place]  CBC     Status: Abnormal   Collection Time: 05/14/19  3:32 AM  Result Value Ref Range   WBC 9.1 4.0 - 10.5 K/uL   RBC 5.16 4.22 - 5.81 MIL/uL   Hemoglobin 17.5 (H) 13.0 - 17.0 g/dL   HCT 05/16/19 58.0 - 99.8 %   MCV 96.3 80.0 - 100.0 fL   MCH 33.9 26.0 - 34.0 pg   MCHC 35.2 30.0 - 36.0 g/dL   RDW 33.8 (L) 25.0 - 53.9 %   Platelets 195 150 - 400 K/uL   nRBC 0.0 0.0 -  0.2 %    Comment: Performed at [facility], [address] [place], [place] [address]  Troponin I  (High Sensitivity)     Status: Abnormal   Collection Time: 05/14/19  3:32 AM  Result Value Ref Range   Troponin I (High Sensitivity) 24 (H) <18 ng/L    Comment: (NOTE) Elevated high sensitivity troponin I (hsTnI) values and significant  changes across serial measurements may suggest ACS but many other  chronic and acute conditions are known to elevate hsTnI results.  Refer to the "Links" section for chest pain algorithms and additional  guidance. Performed at [facility], [address], [place], [place] [address]   Hepatic function panel     Status: Abnormal   Collection Time: 05/14/19  4:02 AM  Result Value Ref Range   Total Protein 7.2 6.5 - 8.1 g/dL   Albumin 4.2 3.5 - 5.0 g/dL   AST 49 (H) 15 - 41 U/L   ALT 50 (H) 0 - 44 U/L   Alkaline Phosphatase 53 38 - 126 U/L   Total Bilirubin 2.0 (H) 0.3 - 1.2 mg/dL   Bilirubin, Direct 0.7 (H) 0.0 - 0.2 mg/dL   Indirect Bilirubin 1.3 (H) 0.3 - 0.9 mg/dL    Comment: Performed at [facility], [address] [place], [place] [address]  Lipase     Status: None   Collection Time: 05/14/19  4:02 AM  Result Value Ref Range   Lipase 48 11 - 51 U/L    Comment: Performed at [facility], [address] [place], [place] [address]    Chemistries  Recent Labs  Lab 05/14/19 [phone] 05/14/19 0402  NA 137  --   K 5.6*  --   CL 100  --   CO2 25  --   GLUCOSE 277*  --   BUN 16  --   CREATININE 1.20  --   CALCIUM 9.0  --   AST  --  49*  ALT  --  50*  ALKPHOS  --  53  BILITOT  --  2.0*   ------------------------------------------------------------------------------------------------------------------  ------------------------------------------------------------------------------------------------------------------ GFR: Estimated Creatinine Clearance: 71.8 mL/min (by C-G formula based on SCr of 1.2 mg/dL). Liver Function Tests: Recent Labs  Lab 05/14/19 0402  AST 49*  ALT 50*   ALKPHOS 53  BILITOT 2.0*  PROT 7.2  ALBUMIN 4.2   Recent Labs  Lab 05/14/19 0402  LIPASE 48   No results for input(s): AMMONIA in the last 168 hours. Coagulation Profile: No results for input(s): INR, PROTIME in the last 168 hours. Cardiac Enzymes: No results for input(s): CKTOTAL, CKMB, CKMBINDEX, TROPONINI in the last 168 hours. BNP (last 3 results) No results for input(s): PROBNP in the last 8760 hours. HbA1C: No results for input(s): HGBA1C in the last 72 hours. CBG: No results for input(s): GLUCAP in the last 168 hours. Lipid Profile: No results for input(s): CHOL, HDL, LDLCALC, TRIG, CHOLHDL, LDLDIRECT in the last 72 hours. Thyroid Function Tests: No results for input(s): TSH, T4TOTAL, FREET4, T3FREE, THYROIDAB in the last 72 hours. Anemia Panel: No results for input(s): VITAMINB12, FOLATE, FERRITIN, TIBC, IRON, RETICCTPCT in the last 72 hours.  --------------------------------------------------------------------------------------------------------------- Urine analysis:    Component Value Date/Time   COLORURINE YELLOW 08/01/2011 1554   APPEARANCEUR CLEAR 08/01/2011 1554   LABSPEC 1.040 (H) 08/01/2011 1554   PHURINE 5.5 08/01/2011 1554   GLUCOSEU >1000 (A) 08/01/2011 1554   HGBUR NEGATIVE 08/01/2011 1554   BILIRUBINUR  LARGE (A) 08/01/2011 1554   KETONESUR >80 (A) 08/01/2011 1554   PROTEINUR 30 (A) 08/01/2011 1554   UROBILINOGEN 0.2 08/01/2011 1554   NITRITE NEGATIVE 08/01/2011 1554   LEUKOCYTESUR NEGATIVE 08/01/2011 1554      Imaging Results:    Dg Chest 2 View  Result Date: 05/14/2019 CLINICAL DATA:  Chest pain EXAM: CHEST - 2 VIEW COMPARISON:  11/27/2014 FINDINGS: Heart and mediastinal contours are within normal limits. No focal opacities or effusions. No acute bony abnormality. Multiple old right rib fractures again noted, unchanged. IMPRESSION: No active cardiopulmonary disease. Electronically Signed   By: Rolm Baptise M.D.   On: 05/14/2019 03:26        Assessment & Plan:    Principal Problem:   Chest pain Active Problems:   Cough  Chest pain NSTEMI Check Trop I at 1100 Check cpk, mb Check hga1c, lipid Check cardiac echo  Nitro gtt Heparin GTT Start carvedilol 3.125mg  po bid Start Lipitor 80mg  po qhs (monitor closely due to baseline abnormal liver function ) Start Hydralazine 10mg  iv q6h prn sbp >160 I notified cardiology of increase in troponin and they will consult regarding this patient, appreciate input  Abnormal liver function Check cpk Check acute hepatitis panel Check RUQ ultrasound Check cmp in am  Hyperkalemia Calcium gluconate 1gm iv x1 Sodium bicarbonate 1 amp iv x1 Kayexalate 15 gm po x1 Check cortisol Check bmp at 1100  ? epigastic discomfort Check lipase Start protonix 40mg  po qday  Dm2 STOP Ozempic due to ? Epigastric discomfort as well as hx of pancreatitis fsbs q4h, ISS  Tobacco use Pt counselled on smoking cessation x 3 minutes  Cough Please follow up on Covid -19 status Airborne and contact precautions, PUI  ETOH dep CIWA , though patient states that he can go without drinking for 1 week   DVT Prophylaxis-   Lovenox - SCDs   AM Labs Ordered, also please review Full Orders  Family Communication: Admission, patients condition and plan of care including tests being ordered have been discussed with the patient who indicate understanding and agree with the plan and Code Status.  Code Status:  FULL CODE per patient  Admission status: /Inpatient: Based on patients clinical presentation and evaluation of above clinical data, I have made determination that patient meets Inpatient criteria at this time.  Pt has ches tpain, and nstemi, and will require iv nitro and heparin, pt will require >2 nites stay.   Time spent in minutes : 70    Jani Gravel M.D on 05/14/2019 at 5:39 AM

## 2019-05-14 NOTE — Consult Note (Addendum)
Cardiology Consultation:   Patient ID: Kyle Barber MRN: [id number]; DOB: 19-Aug-1960  Admit date: 05/14/2019 Date of Consult: 05/14/2019  Primary Care Provider: Sharlene Dory, DO Primary Cardiologist: No primary care provider on file. New to [facility]- Dr. Elease Hashimoto Primary Electrophysiologist:  None    Patient Profile:   Kyle Barber is a 58 y.o. male with a hx of DM type 2, smoker, hypertension, asthma and daily alcohol use who is being seen today for the evaluation of chest pain and Non-STEMI at the request of Dr. Nelson Chimes.  History of Present Illness:   [name] has a past medical history as above. He presented to the hospital early this morning for the evaluation of epigastric/lower chest pain. He denies any prior cardiac issues. He had a stress test about 20-25 years ago in evaluation for panic attacks.  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 He usually has no chest discomfort. He has mild shortness of breath with walking a distances or up stairs that he attributes to asthma. He usually drinks 3-4 beers daily and smokes only socially a few times a month. He reports that at most in his past he has smoke 5-6 cigarettes per day.   Last night he had a glass of wine and then fell asleep on the couch at about 7 pm. He woke at about 10:30 and noted mild epigastric discomfort "not bad" and laid back down. At about midnight his epigastric pain became worse and he noted bilateral shoulder aching that went down both arms. He had no substernal chest pain/pressure. He denies having had any significant shortness of breath, nausea/vomiting, lightheadedness. He has some mild congestion and possible very mild dyspnea that he thinks is related to allergies. He notes that he has not had a BM in a few days and wondered if this could be pancreatitis. He reports that he had a bad episodes of pancreatitis about 7 years ago that required hospitalization.   His pain was rated as  7-8/10 at onset and has slowly improved overnight but is still constant, now 2-3/10 in the shoulder/arms. No current epigastric pain.   He notes that he had a late meal from [employer] on Saturday which he does not do often and he knows that this does not agree with him and his diabetes.   Chest xray showed no active cardiopulmonary disease. Troponins have risen from 24 to 260 LFTs are mildly elevated Renal function is normal with SCr 1.20. K+ is elevated at 5.6.  Hgb is high at 17.5 WBCs normal. Covid test in process.    Heart Pathway Score:     Past Medical History:  Diagnosis Date  . Asthma 02/27/2016  . Diabetes mellitus without complication (HCC)   . Hypertension   . Pancreatitis   . Premature ventricular contraction     Past Surgical History:  Procedure Laterality Date  . APPENDECTOMY    . HERNIA REPAIR       Home Medications:  Prior to Admission medications   Medication Sig Start Date End Date Taking? Authorizing Provider  albuterol (VENTOLIN HFA) 108 (90 Base) MCG/ACT inhaler Inhale 1-2 puffs into the lungs every 6 (six) hours as needed for wheezing or shortness of breath. 04/10/19  Yes Sharlene Dory, DO  amLODipine (NORVASC) 5 MG tablet TAKE 1 TABLET BY MOUTH EVERY DAY Patient taking differently: Take 5 mg by mouth daily.  12/04/18  Yes Sharlene Dory, DO  glucose blood test  strip Test blood sugars 2 times daily. 04/05/18  Yes Sharlene DoryWendling, Nicholas Paul, DO  Semaglutide,0.25 or 0.5MG /DOS, (OZEMPIC, 0.25 OR 0.5 MG/DOSE,) 2 MG/1.5ML SOPN Inject 0.5 mg as directed once a week. 03/20/19  Yes Sharlene DoryWendling, Nicholas Paul, DO  sildenafil (VIAGRA) 100 MG tablet Take 1 tablet (100 mg total) by mouth daily as needed for erectile dysfunction. 01/22/19  Yes Sharlene DoryWendling, Nicholas Paul, DO    Inpatient Medications: Scheduled Meds: . aspirin  325 mg Oral Daily  . atorvastatin  80 mg Oral q1800  . carvedilol  3.125 mg Oral BID WC  . folic acid  1 mg Oral Daily  . insulin aspart   0-9 Units Subcutaneous Q4H  . LORazepam  0-4 mg Intravenous Q4H   Followed by  . [START ON 05/16/2019] LORazepam  0-4 mg Intravenous Q8H  . multivitamin with minerals  1 tablet Oral Daily  . pantoprazole  40 mg Oral Daily  . thiamine  100 mg Oral Daily   Or  . thiamine  100 mg Intravenous Daily   Continuous Infusions: . sodium chloride 50 mL/hr at 05/14/19 0646  . heparin 900 Units/hr (05/14/19 0805)  . nitroGLYCERIN 10 mcg/min (05/14/19 0803)   PRN Meds: acetaminophen **OR** acetaminophen, hydrALAZINE, LORazepam **OR** LORazepam, nitroGLYCERIN  Allergies:    Allergies  Allergen Reactions  . Codeine Nausea Only    Social History:   Social History   Socioeconomic History  . Marital status: Divorced    Spouse name: Not on file  . Number of children: Not on file  . Years of education: Not on file  . Highest education level: Not on file  Occupational History  . Not on file  Social Needs  . Financial resource strain: Not on file  . Food insecurity    Worry: Not on file    Inability: Not on file  . Transportation needs    Medical: Not on file    Non-medical: Not on file  Tobacco Use  . Smoking status: Former Smoker    Packs/day: 0.25    Years: 10.00    Pack years: 2.50    Types: Cigarettes    Quit date: 07/01/2011    Years since quitting: 7.8  . Smokeless tobacco: Former NeurosurgeonUser    Quit date: 06/29/2011  Substance and Sexual Activity  . Alcohol use: Yes    Comment: weekly  . Drug use: No  . Sexual activity: Not on file  Lifestyle  . Physical activity    Days per week: Not on file    Minutes per session: Not on file  . Stress: Not on file  Relationships  . Social Musicianconnections    Talks on phone: Not on file    Gets together: Not on file    Attends religious service: Not on file    Active member of club or organization: Not on file    Attends meetings of clubs or organizations: Not on file    Relationship status: Not on file  . Intimate partner violence    Fear  of current or ex partner: Not on file    Emotionally abused: Not on file    Physically abused: Not on file    Forced sexual activity: Not on file  Other Topics Concern  . Not on file  Social History Narrative  . Not on file    Family History:    Family History  Problem Relation Age of Onset  . Emphysema Mother   . Diabetes Father   .  CAD Father        Died of MI at age 57  . Diabetes Paternal Grandmother   . CAD Paternal Grandfather        died at age 51     ROS:  Please see the history of present illness.   All other ROS reviewed and negative.     Physical Exam/Data:   Vitals:   05/14/19 0702 05/14/19 0710 05/14/19 0805 05/14/19 0815  BP: 139/65 (!) 161/79 (!) 158/79 (!) 176/93  Pulse:  79 80 78  Resp:  17 19 (!) 22  Temp:      TempSrc:      SpO2:  93% 92% 92%  Weight:      Height:       No intake or output data in the 24 hours ending 05/14/19 0846 Last 3 Weights 05/14/2019 01/22/2019 07/07/2018  Weight (lbs) 201 lb 201 lb 4 oz 201 lb  Weight (kg) 91.173 kg 91.286 kg 91.173 kg     Body mass index is 30.56 kg/m.  General:  Well nourished, well developed, in no acute distress HEENT: normal Lymph: no adenopathy Neck: no JVD Endocrine:  No thryomegaly Vascular: No carotid bruits; FA pulses 2+ bilaterally without bruits  Cardiac:  normal S1, S2; RRR; no murmur  Lungs:  clear to auscultation bilaterally, no wheezing, rhonchi or rales  Abd: soft, nontender, no hepatomegaly  Ext: no edema Musculoskeletal:  No deformities, BUE and BLE strength normal and equal Skin: warm and dry  Neuro:  CNs 2-12 intact, no focal abnormalities noted Psych:  Normal affect   EKG:  The EKG was personally reviewed and demonstrates:  Sinus rhythm, 74 bpm, new slight scooping ST depression in inferiolateral leads No STEMI. Telemetry:  Telemetry was personally reviewed and demonstrates:  Sinus rhythm in the 70's, no ectopy  Relevant CV Studies:  None  Laboratory Data:  High  Sensitivity Troponin:   Recent Labs  Lab 05/14/19 0332 05/14/19 0456  TROPONINIHS 24* 260*     Chemistry Recent Labs  Lab 05/14/19 0332  NA 137  K 5.6*  CL 100  CO2 25  GLUCOSE 277*  BUN 16  CREATININE 1.20  CALCIUM 9.0  GFRNONAA >60  GFRAA >60  ANIONGAP 12    Recent Labs  Lab 05/14/19 0402  PROT 7.2  ALBUMIN 4.2  AST 49*  ALT 50*  ALKPHOS 53  BILITOT 2.0*   Hematology Recent Labs  Lab 05/14/19 0332  WBC 9.1  RBC 5.16  HGB 17.5*  HCT 49.7  MCV 96.3  MCH 33.9  MCHC 35.2  RDW 11.4*  PLT 195   BNPNo results for input(s): BNP, PROBNP in the last 168 hours.  DDimer No results for input(s): DDIMER in the last 168 hours.   Radiology/Studies:  Dg Chest 2 View  Result Date: 05/14/2019 CLINICAL DATA:  Chest pain EXAM: CHEST - 2 VIEW COMPARISON:  11/27/2014 FINDINGS: Heart and mediastinal contours are within normal limits. No focal opacities or effusions. No acute bony abnormality. Multiple old right rib fractures again noted, unchanged. IMPRESSION: No active cardiopulmonary disease. Electronically Signed   By: Charlett Nose M.D.   On: 05/14/2019 03:26   US Abdomen Limited Ruq  Result Date: 05/14/2019 CLINICAL DATA:  Abnormal liver function test. EXAM: ULTRASOUND ABDOMEN LIMITED RIGHT UPPER QUADRANT COMPARISON:  August 02, 2011. FINDINGS: Gallbladder: No gallstones or wall thickening visualized. No sonographic Murphy sign noted by sonographer. Common bile duct: Diameter: 2 mm which is within normal limits. Liver:  No focal lesion identified. Increased echogenicity of hepatic parenchyma is noted suggesting hepatic steatosis. Portal vein is patent on color Doppler imaging with normal direction of blood flow towards the liver. Other: None. IMPRESSION: Probable hepatic steatosis. No other abnormality seen in the right upper quadrant of the abdomen. Electronically Signed   By: Marijo Conception M.D.   On: 05/14/2019 07:11    Assessment and Plan:   Non-STEMI -Pt with  onset of epigastric pain followed by aching of both shoulders going down the arms. No substernal chest pain or shortness of breath. Constant, has improved from 7-8/10 to 2-3/10.  -CVD risk factors include DM, smoker, hypertension, obesity -EKG with slight scooping ST depressions inferolaterally. I will obtain another EKG to assess for dynamic changes.  -Chest xray showed no active cardiopulmonary disease. -High sensitivity Troponins have risen from 24 to 260. Follow up level is scheduled for 11:00.  -Renal function is normal.  -Pt has been given aspirin. High intensity statin ordered as well as IV heparin and IN NTG.  -Echocardiogram has been ordered.  -His discomfort is concerning for radiation down the arms, but is constant without significant EKG changes. With mild EKG changes and elevated troponin levels, will Plan for cardiac catheterization to definitively evaluate coronary arteries. Dr. Acie Fredrickson will see pt wither here at [facility] or when he gets to [facility] for cath.  -The patient understands that risks include but are not limited to stroke (1 in 1000), death (1 in 59), kidney failure [usually temporary] (1 in 500), bleeding (1 in 200), allergic reaction [possibly serious] (1 in 200).   -Covid test is pending and expected to be complete at 1:06 pm.  -Pt is hemodynamically stable.   Epigastric pain -Mildly elevated LFTs. Lipase is normal. Primary team checking hepatitis panel.  -Abd US showed probable hepatic steatosis.   Diabetes type 2 -A1c 7.0. On Semaglutide per PCP, being stopped due to epigastric pain discomfort and hx of pancreatitis.   Hypertension -Blood pressure is elevated. Home meds include amlodipine 5 mg daily. Pt has been switched to carvedilol 3.125 mg BID per primary team. Hydralazine ordered for PRN use.   Hyperlipidemia -In setting of NSTEMI -high intensity statin has been started with atorvastatin 80 mg daily -Last lipid panel in 03/2018 with hypertriglyceridemia, trig 555.  Direct LDL 60, LDL 33.9, TC 166. Updated lipid panel has been ordered.  -Will need to check lipid panel and LFTs in 6 weeks following initiation of statin.    Hyperkalemia -K+ 5.6 on presentation. Pt is ordered calcium gluconate, sodium bicarb and kayexalate. Would recheck potassium level.  -Primary team is also checking cortisol.   Tobacco abuse -Pt has been a light smoker with 5-6 cigarettes per day in the past and now he reports only occasionally in social situations.  -Advise on complete cessation.   Alcohol use -Pt reports consumption of 3-4 beers per day and occasionally wine.  -Advised to reduce or ideally eliminate alcohol especially in light of his diabetes.       For questions or updates, please contact [place] Please consult [email or web address] for contact info under     Signed, Daune Perch, NP  05/14/2019 8:46 AM  Attending Note:   The patient was seen and examined.  Agree with assessment and plan as noted above.  Changes made to the above note as needed.  Patient seen and independently examined with Pecolia Ades, NP .   We discussed all aspects of the encounter. I agree with the assessment  and plan as stated above.  1.   Unstable angina: Patient presents with symptoms that could be an angina equivalent.  He is having abdominal pain with radiation to the shoulder and down to his left arm.  The pain started at midnight last night.  His EKG upon arrival to the [place] emergency room revealed some scooping ST depression.  With nitroglycerin this ST depression seems to have resolved slightly.  He still having some pain. Troponin levels have gone from 24 earlier this morning to 260 several hours later.  I think our best course of action is to proceed with heart catheterization.  Covid test is in progress.  We discussed the risks, benefits, options regarding heart catheterization.  He understands and agrees agrees to proceed.  We have discussed smoking cessation.   [facility] once covid test is found to be negative    I have spent a total of 40 minutes with patient reviewing hospital  notes , telemetry, EKGs, labs and examining patient as well as establishing an assessment and plan that was discussed with the patient. > 50% of time was spent in direct patient care.    Amoree Newlon J. Niah Heinle, Jr., MD, FACC 05/14/2019, 11:25 AM [address],  [address] Office - [phone] Pager [phone]    

## 2019-05-14 NOTE — Progress Notes (Addendum)
Kyle Barber is a 58 y.o. male with a hx of DM type 2, smoker, hypertension, asthma and daily alcohol use  who presented to [facility] long ED with worsening epigastric pain, radiating to both shoulders and arms, left more than right and associated mild shortness of breath. EKG shows ST depression in inferolateral leads and troponin started rising from 24-260.  Mildly elevated liver enzymes with negative lipase. Patient was given aspirin and started on heparin GTT. Cardiology was consulted-we appreciate the recommendations.  Patient is currently being treated for NSTEMI.  When seen this morning he was feeling better, epigastric pain has been resolved but continues to experience discomfort on his shoulders and both arms, left little more than the right.  He was getting his echocardiogram. Physical exam was within normal limits. -Next troponin is due at 11 AM. -Patient will be transferred to [facility] when the bed become available for cardiac catheterization. -Continue IV heparin.  Received a page from nurse regarding recent troponin increase, it was [id number]. Patient is still in [facility] long ED and waiting for transfer to [facility] for cardiac catheterization and possible PCI. We will try to expedite the process.

## 2019-05-14 NOTE — ED Notes (Signed)
Spoke with [facility], they are aware of transport to [facility], but no trucks are available. [facility] will call when available.

## 2019-05-14 NOTE — ED Notes (Signed)
Informed Dr. Lorella Nimrod of patients critical Tropinion. She requested cardiology to be notified.

## 2019-05-14 NOTE — ED Notes (Signed)
Cardiology services present and state that pt would be going for Cardiac Cath after 12p and that the lab would make contact when they were ready, as they were waiting for covid test to result.

## 2019-05-14 NOTE — Interval H&P Note (Signed)
Cath Lab Visit (complete for each Cath Lab visit)  Clinical Evaluation Leading to the Procedure:   ACS: Yes.    Non-ACS:    Anginal Classification: CCS IV  Anti-ischemic medical therapy: Minimal Therapy (1 class of medications)  Non-Invasive Test Results: No non-invasive testing performed  Prior CABG: No previous CABG      History and Physical Interval Note:  05/14/2019 3:13 PM  Kyle Barber  has presented today for surgery, with the diagnosis of n stemi.  The various methods of treatment have been discussed with the patient and family. After consideration of risks, benefits and other options for treatment, the patient has consented to  Procedure(s): LEFT HEART CATH AND CORONARY ANGIOGRAPHY (N/A) as a surgical intervention.  The patient's history has been reviewed, patient examined, no change in status, stable for surgery.  I have reviewed the patient's chart and labs.  Questions were answered to the patient's satisfaction.     Sherren Mocha

## 2019-05-14 NOTE — ED Notes (Signed)
Date and time results received: 05/14/19 0605 (use smartphrase ".now" to insert current time)  Test: Trop Critical Value: 260  Name of Provider Notified: Maudie Mercury, MD  Orders Received? Or Actions Taken?: Orders Received - See Orders for details

## 2019-05-14 NOTE — ED Notes (Signed)
Larena Glassman, Cardiology manager states they are aware of the troponin and patient will be transported to cath lab. I inquired about transport. She states that [facility] should be calling in the next 30 minutes.

## 2019-05-14 NOTE — ED Provider Notes (Signed)
[facility]-EMERGENCY DEPT Provider Note  CSN: [id number] Arrival date & time: 05/14/19 0246  Chief Complaint(s) Chest Pain  HPI Kyle Barber is a 58 y.o. male    Chest Pain Pain location:  Substernal area Pain quality: aching   Pain radiates to:  L arm and R arm Pain severity:  Moderate Onset quality:  Gradual Duration:  3 hours Timing:  Constant Progression:  Waxing and waning Chronicity:  New Relieved by:  Nothing Exacerbated by: resting. Associated symptoms: no anxiety, no cough, no nausea, no shortness of breath and no vomiting     Past Medical History Past Medical History:  Diagnosis Date  . Asthma 02/27/2016  . Diabetes mellitus without complication (HCC)   . Pancreatitis   . Premature ventricular contraction    Patient Active Problem List   Diagnosis Date Noted  . Chest pain 05/14/2019  . Screen for colon cancer 04/06/2018  . Diabetes mellitus type 2 in obese (HCC) 02/27/2016  . Asthma 02/27/2016  . Elevated triglycerides with high cholesterol 08/05/2011  . Alcoholism (HCC) 08/01/2011   Home Medication(s) Prior to Admission medications   Medication Sig Start Date End Date Taking? Authorizing Provider  albuterol (VENTOLIN HFA) 108 (90 Base) MCG/ACT inhaler Inhale 1-2 puffs into the lungs every 6 (six) hours as needed for wheezing or shortness of breath. 04/10/19  Yes Sharlene Dory, DO  amLODipine (NORVASC) 5 MG tablet TAKE 1 TABLET BY MOUTH EVERY DAY Patient taking differently: Take 5 mg by mouth daily.  12/04/18  Yes Sharlene Dory, DO  glucose blood test strip Test blood sugars 2 times daily. 04/05/18  Yes Sharlene Dory, DO  Semaglutide,0.25 or 0.5MG /DOS, (OZEMPIC, 0.25 OR 0.5 MG/DOSE,) 2 MG/1.5ML SOPN Inject 0.5 mg as directed once a week. 03/20/19  Yes Sharlene Dory, DO  sildenafil (VIAGRA) 100 MG tablet Take 1 tablet (100 mg total) by mouth daily as needed for erectile dysfunction. 01/22/19  Yes  Sharlene Dory, DO                                                                                                                                    Past Surgical History Past Surgical History:  Procedure Laterality Date  . APPENDECTOMY    . HERNIA REPAIR     Family History Family History  Problem Relation Age of Onset  . Emphysema Mother   . Diabetes Father   . Diabetes Paternal Grandmother     Social History Social History   Tobacco Use  . Smoking status: Former Smoker    Packs/day: 0.25    Years: 10.00    Pack years: 2.50    Types: Cigarettes    Quit date: 07/01/2011    Years since quitting: 7.8  . Smokeless tobacco: Former Neurosurgeon    Quit date: 06/29/2011  Substance Use Topics  . Alcohol use: Yes    Comment: weekly  .  Drug use: No   Allergies Codeine  Review of Systems Review of Systems  Respiratory: Negative for cough and shortness of breath.   Cardiovascular: Positive for chest pain.  Gastrointestinal: Negative for nausea and vomiting.   All other systems are reviewed and are negative for acute change except as noted in the HPI  Physical Exam Vital Signs  I have reviewed the triage vital signs BP (!) 176/108 (BP Location: Right Arm)   Pulse 74   Temp 97.8 F (36.6 C) (Oral)   Resp 20   Ht  (1.727 m)   Wt 91.2 kg   SpO2 96%   BMI 30.56 kg/m   Physical Exam Vitals signs reviewed.  Constitutional:      General: He is not in acute distress.    Appearance: He is well-developed. He is not diaphoretic.  HENT:     Head: Normocephalic and atraumatic.     Nose: Nose normal.  Eyes:     General: No scleral icterus.       Right eye: No discharge.        Left eye: No discharge.     Conjunctiva/sclera: Conjunctivae normal.     Pupils: Pupils are equal, round, and reactive to light.  Neck:     Musculoskeletal: Normal range of motion and neck supple.  Cardiovascular:     Rate and Rhythm: Normal rate and regular rhythm.     Heart sounds: No  murmur. No friction rub. No gallop.   Pulmonary:     Effort: Pulmonary effort is normal. No respiratory distress.     Breath sounds: Normal breath sounds. No stridor. No rales.  Abdominal:     General: There is no distension.     Palpations: Abdomen is soft.     Tenderness: There is abdominal tenderness in the epigastric area. There is no guarding or rebound.  Musculoskeletal:        General: No tenderness.  Skin:    General: Skin is warm and dry.     Findings: No erythema or rash.  Neurological:     Mental Status: He is alert and oriented to person, place, and time.     ED Results and Treatments Labs (all labs ordered are listed, but only abnormal results are displayed) Labs Reviewed  BASIC METABOLIC PANEL - Abnormal; Notable for the following components:      Result Value   Potassium 5.6 (*)    Glucose, Bld 277 (*)    All other components within normal limits  CBC - Abnormal; Notable for the following components:   Hemoglobin 17.5 (*)    RDW 11.4 (*)    All other components within normal limits  HEPATIC FUNCTION PANEL - Abnormal; Notable for the following components:   AST 49 (*)    ALT 50 (*)    Total Bilirubin 2.0 (*)    Bilirubin, Direct 0.7 (*)    Indirect Bilirubin 1.3 (*)    All other components within normal limits  TROPONIN I (HIGH SENSITIVITY) - Abnormal; Notable for the following components:   Troponin I (High Sensitivity) 24 (*)    All other components within normal limits  SARS CORONAVIRUS 2 (TAT 6-24 HRS)  LIPASE, BLOOD  TROPONIN I (HIGH SENSITIVITY)  EKG  EKG Interpretation  Date/Time:  Monday May 14 2019 02:54:26 EST Ventricular Rate:  75 PR Interval:    QRS Duration: 96 QT Interval:  400 QTC Calculation: 447 R Axis:   76 Text Interpretation: Sinus or ectopic atrial rhythm new ST depression in inferiolateral leads No STEMI.  Confirmed by Drema Pry [phone]) on 05/14/2019 4:28:25 AM      Radiology Dg Chest 2 View  Result Date: 05/14/2019 CLINICAL DATA:  Chest pain EXAM: CHEST - 2 VIEW COMPARISON:  11/27/2014 FINDINGS: Heart and mediastinal contours are within normal limits. No focal opacities or effusions. No acute bony abnormality. Multiple old right rib fractures again noted, unchanged. IMPRESSION: No active cardiopulmonary disease. Electronically Signed   By: Charlett Nose M.D.   On: 05/14/2019 03:26    Pertinent labs & imaging results that were available during my care of the patient were reviewed by me and considered in my medical decision making (see chart for details).  Medications Ordered in ED Medications  nitroGLYCERIN (NITROSTAT) SL tablet 0.4 mg (0.4 mg Sublingual Given 05/14/19 0455)  sodium chloride flush (NS) 0.9 % injection 3 mL (3 mLs Intravenous Given 05/14/19 0331)  alum & mag hydroxide-simeth (MAALOX/MYLANTA) 200-200-20 MG/5ML suspension 30 mL (30 mLs Oral Given 05/14/19 0416)    And  lidocaine (XYLOCAINE) 2 % viscous mouth solution 15 mL (15 mLs Oral Given 05/14/19 0416)                                                                                                                                    Procedures .Critical Care Performed by: Nira Conn, MD Authorized by: Nira Conn, MD    CRITICAL CARE Performed by: Amadeo Garnet Cardama Total critical care time: 35 minutes Critical care time was exclusive of separately billable procedures and treating other patients. Critical care was necessary to treat or prevent imminent or life-threatening deterioration. Critical care was time spent personally by me on the following activities: development of treatment plan with patient and/or surrogate as well as nursing, discussions with consultants, evaluation of patient's response to treatment, examination of patient, obtaining history from patient or surrogate, ordering  and performing treatments and interventions, ordering and review of laboratory studies, ordering and review of radiographic studies, pulse oximetry and re-evaluation of patient's condition.    (including critical care time)  Medical Decision Making / ED Course I have reviewed the nursing notes for this encounter and the patient's prior records (if available in EHR or on provided paperwork).   Kyle Barber was evaluated in Emergency Department on 05/14/2019 for the symptoms described in the history of present illness. He was evaluated in the context of the global COVID-19 pandemic, which necessitated consideration that the patient might be at risk for infection with the SARS-CoV-2 virus that causes COVID-19. Institutional protocols and algorithms that pertain to the evaluation of patients at risk for COVID-19 are in a state  of rapid change based on information released by regulatory bodies including the CDC and federal and state organizations. These policies and algorithms were followed during the patient's care in the ED.  Substernal chest pain with radiation to bilateral extremities. Not consistent with prior pancreatitis. EKG with new ST segment depression in inferior lateral leads.  Initial troponin mildly elevated at 24.  Will need admission to continue trending troponins to assess for likely NSTEMI.  Low suspicion for pulmonary embolism.  Presentation not classic for aortic dissection or esophageal perforation.  Chest x-ray without evidence suggestive of pneumonia, pneumothorax, pneumomediastinum.  No abnormal contour of the mediastinum to suggest dissection. No evidence of acute injuries.  Cardiology consulted who will see the patient in the morning.  Admitted to medicine for continued work-up and management.      Final Clinical Impression(s) / ED Diagnoses Final diagnoses:  NSTEMI (non-ST elevated myocardial infarction) [facility])      This chart was dictated using voice  recognition software.  Despite best efforts to proofread,  errors can occur which can change the documentation meaning.   Fatima Blank, MD 05/14/19 [phone]

## 2019-05-14 NOTE — ED Notes (Addendum)
Kyle Barber was given report at [facility]

## 2019-05-15 ENCOUNTER — Telehealth: Payer: Self-pay | Admitting: Cardiovascular Disease

## 2019-05-15 ENCOUNTER — Encounter (HOSPITAL_COMMUNITY): Payer: Self-pay | Admitting: Cardiovascular Disease

## 2019-05-15 DIAGNOSIS — I25110 Atherosclerotic heart disease of native coronary artery with unstable angina pectoris: Secondary | ICD-10-CM

## 2019-05-15 DIAGNOSIS — E782 Mixed hyperlipidemia: Secondary | ICD-10-CM

## 2019-05-15 DIAGNOSIS — R079 Chest pain, unspecified: Secondary | ICD-10-CM

## 2019-05-15 LAB — COMPREHENSIVE METABOLIC PANEL
ALT: 41 U/L (ref 0–44)
AST: 83 U/L — ABNORMAL HIGH (ref 15–41)
Albumin: 3.5 g/dL (ref 3.5–5.0)
Alkaline Phosphatase: 44 U/L (ref 38–126)
Anion gap: 11 (ref 5–15)
BUN: <5 mg/dL — ABNORMAL LOW (ref 6–20)
CO2: 24 mmol/L (ref 22–32)
Calcium: 8.7 mg/dL — ABNORMAL LOW (ref 8.9–10.3)
Chloride: 103 mmol/L (ref 98–111)
Creatinine, Ser: 0.62 mg/dL (ref 0.61–1.24)
GFR calc Af Amer: >60 mL/min (ref >60)
GFR calc non Af Amer: >60 mL/min (ref >60)
Glucose, Bld: 164 mg/dL — ABNORMAL HIGH (ref 70–99)
Potassium: 2.9 mmol/L — ABNORMAL LOW (ref 3.5–5.1)
Sodium: 138 mmol/L (ref 135–145)
Total Bilirubin: 1.3 mg/dL — ABNORMAL HIGH (ref 0.3–1.2)
Total Protein: 6.1 g/dL — ABNORMAL LOW (ref 6.5–8.1)

## 2019-05-15 LAB — LDL CHOLESTEROL, DIRECT: Direct LDL: 77.5 mg/dL (ref 0–99)

## 2019-05-15 LAB — CBC
HCT: 42.9 % (ref 39.0–52.0)
Hemoglobin: 15.5 g/dL (ref 13.0–17.0)
MCH: 34.1 pg — ABNORMAL HIGH (ref 26.0–34.0)
MCHC: 36.1 g/dL — ABNORMAL HIGH (ref 30.0–36.0)
MCV: 94.3 fL (ref 80.0–100.0)
Platelets: 168 10*3/uL (ref 150–400)
RBC: 4.55 MIL/uL (ref 4.22–5.81)
RDW: 11.3 % — ABNORMAL LOW (ref 11.5–15.5)
WBC: 10.2 10*3/uL (ref 4.0–10.5)
nRBC: 0 % (ref 0.0–0.2)

## 2019-05-15 LAB — LIPID PANEL
Cholesterol: 167 mg/dL (ref 0–200)
HDL: 32 mg/dL — ABNORMAL LOW (ref >40)
LDL Cholesterol: UNDETERMINED mg/dL (ref 0–99)
Total CHOL/HDL Ratio: 5.2 RATIO
Triglycerides: 414 mg/dL — ABNORMAL HIGH (ref <150)
VLDL: UNDETERMINED mg/dL (ref 0–40)

## 2019-05-15 LAB — GLUCOSE, CAPILLARY
Glucose-Capillary: 144 mg/dL — ABNORMAL HIGH (ref 70–99)
Glucose-Capillary: 154 mg/dL — ABNORMAL HIGH (ref 70–99)
Glucose-Capillary: 155 mg/dL — ABNORMAL HIGH (ref 70–99)
Glucose-Capillary: 161 mg/dL — ABNORMAL HIGH (ref 70–99)

## 2019-05-15 LAB — HIV ANTIBODY (ROUTINE TESTING W REFLEX): HIV Screen 4th Generation wRfx: NONREACTIVE — AB

## 2019-05-15 MED ORDER — NITROGLYCERIN 0.4 MG SL SUBL: 0.4000 mg | SUBLINGUAL_TABLET | SUBLINGUAL | 3 refills | Status: DC | PRN

## 2019-05-15 MED ORDER — TICAGRELOR 90 MG PO TABS: 90.0000 mg | ORAL_TABLET | Freq: Two times a day (BID) | ORAL | 3 refills | Status: DC

## 2019-05-15 MED ORDER — ASPIRIN 81 MG PO CHEW: 81.0000 mg | CHEWABLE_TABLET | Freq: Every day | ORAL | 3 refills | Status: DC

## 2019-05-15 MED ORDER — ATORVASTATIN CALCIUM 80 MG PO TABS: 80.0000 mg | ORAL_TABLET | Freq: Every day | ORAL | 3 refills | Status: DC

## 2019-05-15 MED ORDER — CARVEDILOL 6.25 MG PO TABS: 6.2500 mg | ORAL_TABLET | Freq: Two times a day (BID) | ORAL | Status: DC

## 2019-05-15 MED ORDER — TICAGRELOR 90 MG PO TABS: 90.0000 mg | ORAL_TABLET | Freq: Two times a day (BID) | ORAL | 0 refills | Status: DC

## 2019-05-15 MED ORDER — CARVEDILOL 6.25 MG PO TABS: 6.2500 mg | ORAL_TABLET | Freq: Two times a day (BID) | ORAL | 3 refills | Status: DC

## 2019-05-15 MED ORDER — CARVEDILOL 6.25 MG PO TABS
6.2500 mg | ORAL_TABLET | Freq: Once | ORAL | Status: AC
  Administered 2019-05-15: 11:00:00 6.25 mg via ORAL
  Filled 2019-05-15: qty 1

## 2019-05-15 MED ORDER — POTASSIUM CHLORIDE CRYS ER 20 MEQ PO TBCR
60.0000 meq | EXTENDED_RELEASE_TABLET | ORAL | Status: AC
  Administered 2019-05-15 (×2): 60 meq via ORAL
  Filled 2019-05-15 (×2): qty 3

## 2019-05-15 MED FILL — ATORVASTATIN CALCIUM 80 MG: 80 | 30 days supply | Qty: 30 | Fill #0

## 2019-05-15 MED FILL — BRILINTA 90 MG TABLET: 90 | 30 days supply | Qty: 60 | Fill #0

## 2019-05-15 MED FILL — CARVEDILOL 6.25 MG TABLET: 6.25 | 30 days supply | Qty: 60 | Fill #0

## 2019-05-15 MED FILL — NITROGLYCERIN 0.4 MG TAB SL: 0.4 | 8 days supply | Qty: 25 | Fill #0

## 2019-05-15 MED FILL — ASPIRIN LOW DOSE 81 MG CHEW: 81 | 90 days supply | Qty: 90 | Fill #0

## 2019-05-15 NOTE — Discharge Instructions (Signed)
PLEASE REMEMBER TO BRING ALL OF YOUR MEDICATIONS TO EACH OF YOUR FOLLOW-UP OFFICE VISITS.  PLEASE ATTEND ALL SCHEDULED FOLLOW-UP APPOINTMENTS.   Activity: Increase activity slowly as tolerated. You may shower, but no soaking baths (or swimming) for 1 week. No driving for 24 hours. No lifting over 5 lbs for 1 week. No sexual activity for 1 week.   You May Return to Work: in 1 week (if applicable)  Wound Care: You may wash cath site gently with soap and water. Keep cath site clean and dry. If you notice pain, swelling, bleeding or pus at your cath site, please call [phone].    Radial Site Care  This sheet gives you information about how to care for yourself after your procedure. Your health care provider may also give you more specific instructions. If you have problems or questions, contact your health care provider. What can I expect after the procedure? After the procedure, it is common to have:  Bruising and tenderness at the catheter insertion area. Follow these instructions at home: Medicines  Take over-the-counter and prescription medicines only as told by your health care provider. Insertion site care  Follow instructions from your health care provider about how to take care of your insertion site. Make sure you: ? Wash your hands with soap and water before you change your bandage (dressing). If soap and water are not available, use hand sanitizer. ? Change your dressing as told by your health care provider. ? Leave stitches (sutures), skin glue, or adhesive strips in place. These skin closures may need to stay in place for 2 weeks or longer. If adhesive strip edges start to loosen and curl up, you may trim the loose edges. Do not remove adhesive strips completely unless your health care provider tells you to do that.  Check your insertion site every day for signs of infection. Check for: ? Redness, swelling, or pain. ? Fluid or blood. ? Pus or a bad  smell. ? Warmth.  Do not take baths, swim, or use a hot tub until your health care provider approves.  You may shower 24-48 hours after the procedure, or as directed by your health care provider. ? Remove the dressing and gently wash the site with plain soap and water. ? Pat the area dry with a clean towel. ? Do not rub the site. That could cause bleeding.  Do not apply powder or lotion to the site. Activity   For 24 hours after the procedure, or as directed by your health care provider: ? Do not flex or bend the affected arm. ? Do not push or pull heavy objects with the affected arm. ? Do not drive yourself home from the hospital or clinic. You may drive 24 hours after the procedure unless your health care provider tells you not to. ? Do not operate machinery or power tools.  Do not lift anything that is heavier than 10 lb (4.5 kg), or the limit that you are told, until your health care provider says that it is safe.  Ask your health care provider when it is okay to: ? Return to work or school. ? Resume usual physical activities or sports. ? Resume sexual activity. General instructions  If the catheter site starts to bleed, raise your arm and put firm pressure on the site. If the bleeding does not stop, get help right away. This is a medical emergency.  If you went home on the same day as your procedure, a responsible adult should  be with you for the first 24 hours after you arrive home.  Keep all follow-up visits as told by your health care provider. This is important. Contact a health care provider if:  You have a fever.  You have redness, swelling, or yellow drainage around your insertion site. Get help right away if:  You have unusual pain at the radial site.  The catheter insertion area swells very fast.  The insertion area is bleeding, and the bleeding does not stop when you hold steady pressure on the area.  Your arm or hand becomes pale, cool, tingly, or  numb. These symptoms may represent a serious problem that is an emergency. Do not wait to see if the symptoms will go away. Get medical help right away. Call your local emergency services (911 in the [place] Do not drive yourself to the hospital. Summary  After the procedure, it is common to have bruising and tenderness at the site.  Follow instructions from your health care provider about how to take care of your radial site wound. Check the wound every day for signs of infection.  Do not lift anything that is heavier than 10 lb (4.5 kg), or the limit that you are told, until your health care provider says that it is safe. This information is not intended to replace advice given to you by your health care provider. Make sure you discuss any questions you have with your health care provider. Document Released: 07/17/2010 Document Revised: 07/20/2017 Document Reviewed: 07/20/2017 Elsevier Patient Education  2020 [employer].    Diabetes Mellitus and Nutrition, Adult When you have diabetes (diabetes mellitus), it is very important to have healthy eating habits because your blood sugar (glucose) levels are greatly affected by what you eat and drink. Eating healthy foods in the appropriate amounts, at about the same times every day, can help you:  Control your blood glucose.  Lower your risk of heart disease.  Improve your blood pressure.  Reach or maintain a healthy weight. Every person with diabetes is different, and each person has different needs for a meal plan. Your health care provider may recommend that you work with a diet and nutrition specialist (dietitian) to make a meal plan that is best for you. Your meal plan may vary depending on factors such as:  The calories you need.  The medicines you take.  Your weight.  Your blood glucose, blood pressure, and cholesterol levels.  Your activity level.  Other health conditions you have, such as heart or kidney disease. How do  carbohydrates affect me? Carbohydrates, also called carbs, affect your blood glucose level more than any other type of food. Eating carbs naturally raises the amount of glucose in your blood. Carb counting is a method for keeping track of how many carbs you eat. Counting carbs is important to keep your blood glucose at a healthy level, especially if you use insulin or take certain oral diabetes medicines. It is important to know how many carbs you can safely have in each meal. This is different for every person. Your dietitian can help you calculate how many carbs you should have at each meal and for each snack. Foods that contain carbs include:  Bread, cereal, rice, pasta, and crackers.  Potatoes and corn.  Peas, beans, and lentils.  Milk and yogurt.  Fruit and juice.  Desserts, such as cakes, cookies, ice cream, and candy. How does alcohol affect me? Alcohol can cause a sudden decrease in blood glucose (hypoglycemia), especially  if you use insulin or take certain oral diabetes medicines. Hypoglycemia can be a life-threatening condition. Symptoms of hypoglycemia (sleepiness, dizziness, and confusion) are similar to symptoms of having too much alcohol. If your health care provider says that alcohol is safe for you, follow these guidelines:  Limit alcohol intake to no more than 1 drink per day for nonpregnant women and 2 drinks per day for men. One drink equals 12 oz of beer, 5 oz of wine, or 1 oz of hard liquor.  Do not drink on an empty stomach.  Keep yourself hydrated with water, diet soda, or unsweetened iced tea.  Keep in mind that regular soda, juice, and other mixers may contain a lot of sugar and must be counted as carbs. What are tips for following this plan?  Reading food labels  Start by checking the serving size on the "Nutrition Facts" label of packaged foods and drinks. The amount of calories, carbs, fats, and other nutrients listed on the label is based on one serving of  the item. Many items contain more than one serving per package.  Check the total grams (g) of carbs in one serving. You can calculate the number of servings of carbs in one serving by dividing the total carbs by 15. For example, if a food has 30 g of total carbs, it would be equal to 2 servings of carbs.  Check the number of grams (g) of saturated and trans fats in one serving. Choose foods that have low or no amount of these fats.  Check the number of milligrams (mg) of salt (sodium) in one serving. Most people should limit total sodium intake to less than 2,300 mg per day.  Always check the nutrition information of foods labeled as "low-fat" or "nonfat". These foods may be higher in added sugar or refined carbs and should be avoided.  Talk to your dietitian to identify your daily goals for nutrients listed on the label. Shopping  Avoid buying canned, premade, or processed foods. These foods tend to be high in fat, sodium, and added sugar.  Shop around the outside edge of the grocery store. This includes fresh fruits and vegetables, bulk grains, fresh meats, and fresh dairy. Cooking  Use low-heat cooking methods, such as baking, instead of high-heat cooking methods like deep frying.  Cook using healthy oils, such as olive, canola, or sunflower oil.  Avoid cooking with butter, cream, or high-fat meats. Meal planning  Eat meals and snacks regularly, preferably at the same times every day. Avoid going long periods of time without eating.  Eat foods high in fiber, such as fresh fruits, vegetables, beans, and whole grains. Talk to your dietitian about how many servings of carbs you can eat at each meal.  Eat 4-6 ounces (oz) of lean protein each day, such as lean meat, chicken, fish, eggs, or tofu. One oz of lean protein is equal to: ? 1 oz of meat, chicken, or fish. ? 1 egg. ?  cup of tofu.  Eat some foods each day that contain healthy fats, such as avocado, nuts, seeds, and  fish. Lifestyle  Check your blood glucose regularly.  Exercise regularly as told by your health care provider. This may include: ? 150 minutes of moderate-intensity or vigorous-intensity exercise each week. This could be brisk walking, biking, or water aerobics. ? Stretching and doing strength exercises, such as yoga or weightlifting, at least 2 times a week.  Take medicines as told by your health care provider.  Do  not use any products that contain nicotine or tobacco, such as cigarettes and e-cigarettes. If you need help quitting, ask your health care provider.  Work with a Veterinary surgeon or diabetes educator to identify strategies to manage stress and any emotional and social challenges. Questions to ask a health care provider  Do I need to meet with a diabetes educator?  Do I need to meet with a dietitian?  What number can I call if I have questions?  When are the best times to check my blood glucose? Where to find more information:  American Diabetes Association: diabetes.org  Academy of Nutrition and Dietetics: www.eatright.[employer] of Diabetes and Digestive and Kidney Diseases (NIH): CarFli[employer].tn Summary  A healthy meal plan will help you control your blood glucose and maintain a healthy lifestyle.  Working with a diet and nutrition specialist (dietitian) can help you make a meal plan that is best for you.  Keep in mind that carbohydrates (carbs) and alcohol have immediate effects on your blood glucose levels. It is important to count carbs and to use alcohol carefully. This information is not intended to replace advice given to you by your health care provider. Make sure you discuss any questions you have with your health care provider. Document Released: 03/11/2005 Document Revised: 05/27/2017 Document Reviewed: 07/19/2016 Elsevier Patient Education  2020 Elsevier Inc.    Heart-Healthy Eating Plan Heart-healthy meal planning includes:  Eating  less unhealthy fats.  Eating more healthy fats.  Making other changes in your diet. Talk with your doctor or a diet specialist (dietitian) to create an eating plan that is right for you. What is my plan? Your doctor may recommend an eating plan that includes:  Total fat: ______% or less of total calories a day.  Saturated fat: ______% or less of total calories a day.  Cholesterol: less than _________mg a day. What are tips for following this plan? Cooking Avoid frying your food. Try to bake, boil, grill, or broil it instead. You can also reduce fat by:  Removing the skin from poultry.  Removing all visible fats from meats.  Steaming vegetables in water or broth. Meal planning   At meals, divide your plate into four equal parts: ? Fill one-half of your plate with vegetables and green salads. ? Fill one-fourth of your plate with whole grains. ? Fill one-fourth of your plate with lean protein foods.  Eat 4-5 servings of vegetables per day. A serving of vegetables is: ? 1 cup of raw or cooked vegetables. ? 2 cups of raw leafy greens.  Eat 4-5 servings of fruit per day. A serving of fruit is: ? 1 medium whole fruit. ?  cup of dried fruit. ?  cup of fresh, frozen, or canned fruit. ?  cup of 100% fruit juice.  Eat more foods that have soluble fiber. These are apples, broccoli, carrots, beans, peas, and barley. Try to get 20-30 g of fiber per day.  Eat 4-5 servings of nuts, legumes, and seeds per week: ? 1 serving of dried beans or legumes equals  cup after being cooked. ? 1 serving of nuts is  cup. ? 1 serving of seeds equals 1 tablespoon. General information  Eat more home-cooked food. Eat less restaurant, buffet, and fast food.  Limit or avoid alcohol.  Limit foods that are high in starch and sugar.  Avoid fried foods.  Lose weight if you are overweight.  Keep track of how much salt (sodium) you eat. This is important if  you have high blood pressure. Ask  your doctor to tell you more about this.  Try to add vegetarian meals each week. Fats  Choose healthy fats. These include olive oil and canola oil, flaxseeds, walnuts, almonds, and seeds.  Eat more omega-3 fats. These include salmon, mackerel, sardines, tuna, flaxseed oil, and ground flaxseeds. Try to eat fish at least 2 times each week.  Check food labels. Avoid foods with trans fats or high amounts of saturated fat.  Limit saturated fats. ? These are often found in animal products, such as meats, butter, and cream. ? These are also found in plant foods, such as palm oil, palm kernel oil, and coconut oil.  Avoid foods with partially hydrogenated oils in them. These have trans fats. Examples are stick margarine, some tub margarines, cookies, crackers, and other baked goods. What foods can I eat? Fruits All fresh, canned (in natural juice), or frozen fruits. Vegetables Fresh or frozen vegetables (raw, steamed, roasted, or grilled). Green salads. Grains Most grains. Choose whole wheat and whole grains most of the time. Rice and pasta, including brown rice and pastas made with whole wheat. Meats and other proteins Lean, well-trimmed beef, veal, pork, and lamb. Chicken and [place] without skin. All fish and shellfish. Wild duck, rabbit, pheasant, and venison. Egg whites or low-cholesterol egg substitutes. Dried beans, peas, lentils, and tofu. Seeds and most nuts. Dairy Low-fat or nonfat cheeses, including ricotta and mozzarella. Skim or 1% milk that is liquid, powdered, or evaporated. Buttermilk that is made with low-fat milk. Nonfat or low-fat yogurt. Fats and oils Non-hydrogenated (trans-free) margarines. Vegetable oils, including soybean, sesame, sunflower, olive, peanut, safflower, corn, canola, and cottonseed. Salad dressings or mayonnaise made with a vegetable oil. Beverages Mineral water. Coffee and tea. Diet carbonated beverages. Sweets and desserts Sherbet, gelatin, and fruit ice.  Small amounts of dark chocolate. Limit all sweets and desserts. Seasonings and condiments All seasonings and condiments. The items listed above may not be a complete list of foods and drinks you can eat. Contact a dietitian for more options. What foods should I avoid? Fruits Canned fruit in heavy syrup. Fruit in cream or butter sauce. Fried fruit. Limit coconut. Vegetables Vegetables cooked in cheese, cream, or butter sauce. Fried vegetables. Grains Breads that are made with saturated or trans fats, oils, or whole milk. Croissants. Sweet rolls. Donuts. High-fat crackers, such as cheese crackers. Meats and other proteins Fatty meats, such as hot dogs, ribs, sausage, bacon, rib-eye roast or steak. High-fat deli meats, such as salami and bologna. Caviar. Domestic duck and goose. Organ meats, such as liver. Dairy Cream, sour cream, cream cheese, and creamed cottage cheese. Whole-milk cheeses. Whole or 2% milk that is liquid, evaporated, or condensed. Whole buttermilk. Cream sauce or high-fat cheese sauce. Yogurt that is made from whole milk. Fats and oils Meat fat, or shortening. Cocoa butter, hydrogenated oils, palm oil, coconut oil, palm kernel oil. Solid fats and shortenings, including bacon fat, salt pork, lard, and butter. Nondairy cream substitutes. Salad dressings with cheese or sour cream. Beverages Regular sodas and juice drinks with added sugar. Sweets and desserts Frosting. Pudding. Cookies. Cakes. Pies. Milk chocolate or white chocolate. Buttered syrups. Full-fat ice cream or ice cream drinks. The items listed above may not be a complete list of foods and drinks to avoid. Contact a dietitian for more information. Summary  Heart-healthy meal planning includes eating less unhealthy fats, eating more healthy fats, and making other changes in your diet.  Eat a balanced diet.  This includes fruits and vegetables, low-fat or nonfat dairy, lean protein, nuts and legumes, whole grains, and  heart-healthy oils and fats. This information is not intended to replace advice given to you by your health care provider. Make sure you discuss any questions you have with your health care provider. Document Released: 12/14/2011 Document Revised: 08/18/2017 Document Reviewed: 07/22/2017 Elsevier Patient Education  2020 [employer].

## 2019-05-15 NOTE — TOC Benefit Eligibility Note (Signed)
Transition of Care [facility]) Benefit Eligibility Note    Patient Details  Name: Kyle Barber MRN: [id number] Date of Birth: Nov 08, 1960   Medication/Dose: Brilinta 90 mg po twice daily  Covered?: Yes  Tier: 2 Drug  Prescription Coverage Preferred [facility]acy: [facility]  Spoke with Person/Company/Phone Number:: Cierra/ [facility] [employer] / [phone]  Co-Pay: 57.58 30 day supply Retail/ 153.28 for a 90 day supply Mail Order  Prior Approval: No  Deductible: Met       Orbie Pyo Phone Number: 05/15/2019, 1:33 PM

## 2019-05-15 NOTE — Discharge Summary (Signed)
Physician Discharge Summary  [name] RUE:[id number] DOB: [birth year] DOA: 05/14/2019  PCP: Kyle Dory, DO  Admit date: 05/14/2019 Discharge date: 05/15/2019  Admitted From: Home Disposition: Home  Recommendations for Outpatient Follow-up:  1. Follow up with PCP in 1 week 2. Follow up with cardiology 3. Please obtain BMP/CBC in one week 4. Consider SGLT-2 inhibitor for diabetes in setting of cardiac history 5. Please follow up on the following pending results: None  Home Health: None Equipment/Devices: None  Discharge Condition: Stable CODE STATUS: Full code Diet recommendation: Heart healthy/carb modified   Brief/Interim Summary:  Admission HPI written by Kyle Grippe, MD    HPI:   Kyle Barber  is a 58 y.o. male smoker w Dm2, ETOH dep, h/o pancreatitis,  woke up at Midnite and arms and shoulder were aching.  Epigastric /lower chest, burning per patient. Burping some.   + cough, white sputum.  Pt denies fever, chills, palp, sob beyond baseline, n/v, diarrhea, brbpr, black stool. Nitro x2 did not help ease his chest discomfort.  Prior stress test but in the remote past.   Hospital course:  NSTEMI Patient presented with EKG changes significant for ST depression in inferolateral leads with consistently rising troponin from 24 -> 260 -> 6100 within 8 hours. Cardiology consulted and patient with cardiac catheterization with PCI on 11/16 significant for an occluded left circumflex. DES placed. Patient discharged on aspirin, Brillinta, Liptior, Coreg. Patient to follow-up with cardiology as an outpatient.  Essential hypertension Coreg and amlodipine on discharge.  Hyperlipidemia Continue Lipitor.  Diabetes mellitus, type 2 Continue Ozempic. Can consider SGLT2 inhibitor as an outpatient.  Hyperkalemia Treated with with calcium gluconate, sodium bicarbonate, and kayexalate. Resultant hypokalemia which was treated with potassium supplementation.   Tobacco use Counseled on cessation  Alcohol use CIWA while inpatient. Counseled on cessation.  Hepatic steatosis Likely secondary to alcohol use. Outpatient follow-up/management  Discharge Diagnoses:  Principal Problem:   Non-ST elevation (NSTEMI) myocardial infarction [facility]) Active Problems:   Alcoholism (HCC)   Diabetes mellitus type 2 in obese [facility])   Asthma   Chest pain   Cough    Discharge Instructions  Discharge Instructions    Amb Referral to Cardiac Rehabilitation   Complete by: As directed    Diagnosis:  Coronary Stents NSTEMI     After initial evaluation and assessments completed: Virtual Based Care may be provided alone or in conjunction with Phase 2 Cardiac Rehab based on patient barriers.: Yes   Diet - low sodium heart healthy   Complete by: As directed    Increase activity slowly   Complete by: As directed      Allergies as of 05/15/2019      Reactions   Codeine Nausea Only      Medication List    STOP taking these medications   sildenafil 100 MG tablet Commonly known as: VIAGRA     TAKE these medications   albuterol 108 (90 Base) MCG/ACT inhaler Commonly known as: VENTOLIN HFA Inhale 1-2 puffs into the lungs every 6 (six) hours as needed for wheezing or shortness of breath.   amLODipine 5 MG tablet Commonly known as: NORVASC TAKE 1 TABLET BY MOUTH EVERY DAY   aspirin 81 MG chewable tablet Chew 1 tablet (81 mg total) by mouth daily.   atorvastatin 80 MG tablet Commonly known as: LIPITOR Take 1 tablet (80 mg total) by mouth daily at 6 PM.   carvedilol 6.25 MG tablet Commonly known as: COREG Take 1  tablet (6.25 mg total) by mouth 2 (two) times daily with a meal.   glucose blood test strip Test blood sugars 2 times daily.   nitroGLYCERIN 0.4 MG SL tablet Commonly known as: NITROSTAT Place 1 tablet (0.4 mg total) under the tongue every 5 (five) minutes as needed for chest pain.   Ozempic (0.25 or 0.5 MG/DOSE) 2 MG/1.5ML Sopn  Generic drug: Semaglutide(0.25 or 0.5MG /DOS) Inject 0.5 mg as directed once a week.   ticagrelor 90 MG Tabs tablet Commonly known as: BRILINTA Take 1 tablet (90 mg total) by mouth 2 (two) times daily. Prescription for free 30 day supply   ticagrelor 90 MG Tabs tablet Commonly known as: BRILINTA Take 1 tablet (90 mg total) by mouth 2 (two) times daily.      Follow-up Information    Nahser, Deloris Ping, MD Follow up on 05/28/2019.   Specialty: Cardiology Why: Please arrive 15 minutes early for your 1:40pm post-hospital cardiology follow-up appointment Contact information: [address] [place] [address] [phone]        Kyle Dory, DO Follow up.   Specialty: Family Medicine Why: Hospital follow-up Contact information: [address] [place] [address] [phone]          Allergies  Allergen Reactions  . Codeine Nausea Only    Consultations:  Cardiology   Procedures/Studies: Dg Chest 2 View  Result Date: 05/14/2019 CLINICAL DATA:  Chest pain EXAM: CHEST - 2 VIEW COMPARISON:  11/27/2014 FINDINGS: Heart and mediastinal contours are within normal limits. No focal opacities or effusions. No acute bony abnormality. Multiple old right rib fractures again noted, unchanged. IMPRESSION: No active cardiopulmonary disease. Electronically Signed   By: Kyle Barber M.D.   On: 05/14/2019 03:26   US Abdomen Limited Ruq  Result Date: 05/14/2019 CLINICAL DATA:  Abnormal liver function test. EXAM: ULTRASOUND ABDOMEN LIMITED RIGHT UPPER QUADRANT COMPARISON:  August 02, 2011. FINDINGS: Gallbladder: No gallstones or wall thickening visualized. No sonographic Murphy sign noted by sonographer. Common bile duct: Diameter: 2 mm which is within normal limits. Liver: No focal lesion identified. Increased echogenicity of hepatic parenchyma is noted suggesting hepatic steatosis. Portal vein is patent on color Doppler imaging with normal  direction of blood flow towards the liver. Other: None. IMPRESSION: Probable hepatic steatosis. No other abnormality seen in the right upper quadrant of the abdomen. Electronically Signed   By: Kyle Barber M.D.   On: 05/14/2019 07:11     11/16: Transthoracic Echocardiogram  IMPRESSIONS    1. Left ventricular ejection fraction, by visual estimation, is 55 to 60%. The left ventricle has normal function. There is no left ventricular hypertrophy.  2. Basal and mid anterolateral wall and basal and mid inferolateral wall are abnormal.  3. Definity contrast agent was given IV to delineate the left ventricular endocardial borders.  4. The left ventricle demonstrates regional wall motion abnormalities.  5. Normal LV EF with mild hypokinesis of base to mid inferior/inferolateral wall.  6. Global right ventricle has normal systolic function.The right ventricular size is normal. No increase in right ventricular wall thickness.  7. Left atrial size was normal.  8. Right atrial size was normal.  9. Trivial pericardial effusion is present. 10. The mitral valve is normal in structure. No evidence of mitral valve regurgitation. No evidence of mitral stenosis. 11. The tricuspid valve is normal in structure. Tricuspid valve regurgitation is not demonstrated. 12. The aortic valve is tricuspid. Aortic valve regurgitation is  not visualized. No evidence of aortic valve sclerosis or stenosis. 13. The pulmonic valve was grossly normal. Pulmonic valve regurgitation is not visualized. 14. TR signal is inadequate for assessing pulmonary artery systolic pressure. 15. The inferior vena cava is normal in size with greater than 50% respiratory variability, suggesting right atrial pressure of 3 mmHg.  11/16: Left cardiac catheterization Conclusion  1. Acute total occlusion of the left circumflex, treated successfully with stenting (3.0x18 mm Resolute Onyx DES) 2. Moderate mid-LAD stenosis 3. Patent RCA (dominant  vessel)  Recommend: DAPT with ASA and ticagrelor x 12 months. Favor medical therapy for residual CAD unless recurrent angina/ischemic symptoms.     Subjective: No chest pain. Some dyspnea.  Discharge Exam: Vitals:   05/15/19 1052 05/15/19 1102  BP: (!) 144/95   Pulse:  83  Resp:    Temp:    SpO2:     Vitals:   05/15/19 0436 05/15/19 0924 05/15/19 1052 05/15/19 1102  BP: 137/75 (!) 143/92 (!) 144/95   Pulse: 71 82  83  Resp: 19 19    Temp: 98.2 F (36.8 C) 98.4 F (36.9 C)    TempSrc: Oral Oral    SpO2: 99% 97%    Weight:      Height:        General: Pt is alert, awake, not in acute distress Cardiovascular: RRR, S1/S2 +, no rubs, no gallops Respiratory: CTA bilaterally, no wheezing, no rhonchi Abdominal: Soft, NT, ND, bowel sounds + Extremities: no edema, no cyanosis    The results of significant diagnostics from this hospitalization (including imaging, microbiology, ancillary and laboratory) are listed below for reference.     Microbiology: Recent Results (from the past 240 hour(s))  SARS CORONAVIRUS 2 (TAT 6-24 HRS) Nasopharyngeal Nasopharyngeal Swab     Status: None   Collection Time: 05/14/19  4:56 AM   Specimen: Nasopharyngeal Swab  Result Value Ref Range Status   SARS Coronavirus 2 NEGATIVE NEGATIVE Final    Comment: (NOTE) SARS-CoV-2 target nucleic acids are NOT DETECTED. The SARS-CoV-2 RNA is generally detectable in upper and lower respiratory specimens during the acute phase of infection. Negative results do not preclude SARS-CoV-2 infection, do not rule out co-infections with other pathogens, and should not be used as the sole basis for treatment or other patient management decisions. Negative results must be combined with clinical observations, patient history, and epidemiological information. The expected result is Negative. Fact Sheet for Patients: SugarRoll.be Fact Sheet for Healthcare Providers:  https://www.woods-mathews.com/ This test is not yet approved or cleared by the [place] FDA and  has been authorized for detection and/or diagnosis of SARS-CoV-2 by FDA under an Emergency Use Authorization (EUA). This EUA will remain  in effect (meaning this test can be used) for the duration of the COVID-19 declaration under Section 56 4(b)(1) of the Act, 21 U.S.C. section 360bbb-3(b)(1), unless the authorization is terminated or revoked sooner. Performed at [facility], [place] [address] [place], [place] [address]      Labs: BNP (last 3 results) No results for input(s): BNP in the last 8760 hours. Basic Metabolic Panel: Recent Labs  Lab 05/14/19 0332 05/14/19 1149 05/15/19 0317  NA 137 136 138  K 5.6* 4.6 2.9*  CL 100 102 103  CO2 25 22 24   GLUCOSE 277* 214* 164*  BUN 16 12 <5*  CREATININE 1.20 0.61 0.62  CALCIUM 9.0 9.4 8.7*   Liver Function Tests: Recent Labs  Lab 05/14/19 0402 05/15/19 0317  AST 49*  83*  ALT 50* 41  ALKPHOS 53 44  BILITOT 2.0* 1.3*  PROT 7.2 6.1*  ALBUMIN 4.2 3.5   Recent Labs  Lab 05/14/19 0402  LIPASE 48   No results for input(s): AMMONIA in the last 168 hours. CBC: Recent Labs  Lab 05/14/19 0332 05/15/19 0317  WBC 9.1 10.2  HGB 17.5* 15.5  HCT 49.7 42.9  MCV 96.3 94.3  PLT 195 168   Cardiac Enzymes: Recent Labs  Lab 05/14/19 1149  CKTOTAL 624*  CKMB 89.3*   BNP: Invalid input(s): POCBNP CBG: Recent Labs  Lab 05/14/19 2154 05/15/19 0031 05/15/19 0435 05/15/19 0804 05/15/19 1137  GLUCAP 139* 155* 161* 144* 154*   D-Dimer No results for input(s): DDIMER in the last 72 hours. Hgb A1c Recent Labs    05/14/19 0331  HGBA1C 8.0*   Lipid Profile Recent Labs    05/15/19 0317  CHOL 167  HDL 32*  LDLCALC UNABLE TO CALCULATE IF TRIGLYCERIDE OVER 400 mg/dL  TRIG 161414*  CHOLHDL 5.2  LDLDIRECT 77.5   Thyroid function studies No results for input(s): TSH, T4TOTAL, T3FREE, THYROIDAB in the  last 72 hours.  Invalid input(s): FREET3 Anemia work up No results for input(s): VITAMINB12, FOLATE, FERRITIN, TIBC, IRON, RETICCTPCT in the last 72 hours. Urinalysis    Component Value Date/Time   COLORURINE YELLOW 08/01/2011 1554   APPEARANCEUR CLEAR 08/01/2011 1554   LABSPEC 1.040 (H) 08/01/2011 1554   PHURINE 5.5 08/01/2011 1554   GLUCOSEU >1000 (A) 08/01/2011 1554   HGBUR NEGATIVE 08/01/2011 1554   BILIRUBINUR LARGE (A) 08/01/2011 1554   KETONESUR >80 (A) 08/01/2011 1554   PROTEINUR 30 (A) 08/01/2011 1554   UROBILINOGEN 0.2 08/01/2011 1554   NITRITE NEGATIVE 08/01/2011 1554   LEUKOCYTESUR NEGATIVE 08/01/2011 1554   Sepsis Labs Invalid input(s): PROCALCITONIN,  WBC,  LACTICIDVEN Microbiology Recent Results (from the past 240 hour(s))  SARS CORONAVIRUS 2 (TAT 6-24 HRS) Nasopharyngeal Nasopharyngeal Swab     Status: None   Collection Time: 05/14/19  4:56 AM   Specimen: Nasopharyngeal Swab  Result Value Ref Range Status   SARS Coronavirus 2 NEGATIVE NEGATIVE Final    Comment: (NOTE) SARS-CoV-2 target nucleic acids are NOT DETECTED. The SARS-CoV-2 RNA is generally detectable in upper and lower respiratory specimens during the acute phase of infection. Negative results do not preclude SARS-CoV-2 infection, do not rule out co-infections with other pathogens, and should not be used as the sole basis for treatment or other patient management decisions. Negative results must be combined with clinical observations, patient history, and epidemiological information. The expected result is Negative. Fact Sheet for Patients: HairSlick.nohttps://www.fda.gov/media/138098/download Fact Sheet for Healthcare Providers: quierodirigir.comhttps://www.fda.gov/media/138095/download This test is not yet approved or cleared by the [place] FDA and  has been authorized for detection and/or diagnosis of SARS-CoV-2 by FDA under an Emergency Use Authorization (EUA). This EUA will remain  in effect (meaning this test  can be used) for the duration of the COVID-19 declaration under Section 56 4(b)(1) of the Act, 21 U.S.C. section 360bbb-3(b)(1), unless the authorization is terminated or revoked sooner. Performed at [facility], [address] [place], [place] [id number]      Time coordinating discharge: 35 minutes  SIGNED:   Jacquelin Hawkingalph Luna Audia, MD [facility] 05/15/2019, 12:27 PM

## 2019-05-15 NOTE — Progress Notes (Signed)
CARDIAC REHAB PHASE I   PRE:  Rate/Rhythm: 81 SR  BP:  Sitting: 124/95      SaO2: 97 RA  MODE:  Ambulation: 500 ft   POST:  Rate/Rhythm: 93 SR  BP:  Sitting: 144/83    SaO2: 98 RA  Pt ambulated 575ft in hallway independently with steady gait. Pt denies CP but does c/o some SOB. Pt educated on importance of ASA, Brilinta, statin, and NTG. Pt given MI book, stent card, heart healthy and diabetic diet. Reviewed restrictions, site care, and exercise guidelines. Will refer to CRP II [place]. Pt is interested in participating in Virtual Cardiac and Pulmonary Rehab. Pt advised that Virtual Cardiac and Pulmonary Rehab is provided at no cost to the patient.  Checklist:  1. Pt has smart device  ie smartphone and/or ipad for downloading an app  Yes 2. Reliable internet/wifi service    Yes 3. Understands how to use their smartphone and navigate within an app.  Yes  Pt verbalized understanding and is in agreement.  [id number] Rufina Falco, RN BSN 05/15/2019 9:25 AM

## 2019-05-15 NOTE — Progress Notes (Signed)
Removed TR band from right radial. Site is a level 0. Applied gauze and Tegaderm to site and educated to leave in place for 24 hrs. Pt verbalized understanding.

## 2019-05-15 NOTE — Telephone Encounter (Signed)
**Note De-Identified [name] Obfuscation** The pt is currently in the hosp. We will monitor his chart and call once he has been discharged.

## 2019-05-15 NOTE — Progress Notes (Addendum)
Progress Note  Patient Name: Kyle Barber Date of Encounter: 05/15/2019  Primary Cardiologist: Kristeen MissPhilip Louana Fontenot, MD   Subjective   Doing well this morning. Some SOB with brilinta. We discussed caffeine use to help with this. No recurrent chest pain. He is hopeful to go home today.   Inpatient Medications    Scheduled Meds: . amLODipine  5 mg Oral Daily  . aspirin  81 mg Oral Daily  . atorvastatin  80 mg Oral q1800  . carvedilol  6.25 mg Oral BID WC  . folic acid  1 mg Oral Daily  . insulin aspart  0-9 Units Subcutaneous Q4H  . LORazepam  0-4 mg Intravenous Q4H   Followed by  . [START ON 05/16/2019] LORazepam  0-4 mg Intravenous Q8H  . multivitamin with minerals  1 tablet Oral Daily  . pantoprazole  40 mg Oral Daily  . potassium chloride  60 mEq Oral Q3H  . sodium chloride flush  3 mL Intravenous Q12H  . sodium chloride flush  3 mL Intravenous Q12H  . thiamine  100 mg Oral Daily   Or  . thiamine  100 mg Intravenous Daily  . ticagrelor  90 mg Oral BID   Continuous Infusions: . sodium chloride     PRN Meds: sodium chloride, acetaminophen **OR** acetaminophen, hydrALAZINE, LORazepam **OR** LORazepam, nitroGLYCERIN, ondansetron (ZOFRAN) IV, sodium chloride flush   Vital Signs    Vitals:   05/15/19 0025 05/15/19 0100 05/15/19 0436 05/15/19 0924  BP: 137/77 (!) 143/87 137/75 (!) 143/92  Pulse: 75 77 71 82  Resp: (!) 21  19 19   Temp: 98.4 F (36.9 C)  98.2 F (36.8 C) 98.4 F (36.9 C)  TempSrc: Oral  Oral Oral  SpO2: 98%  99% 97%  Weight:      Height:        Intake/Output Summary (Last 24 hours) at 05/15/2019 0953 Last data filed at 05/15/2019 [id number] Gross per 24 hour  Intake 1317.99 ml  Output 1800 ml  Net -482.01 ml   Filed Weights   05/14/19 0252 05/14/19 1736  Weight: 91.2 kg 91.3 kg    Telemetry    Sinus rhythm with 1st degree AV block, occasional PVCs - Personally Reviewed  ECG    No new tracings - Personally Reviewed  Physical Exam   GEN:  Laying in bed in no acute distress.   Neck: No JVD, no carotid bruits Cardiac: RRR, no murmurs, rubs, or gallops. Radial cath site C/D/I, no hematoma or erythmea  Respiratory: Clear to auscultation bilaterally, no wheezes/ rales/ rhonchi GI: NABS, Soft, nontender, non-distended  MS: No edema; No deformity. Neuro:  Nonfocal, moving all extremities spontaneously Psych: Normal affect   Labs    Chemistry Recent Labs  Lab 05/14/19 0332 05/14/19 0402 05/14/19 1149 05/15/19 0317  NA 137  --  136 138  K 5.6*  --  4.6 2.9*  CL 100  --  102 103  CO2 25  --  22 24  GLUCOSE 277*  --  214* 164*  BUN 16  --  12 <5*  CREATININE 1.20  --  0.61 0.62  CALCIUM 9.0  --  9.4 8.7*  PROT  --  7.2  --  6.1*  ALBUMIN  --  4.2  --  3.5  AST  --  49*  --  83*  ALT  --  50*  --  41  ALKPHOS  --  53  --  44  BILITOT  --  2.0*  --  1.3*  GFRNONAA >60  --  >60 >60  GFRAA >60  --  >60 >60  ANIONGAP 12  --  12 11     Hematology Recent Labs  Lab 05/14/19 0332 05/15/19 0317  WBC 9.1 10.2  RBC 5.16 4.55  HGB 17.5* 15.5  HCT 49.7 42.9  MCV 96.3 94.3  MCH 33.9 34.1*  MCHC 35.2 36.1*  RDW 11.4* 11.3*  PLT 195 168    Cardiac EnzymesNo results for input(s): TROPONINI in the last 168 hours. No results for input(s): TROPIPOC in the last 168 hours.   BNPNo results for input(s): BNP, PROBNP in the last 168 hours.   DDimer No results for input(s): DDIMER in the last 168 hours.   Radiology    Dg Chest 2 View  Result Date: 05/14/2019 CLINICAL DATA:  Chest pain EXAM: CHEST - 2 VIEW COMPARISON:  11/27/2014 FINDINGS: Heart and mediastinal contours are within normal limits. No focal opacities or effusions. No acute bony abnormality. Multiple old right rib fractures again noted, unchanged. IMPRESSION: No active cardiopulmonary disease. Electronically Signed   By: Rolm Baptise M.D.   On: 05/14/2019 03:26   US Abdomen Limited Ruq  Result Date: 05/14/2019 CLINICAL DATA:  Abnormal liver function test.  EXAM: ULTRASOUND ABDOMEN LIMITED RIGHT UPPER QUADRANT COMPARISON:  August 02, 2011. FINDINGS: Gallbladder: No gallstones or wall thickening visualized. No sonographic Murphy sign noted by sonographer. Common bile duct: Diameter: 2 mm which is within normal limits. Liver: No focal lesion identified. Increased echogenicity of hepatic parenchyma is noted suggesting hepatic steatosis. Portal vein is patent on color Doppler imaging with normal direction of blood flow towards the liver. Other: None. IMPRESSION: Probable hepatic steatosis. No other abnormality seen in the right upper quadrant of the abdomen. Electronically Signed   By: Marijo Conception M.D.   On: 05/14/2019 07:11    Cardiac Studies   Left heart catheterization 05/14/2019: 1. Acute total occlusion of the left circumflex, treated successfully with stenting (3.0x18 mm Resolute Onyx DES) 2. Moderate mid-LAD stenosis 3. Patent RCA (dominant vessel)  Recommend: DAPT with ASA and ticagrelor x 12 months. Favor medical therapy for residual CAD unless recurrent angina/ischemic symptoms.   Echocardiogram 05/14/2019: 1. Left ventricular ejection fraction, by visual estimation, is 55 to 60%. The left ventricle has normal function. There is no left ventricular hypertrophy.  2. Basal and mid anterolateral wall and basal and mid inferolateral wall are abnormal.  3. Definity contrast agent was given IV to delineate the left ventricular endocardial borders.  4. The left ventricle demonstrates regional wall motion abnormalities.  5. Normal LV EF with mild hypokinesis of base to mid inferior/inferolateral wall.  6. Global right ventricle has normal systolic function.The right ventricular size is normal. No increase in right ventricular wall thickness.  7. Left atrial size was normal.  8. Right atrial size was normal.  9. Trivial pericardial effusion is present. 10. The mitral valve is normal in structure. No evidence of mitral valve regurgitation. No  evidence of mitral stenosis. 11. The tricuspid valve is normal in structure. Tricuspid valve regurgitation is not demonstrated. 12. The aortic valve is tricuspid. Aortic valve regurgitation is not visualized. No evidence of aortic valve sclerosis or stenosis. 13. The pulmonic valve was grossly normal. Pulmonic valve regurgitation is not visualized. 14. TR signal is inadequate for assessing pulmonary artery systolic pressure. 15. The inferior vena cava is normal in size with greater than 50% respiratory variability, suggesting right atrial pressure of 3 mmHg.  Patient  Profile     58 y.o. male with a hx of DM type 2, smoker, hypertension, asthma and daily alcohol use who is being followed by cardiology for the evaluation of chest pain and Non-STEMI   Assessment & Plan    1. NSTEMI: patient presented with chest pain. Found to have elevated troponins - peaked at 6112. EKG had slight ST depressions inferolaterally. He underwent LHC yesterday which showed occlusion of LCx successfully treated with PCI/DES; also with 50% LAD stenosis and minimal RCA disease.  Echo with EF 55-60% - Continue aspirin and brilinta - Continue high-intensity statin - Continue carvedilol  2. HTN: BP persistently above goal despite continue home amlodipine and starting carvedilol 3.125mg  BID - Will increase carvedilol to 6.25mg  BID - Continue amlodipine  3. HLD: unable to calculate LDL this admission. Started on high-intensity statin - Continue statin - Plan to recheck FLP and LFTs in 6-8 weeks   4. DM type 2: A1C 7.0 - Consider SGLT2-I given CAD findings this admission - Will defer management to primary team  5. Hyperkalemia: K reversed with calcium gluconate, sodium bicarb, and kayexalate. Now with hypokalemia with K 2.9 this morning. - Will replete with po potassium this morning.  - Recommend BMET at follow-up for close monitoring  6. Tobacco abuse: occasional tobacco use in social situations - Encouraged  smoking cessation  7. Etoh abuse: LFTs mildly elevated. CIWA protocol in place - Continue to encourage cessation   For questions or updates, please contact [facility] Please consult [email or web address] for contact info under Cardiology/STEMI.      Signed, Beatriz Stallion, PA-C  05/15/2019, 9:53 AM   [phone]   Attending Note:   The patient was seen and examined.  Agree with assessment and plan as noted above.  Changes made to the above note as needed.  Patient seen and independently examined with Peggye Ley, PA .   We discussed all aspects of the encounter. I agree with the assessment and plan as stated above.  1.  CAD :   Admitted with NSTEMI.   Had urgent cath and was found to have an occluded LCx.  Pain is better following PCI  Rec.  Smoking cessation  Lipid management   2.  Hyperlipidemia:   Trig level is 414.  Unable to calculate his LDL .  Cont atorvastatin . Will likely need another agent to lower his Triglyceride level  3.  HTN:   We increased coreg.   Cont other meds.   Needs to restrict his salt     I have spent a total of 40 minutes with patient reviewing hospital  notes , telemetry, EKGs, labs and examining patient as well as establishing an assessment and plan that was discussed with the patient. > 50% of time was spent in direct patient care.    Vesta Mixer, Montez Hageman., MD, [facility] 05/15/2019, 10:16 AM [address],  [address] Office [phone] Pager [phone]

## 2019-05-15 NOTE — Telephone Encounter (Signed)
New Message  Per Roby Lofts scheduled [facility] visit with Dr. Acie Fredrickson on 05/28/19 at 1:40 pm.

## 2019-05-16 ENCOUNTER — Telehealth (HOSPITAL_COMMUNITY): Payer: Self-pay

## 2019-05-16 ENCOUNTER — Telehealth: Payer: Self-pay | Admitting: *Deleted

## 2019-05-16 NOTE — Telephone Encounter (Signed)
Called patient to see if he is interested in the Cardiac/Virtual Rehab Program. Patient expressed interest. Explained scheduling process and went over insurance, patient verbalized understanding. Will contact patient for scheduling once f/u has been completed. 

## 2019-05-16 NOTE — Telephone Encounter (Signed)
TCM Call 1st Attempt: No ans on the pts home phone and message on the VM states that the mailbox is full. I then called the pts cell phone and got no answer but was able to leave a message asking him to call me back.

## 2019-05-16 NOTE — Telephone Encounter (Signed)
Pt insurance is active and benefits verified through [facility]. Co-pay $0.00, DED $1,000.00/$7.32 met, out of pocket $5,000.00/$298.78 met, co-insurance 20%. No pre-authorization required. Passport, 05/16/2019 @ 3:25PM, WBD#[id number]  Will contact patient to see if he is interested in the Cardiac Rehab Program. If interested, patient will need to complete follow up appt. Once completed, patient will be contacted for scheduling upon review by the RN Navigator.

## 2019-05-16 NOTE — Telephone Encounter (Signed)
Unable to reach pt. Pt does have hospital follow up scheduled 05/18/19.

## 2019-05-16 NOTE — Telephone Encounter (Signed)
Patient contacted regarding discharge from [facility] on 05/15/2019  Patient understands to follow up with provider Dr Acie Fredrickson on 05/28/2019 at 1:40 at [facility] in [place], [place] [address]. Patient understands discharge instructions? Yes Patient understands medications and regiment? yes Patient understands to bring all medications to this visit? Yes

## 2019-05-17 LAB — HEPATITIS PANEL, ACUTE
HCV Ab: <0.1 s/co ratio — AB (ref 0.0–0.9)
Hep A IgM: NEGATIVE — AB
Hep B C IgM: NEGATIVE — AB
Hepatitis B Surface Ag: NEGATIVE — AB

## 2019-05-17 NOTE — Telephone Encounter (Signed)
Unable to reach pt. 2nd attempt. 

## 2019-05-18 ENCOUNTER — Other Ambulatory Visit: Payer: Self-pay

## 2019-05-18 ENCOUNTER — Ambulatory Visit (INDEPENDENT_AMBULATORY_CARE_PROVIDER_SITE_OTHER): Payer: 59 | Admitting: Family Medicine

## 2019-05-18 ENCOUNTER — Encounter: Payer: Self-pay | Admitting: Family Medicine

## 2019-05-18 VITALS — BP 125/90

## 2019-05-18 DIAGNOSIS — I10 Essential (primary) hypertension: Secondary | ICD-10-CM | POA: Diagnosis not present

## 2019-05-18 DIAGNOSIS — I214 Non-ST elevation (NSTEMI) myocardial infarction: Secondary | ICD-10-CM | POA: Diagnosis not present

## 2019-05-18 DIAGNOSIS — E1169 Type 2 diabetes mellitus with other specified complication: Secondary | ICD-10-CM

## 2019-05-18 DIAGNOSIS — E669 Obesity, unspecified: Secondary | ICD-10-CM

## 2019-05-18 MED ORDER — OZEMPIC (0.25 OR 0.5 MG/DOSE) 2 MG/1.5ML ~~LOC~~ SOPN: 0.5000 mg | PEN_INJECTOR | SUBCUTANEOUS | 1 refills | Status: DC

## 2019-05-18 MED ORDER — LOSARTAN POTASSIUM 50 MG PO TABS: 50.0000 mg | ORAL_TABLET | Freq: Every day | ORAL | 3 refills | Status: DC

## 2019-05-18 NOTE — Progress Notes (Addendum)
Chief Complaint  Patient presents with  . Hospitalization Follow-up    Heart Attack    HPI Kyle Barber is a 58 y.o. y.o. male who presents for a transition of care visit.  Pt was discharged from [facility] on 05/15/2019.  Within 48 business hours of discharge our office attempted to contact pt 2x to coordinate care and needs.   Due to COVID-19 pandemic, we are interacting via web portal for an electronic face-to-face visit. I verified patient's ID using 2 identifiers. Patient agreed to proceed with visit via this method. Patient is at home, I am at office. Patient and I are present for visit.   On 5 days ago, the patient woke up out of sleep with burning pain in both of his arms.  He took out reflux medication but it did not help.  He went to the emergency department and was found to have a heart attack.  He was transferred to [facility] and had his circumflex artery stented.  He is currently on Brilinta and atorvastatin was started.  He is also compliant with his Ozempic.  A1c was 7 hospital.  His diet has gotten better.  He is still getting some shortness of breath.  Blood pressure is currently well controlled.  He is compliant with Coreg and amlodipine.  He was on lisinopril in the past.  This made him very very tired and it was stopped.  No chest pain.  Past Medical History:  Diagnosis Date  . Asthma 02/27/2016  . Diabetes mellitus without complication ([place])   . Hypertension   . Pancreatitis   . Premature ventricular contraction    Past Surgical History:  Procedure Laterality Date  . APPENDECTOMY    . CORONARY STENT INTERVENTION N/A 05/14/2019   Procedure: CORONARY STENT INTERVENTION;  Surgeon: Sherren Mocha, MD;  Location: [facility];  Service: Cardiovascular;  Laterality: N/A;  . HERNIA REPAIR    . LEFT HEART CATH AND CORONARY ANGIOGRAPHY N/A 05/14/2019   Procedure: LEFT HEART CATH AND CORONARY ANGIOGRAPHY;  Surgeon: Sherren Mocha, MD;  Location: [place] [facility];   Service: Cardiovascular;  Laterality: N/A;    Family History  Problem Relation Age of Onset  . Emphysema Mother   . Diabetes Father   . CAD Father        Died of MI at age 8  . Diabetes Paternal Grandmother   . CAD Paternal Grandfather        died at age 17   Allergies as of 05/18/2019      Reactions   Codeine Nausea Only      Medication List       Accurate as of May 18, 2019 11:50 AM. If you have any questions, ask your nurse or doctor.        STOP taking these medications   amLODipine 5 MG tablet Commonly known as: NORVASC Stopped by: Shelda Pal, DO     TAKE these medications   albuterol 108 (90 Base) MCG/ACT inhaler Commonly known as: VENTOLIN HFA Inhale 1-2 puffs into the lungs every 6 (six) hours as needed for wheezing or shortness of breath.   aspirin 81 MG chewable tablet Chew 1 tablet (81 mg total) by mouth daily.   atorvastatin 80 MG tablet Commonly known as: LIPITOR Take 1 tablet (80 mg total) by mouth daily at 6 PM.   carvedilol 6.25 MG tablet Commonly known as: COREG Take 1 tablet (6.25 mg total) by mouth 2 (two) times  daily with a meal.   glucose blood test strip Test blood sugars 2 times daily.   losartan 50 MG tablet Commonly known as: COZAAR Take 1 tablet (50 mg total) by mouth daily. Started by: Sharlene Dory, DO   nitroGLYCERIN 0.4 MG SL tablet Commonly known as: NITROSTAT Place 1 tablet (0.4 mg total) under the tongue every 5 (five) minutes as needed for chest pain.   Ozempic (0.25 or 0.5 MG/DOSE) 2 MG/1.5ML Sopn Generic drug: Semaglutide(0.25 or 0.5MG /DOS) Inject 0.5 mg as directed once a week.   ticagrelor 90 MG Tabs tablet Commonly known as: BRILINTA Take 1 tablet (90 mg total) by mouth 2 (two) times daily. What changed: Another medication with the same name was removed. Continue taking this medication, and follow the directions you see here. Changed by: Sharlene Dory, DO       ROS:   Constitutional: No fevers or chills, no weight loss HEENT: No headaches, hearing loss, or runny nose, no sore throat Heart: No chest pain Lungs: no cough Abd: No bowel changes, no pain, no N/V GU: No urinary complaints Neuro: No numbness, tingling or weakness Msk: No joint or muscle pain  Objective No conversational dyspnea Age appropriate judgment and insight Nml affect and mood  Non-ST elevation myocardial infarction (NSTEMI) (HCC)  Diabetes mellitus type 2 in obese (HCC) - Plan: Semaglutide,0.25 or 0.5MG /DOS, (OZEMPIC, 0.25 OR 0.5 MG/DOSE,) 2 MG/1.5ML SOPN  Essential hypertension - Plan: losartan (COZAAR) 50 MG tablet  Discharge summary and medication list have been reviewed/reconciled.  Labs pending at the time of discharge have been reviewed or are still pending at the time of this visit.  Follow-up labs and appointments have been ordered and/or coordinated appropriately. Start losartan instead of Norvasc. He will see if ins covers [place] or [place], which he has written down. Cont statin.  Appt w cards on 11/30.   TRANSITIONAL CARE MANAGEMENT CERTIFICATION:  I certify the following are true:   1. Communication with the patient/care giver was made within 2 business days of discharge.  2. Complexity of Medical decision making is moderate.  3. Face to face visit occurred within 14 days of discharge.   F/u as originally scheduled. The patient voiced understanding and agreement to the plan.  Jilda Roche Bug Tussle, DO 05/18/19 11:50 AM

## 2019-05-22 ENCOUNTER — Telehealth: Payer: Self-pay | Admitting: Cardiovascular Disease

## 2019-05-22 NOTE — Telephone Encounter (Signed)
Disability paperwork received from [employer]. Placed in Dr. Elmarie Shiley box for review. 05/22/19 vlm

## 2019-05-27 NOTE — Progress Notes (Signed)
Cardiology Office Note:    Date:  05/28/2019   ID:  Kyle CapuchinSteven R Kapler, DOB 03/07/1961, MRN [id number]  PCP:  Sharlene DoryWendling, Nicholas Paul, DO  Cardiologist:  Kristeen MissPhilip Nahser, MD  Electrophysiologist:  None   Referring MD: Sharlene DoryWendling, Nicholas Paul*   Chief Complaint  Patient presents with  . Coronary Artery Disease    History of Present Illness:    Kyle Barber is a 58 y.o. male with a hx of DM2,  cigarette smoking, hypertension who was admitted to the hospital on May 14, 2019  with non-ST segment elevation myocardial infarction.  The patient's symptoms were abdominal pain with radiation to the shoulder and down his left arm.  The symptoms resolved with sublingual nitroglycerin.  He had elevated troponin levels and was transferred [facility] the [facility] where he had an urgent heart catheterization.  At heart catheterization he was found to have a total occlusion of his left circumflex artery which was successfully stented with a 3.0 x 18 mm resolute Onyx DES.  He has moderate mid LAD stenosis.  Recovering well.  No further CP .   Has some fatigue .   Has rare sharp pains. 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 Asthma 02/27/2016  . Diabetes mellitus without complication (HCC)   . Hypertension   . Pancreatitis   . Premature ventricular contraction     Past Surgical History:  Procedure Laterality Date  . APPENDECTOMY    . CORONARY STENT INTERVENTION N/A 05/14/2019   Procedure: CORONARY STENT INTERVENTION;  Surgeon: Tonny Bollmanooper, Michael, MD;  [facility];  Service: Cardiovascular;  Laterality: N/A;  . HERNIA REPAIR    . LEFT HEART CATH AND CORONARY ANGIOGRAPHY N/A 05/14/2019   Procedure: LEFT HEART CATH AND CORONARY ANGIOGRAPHY;  Surgeon: Tonny Bollmanooper, Michael, MD;  [facility];  Service: Cardiovascular;  Laterality: N/A;     Current Medications: Current Meds  Medication Sig  . albuterol (VENTOLIN HFA) 108 (90 Base) MCG/ACT inhaler Inhale 1-2 puffs into the lungs every 6 (six) hours as needed for wheezing or shortness of breath.  [name]. aspirin 81 MG chewable tablet Chew 1 tablet (81 mg total) by mouth daily.  [name]. atorvastatin (LIPITOR) 80 MG tablet Take 1 tablet (80 mg total) by mouth daily at 6 PM.  . carvedilol (COREG) 6.25 MG tablet Take 1 tablet (6.25 mg total) by mouth 2 (two) times daily with a meal.  . glucose blood test strip Test blood sugars 2 times daily.  . nitroGLYCERIN (NITROSTAT) 0.4 MG SL tablet Place 1 tablet (0.4 mg total) under the tongue every 5 (five) minutes as needed for chest pain.  . Semaglutide,0.25 or 0.5MG /DOS, (OZEMPIC, 0.25 OR 0.5 MG/DOSE,) 2 MG/1.5ML SOPN Inject 0.5 mg as directed once a week.  . ticagrelor (BRILINTA) 90 MG TABS tablet Take 1 tablet (90 mg total) by mouth 2 (two) times daily.  . [DISCONTINUED] amLODipine (NORVASC) 5 MG tablet Take 5 mg by mouth daily.     Allergies:   Codeine   Social History   Socioeconomic History  . Marital status: Divorced    Spouse name: Not on file  . Number of children: Not on file  . Years of education: Not on file  . Highest education level: Not on file  Occupational History  . Not on file  Social  Needs  . Financial resource strain: Not on file  . Food insecurity    Worry: Not on file    Inability: Not on file  . Transportation needs    Medical: Not on file    Non-medical: Not on file  Tobacco Use  . Smoking status: Former Smoker    Packs/day: 0.25    Years: 10.00    Pack years: 2.50    Types: Cigarettes    Quit date: 07/01/2011    Years since quitting: 7.9  . Smokeless tobacco: Former Neurosurgeon    Quit date: 06/29/2011  Substance and Sexual Activity  . Alcohol use: Yes    Comment: weekly  . Drug use: No  . Sexual activity: Not on file  Lifestyle  . Physical activity    Days per week: Not on file    Minutes per session: Not on  file  . Stress: Not on file  Relationships  . Social Musician on phone: Not on file    Gets together: Not on file    Attends religious service: Not on file    Active member of club or organization: Not on file    Attends meetings of clubs or organizations: Not on file    Relationship status: Not on file  Other Topics Concern  . Not on file  Social History Narrative  . Not on file     Family History: The patient's family history includes CAD in his father and paternal grandfather; Diabetes in his father and paternal grandmother; Emphysema in his mother.  ROS:   Please see the history of present illness.     All other systems reviewed and are negative.  EKGs/Labs/Other Studies Reviewed:    The following studies were reviewed today:   EKG:    Recent Labs: 05/15/2019: ALT 41; BUN <5; Creatinine, Ser 0.62; Hemoglobin 15.5; Platelets 168; Potassium 2.9; Sodium 138  Recent Lipid Panel    Component Value Date/Time   CHOL 167 05/15/2019 0317   TRIG 414 (H) 05/15/2019 0317   HDL 32 (L) 05/15/2019 0317   CHOLHDL 5.2 05/15/2019 0317   VLDL UNABLE TO CALCULATE IF TRIGLYCERIDE OVER 400 mg/dL 40/01/6760 9509   LDLCALC UNABLE TO CALCULATE IF TRIGLYCERIDE OVER 400 mg/dL 32/67/1245 8099   LDLDIRECT 77.5 05/15/2019 0317    Physical Exam:    VS:  BP 120/78   Pulse 79   Ht 5\' 8"  (1.727 m)   Wt 197 lb 12.8 oz (89.7 kg)   SpO2 98%   BMI 30.08 kg/m     Wt Readings from Last 3 Encounters:  05/28/19 197 lb 12.8 oz (89.7 kg)  05/14/19 201 lb 4.8 oz (91.3 kg)  01/22/19 201 lb 4 oz (91.3 kg)     GEN:  Well nourished, well developed in no acute distress HEENT: Normal NECK: No JVD; No carotid bruits LYMPHATICS: No lymphadenopathy CARDIAC: RRR, no murmurs, rubs, gallops RESPIRATORY:  Clear to auscultation without rales, wheezing or rhonchi  ABDOMEN: Soft, non-tender, non-distended MUSCULOSKELETAL:  No edema; No deformity  SKIN: Warm and dry NEUROLOGIC:  Alert and  oriented x 3 PSYCHIATRIC:  Normal affect   ASSESSMENT:    1. Non-ST elevation (NSTEMI) myocardial infarction (HCC)   2. Elevated triglycerides with high cholesterol   3. Essential hypertension   4. History of non-ST elevation myocardial infarction (NSTEMI)    PLAN:    In order of problems listed above:  1. Coronary artery disease: 01/24/19 presents today following his non-ST  segment elevation myocardial infarction.  He had occlusion of his left circumflex artery.  This was successfully stented.  He is doing well and has not had any further episodes of angina.  He continues to have some generalized weakness but is really gradually regaining his stamina.  He has stopped smoking completely   We will have him return to work on December 7.  2.  Hypertension: Patient is on amlodipine.  He has diabetes.  We will discontinue the amlodipine and start him on losartan 50 mg a day.  Continue carvedilol 6.25 mg twice a day.  3.  Hyperlipidemia: He is currently on atorvastatin.  We will plan on checking liver enzymes and lipid profile when I see him again in 3 months.   Medication Adjustments/Labs and Tests Ordered: Current medicines are reviewed at length with the patient today.  Concerns regarding medicines are outlined above.  Orders Placed This Encounter  Procedures  . Basic Metabolic Panel (BMET)   Meds ordered this encounter  Medications  . losartan (COZAAR) 50 MG tablet    Sig: Take 1 tablet (50 mg total) by mouth daily.    Dispense:  90 tablet    Refill:  3    Patient Instructions  Medication Instructions:  Your physician has recommended you make the following change in your medication:  STOP Amlodipine START Losartan 50 mg once daily  *If you need a refill on your cardiac medications before your next appointment, please call your pharmacy*  Lab Work: Your physician recommends that you return for lab work in: 3 weeks for basic metabolic panel on Tues. Dec. 22. You may come in  anytime after 7:30 am and before 4:45 pm. You do not have to fast for this appointment.  Your physician recommends that you return for lab work in: 3 months on the day of your appointment with Dr. Acie Fredrickson, March 2 You will need to FAST for this appointment - nothing to eat or drink after midnight the night before except water.   Testing/Procedures: None Ordered   Follow-Up: At [facility], you and your health needs are our priority.  As part of our continuing mission to provide you with exceptional heart care, we have created designated Provider Care Teams.  These Care Teams include your primary Cardiologist (physician) and Advanced Practice Providers (APPs -  Physician Assistants and Nurse Practitioners) who all work together to provide you with the care you need, when you need it.  Your next appointment:   3 month(s) on Tuesday March 2 at 8:00 am  The format for your next appointment:   In Person  Provider:   You may see Mertie Moores, MD or one of the following Advanced Practice Providers on your designated Care Team:    Richardson Dopp, PA-C  [place], [place]  Daune Perch, [place]       Signed, Mertie Moores, MD  05/28/2019 5:55 PM    [place]

## 2019-05-28 ENCOUNTER — Encounter: Payer: Self-pay | Admitting: Cardiovascular Disease

## 2019-05-28 ENCOUNTER — Ambulatory Visit: Payer: 59 | Admitting: Cardiovascular Disease

## 2019-05-28 ENCOUNTER — Other Ambulatory Visit: Payer: Self-pay

## 2019-05-28 VITALS — BP 120/78 | HR 79 | Ht 68.0 in | Wt 197.8 lb

## 2019-05-28 DIAGNOSIS — I10 Essential (primary) hypertension: Secondary | ICD-10-CM | POA: Diagnosis not present

## 2019-05-28 DIAGNOSIS — I252 Old myocardial infarction: Secondary | ICD-10-CM

## 2019-05-28 DIAGNOSIS — I214 Non-ST elevation (NSTEMI) myocardial infarction: Secondary | ICD-10-CM | POA: Diagnosis not present

## 2019-05-28 DIAGNOSIS — E782 Mixed hyperlipidemia: Secondary | ICD-10-CM

## 2019-05-28 MED ORDER — LOSARTAN POTASSIUM 50 MG PO TABS: 50.0000 mg | ORAL_TABLET | Freq: Every day | ORAL | 3 refills | Status: DC

## 2019-05-28 NOTE — Patient Instructions (Addendum)
Medication Instructions:  Your physician has recommended you make the following change in your medication:  STOP Amlodipine START Losartan 50 mg once daily  *If you need a refill on your cardiac medications before your next appointment, please call your pharmacy*  Lab Work: Your physician recommends that you return for lab work in: 3 weeks for basic metabolic panel on Tues. Dec. 22. You may come in anytime after 7:30 am and before 4:45 pm. You do not have to fast for this appointment.  Your physician recommends that you return for lab work in: 3 months on the day of your appointment with Dr. Acie Fredrickson, March 2 You will need to FAST for this appointment - nothing to eat or drink after midnight the night before except water.   Testing/Procedures: None Ordered   Follow-Up: At [facility], you and your health needs are our priority.  As part of our continuing mission to provide you with exceptional heart care, we have created designated Provider Care Teams.  These Care Teams include your primary Cardiologist (physician) and Advanced Practice Providers (APPs -  Physician Assistants and Nurse Practitioners) who all work together to provide you with the care you need, when you need it.  Your next appointment:   3 month(s) on Tuesday March 2 at 8:00 am  The format for your next appointment:   In Person  Provider:   You may see Mertie Moores, MD or one of the following Advanced Practice Providers on your designated Care Team:    Richardson Dopp, PA-C  [place], [place]  Daune Perch, [place]

## 2019-05-29 ENCOUNTER — Encounter (HOSPITAL_COMMUNITY): Payer: Self-pay

## 2019-05-29 ENCOUNTER — Telehealth (HOSPITAL_COMMUNITY): Payer: Self-pay

## 2019-05-29 NOTE — Telephone Encounter (Signed)
-----   Message from Thayer Headings, MD sent at 05/29/2019 12:53 PM EST ----- Regarding: RE: Covid risk score Yes, I think in person rehab will be fine Thanks  Kyle Barber  ----- Message ----- From: Benedetto Goad, RN Sent: 05/29/2019  12:49 PM EST To: Rowe Pavy, RN, Thayer Headings, MD Subject: Covid risk score                               Hi Dr. Acie Fredrickson,  The above patient has a Covid Risk Score of 7 which is "high". Are you OK with patient participating in in-person Cardiac Rehab with Covid precautions and social distancing in place? Please advise.  Thank you, Haakon Titsworth E. Rollene Rotunda, RN

## 2019-05-29 NOTE — Progress Notes (Signed)
Clinical review of pt follow up appt on 05/28/19 with Dr. Acie Fredrickson - cardiologist office note. Pt is making the expected progress in recovery.  Pt appropriate for scheduling for on site cardiac rehab and/or enrollment in Virtual Cardiac Rehab.  Pt Covid score is 7.  Will forward to staff for follow up. Jaylyn Booher E. Laray Anger, BSN

## 2019-05-31 ENCOUNTER — Telehealth (HOSPITAL_COMMUNITY): Payer: Self-pay

## 2019-05-31 NOTE — Telephone Encounter (Signed)
Called pt to see if he was interested in scheduling for cardiac rehab, pt was interested but worried about the cost advised pt that if he is going to keep his same insurance which he stated he was I advised pt that he would have a 20% co insurance pt stated that he would have to talk with his insurance and then call back. Placed ppw in the ready to sch bin on the wall.

## 2019-06-11 ENCOUNTER — Other Ambulatory Visit: Payer: Self-pay | Admitting: Family Medicine

## 2019-06-11 NOTE — Telephone Encounter (Signed)
Medication Refill - Medication:  atorvastatin (LIPITOR) 80 MG tablet  carvedilol (COREG) 6.25 MG tablet   Has the patient contacted their pharmacy? Yes advised to call office. Pt is requesting 90 day script of both.   Preferred Pharmacy (with phone number or street name):  [id number] - [place], [place] Phone:  [phone]  Fax:  [phone]     Agent: Please be advised that RX refills may take up to 3 business days. We ask that you follow-up with your pharmacy.

## 2019-06-18 ENCOUNTER — Other Ambulatory Visit: Payer: Self-pay | Admitting: Family Medicine

## 2019-06-18 MED ORDER — ATORVASTATIN CALCIUM 80 MG PO TABS: 80.0000 mg | ORAL_TABLET | Freq: Every day | ORAL | 3 refills | Status: DC

## 2019-06-18 MED ORDER — CARVEDILOL 6.25 MG PO TABS: 6.2500 mg | ORAL_TABLET | Freq: Two times a day (BID) | ORAL | 3 refills | Status: DC

## 2019-06-18 NOTE — Addendum Note (Signed)
Addended by: Sharon Seller B on: 06/18/2019 09:34 AM   Modules accepted: Orders

## 2019-06-19 ENCOUNTER — Other Ambulatory Visit: Payer: 59 | Admitting: *Deleted

## 2019-06-19 ENCOUNTER — Other Ambulatory Visit: Payer: Self-pay

## 2019-06-19 ENCOUNTER — Encounter (INDEPENDENT_AMBULATORY_CARE_PROVIDER_SITE_OTHER): Payer: Self-pay

## 2019-06-19 DIAGNOSIS — I10 Essential (primary) hypertension: Secondary | ICD-10-CM

## 2019-06-19 DIAGNOSIS — E782 Mixed hyperlipidemia: Secondary | ICD-10-CM

## 2019-06-19 DIAGNOSIS — I252 Old myocardial infarction: Secondary | ICD-10-CM

## 2019-06-20 LAB — BASIC METABOLIC PANEL
BUN/Creatinine Ratio: 20 (ref 10–24)
BUN: 14 mg/dL (ref 8–27)
CO2: 26 mmol/L (ref 20–29)
Calcium: 9.7 mg/dL (ref 8.6–10.2)
Chloride: 104 mmol/L (ref 96–106)
Creatinine, Ser: 0.69 mg/dL — ABNORMAL LOW (ref 0.76–1.27)
GFR calc Af Amer: 119 mL/min/{1.73_m2} (ref >59)
GFR calc non Af Amer: 103 mL/min/{1.73_m2} (ref >59)
Glucose: 154 mg/dL — ABNORMAL HIGH (ref 65–99)
Potassium: 4.5 mmol/L (ref 3.5–5.2)
Sodium: 142 mmol/L (ref 134–144)

## 2019-06-26 ENCOUNTER — Telehealth: Payer: Self-pay | Admitting: Cardiovascular Disease

## 2019-06-26 NOTE — Telephone Encounter (Signed)
New Message  Nicole Kindred, a nurse case Freight forwarder for [employer], is calling about Pt. Would like to speak with Dr. Acie Fredrickson or nurse regarding pt having extreme abdominal pain. If he can't be reached, he said the pt can be reached at [phone].  Please call to discuss

## 2019-06-26 NOTE — Telephone Encounter (Signed)
Spoke with pt, he states pain in lower abdomen, below navel- constant 2-3/10 pain since last night.  Pt indicates not really drinking a lot of fluids, possible constipation.  He will try to hydrate and if pain persists contact PCP for eval.  With pt permission, Notified Nurse at [facility] that pt has been advised.

## 2019-06-27 ENCOUNTER — Ambulatory Visit: Payer: 59 | Admitting: Medical

## 2019-06-27 ENCOUNTER — Ambulatory Visit: Payer: Self-pay | Admitting: *Deleted

## 2019-06-27 NOTE — Telephone Encounter (Signed)
Patient calls with left lower quadrant pain about an inch above the groin that began 2 days ago. With stretching or standing the pain radiates toward the thigh. Loose stool on yesterday morning. Normal stool today. No difficulty voiding but has urgency since beginning Losartan 2 weeks ago during hospital stay with MI and stent placement. Reports he had hernia repair in this area 15+ years ago. Mild nausea at times with decreased appetite. Thermometer reads normal temperature but he feels though he may have low grade fever. Denies CP/SOB/Sweating/Back pain. Reviewed urgent symptoms to seek immediate evaluation if occurred. Warm transferred to [facility] for appointment. Reason for Disposition . [1] MILD pain (e.g., does not interfere with normal activities) AND [2] pain comes and goes (cramps) [3] present > 48 hours  Answer Assessment - Initial Assessment Questions 1. LOCATION: "Where does it hurt?"      Left sided lower abdominal pain 2. RADIATION: "Does the pain shoot anywhere else?" (e.g., chest, back)     Can at times go downwards towards thigh.3. ONSET: "When did the pain begin?" (Minutes, hours or days ago)     Monday evening 4. SUDDEN: "Gradual or sudden onset?"    gradual 5. PATTERN "Does the pain come and go, or is it constant?"    - If constant: "Is it getting better, staying the same, or worsening?"      (Note: Constant means the pain never goes away completely; most serious pain is constant and it progresses)     - If intermittent: "How long does it last?" "Do you have pain now?"     (Note: Intermittent means the pain goes away completely between bouts)     faily constant 6. SEVERITY: "How bad is the pain?"  (e.g., Scale 1-10; mild, moderate, or severe)    - MILD (1-3): doesn't interfere with normal activities, abdomen soft and not tender to touch     - MODERATE (4-7): interferes with normal activities or awakens from sleep, tender to touch     - SEVERE (8-10): excruciating pain, doubled  over, unable to do any normal activities       3 he is at work now 7. RECURRENT SYMPTOM: "Have you ever had this type of abdominal pain before?" If so, ask: "When was the last time?" and "What happened that time?"      no 8. CAUSE: "What do you think is causing the abdominal pain?"     unsure 9. RELIEVING/AGGRAVATING FACTORS: "What makes it better or worse?" (e.g., movement, antacids, bowel movement)     nothing 10. OTHER SYMPTOMS: "Has there been any vomiting, diarrhea, constipation, or urine problems?"      Diarrhea Tuesday morning, loose. Today, bm formed.  Protocols used: ABDOMINAL PAIN - MALE-A-AH

## 2019-06-28 ENCOUNTER — Ambulatory Visit (INDEPENDENT_AMBULATORY_CARE_PROVIDER_SITE_OTHER): Payer: 59 | Admitting: Internal Medicine

## 2019-06-28 ENCOUNTER — Telehealth: Payer: Self-pay | Admitting: *Deleted

## 2019-06-28 ENCOUNTER — Other Ambulatory Visit: Payer: Self-pay

## 2019-06-28 DIAGNOSIS — K5792 Diverticulitis of intestine, part unspecified, without perforation or abscess without bleeding: Secondary | ICD-10-CM | POA: Diagnosis not present

## 2019-06-28 DIAGNOSIS — R1032 Left lower quadrant pain: Secondary | ICD-10-CM | POA: Diagnosis not present

## 2019-06-28 DIAGNOSIS — R7989 Other specified abnormal findings of blood chemistry: Secondary | ICD-10-CM

## 2019-06-28 LAB — POC URINALSYSI DIPSTICK (AUTOMATED)
Bilirubin, UA: NEGATIVE
Blood, UA: NEGATIVE
Glucose, UA: NEGATIVE
Ketones, UA: NEGATIVE
Leukocytes, UA: NEGATIVE
Nitrite, UA: NEGATIVE
Protein, UA: NEGATIVE
Spec Grav, UA: 1.015 (ref 1.010–1.025)
Urobilinogen, UA: 0.2 E.U./dL
pH, UA: 5.5 (ref 5.0–8.0)

## 2019-06-28 LAB — CBC WITH DIFFERENTIAL/PLATELET
Basophils Absolute: 0 10*3/uL (ref 0.0–0.1)
Basophils Relative: 0.2 % (ref 0.0–3.0)
Eosinophils Absolute: 0.3 10*3/uL (ref 0.0–0.7)
Eosinophils Relative: 2.3 % (ref 0.0–5.0)
HCT: 44 % (ref 39.0–52.0)
Hemoglobin: 15.2 g/dL (ref 13.0–17.0)
Lymphocytes Relative: 17.2 % (ref 12.0–46.0)
Lymphs Abs: 2.1 10*3/uL (ref 0.7–4.0)
MCHC: 34.6 g/dL (ref 30.0–36.0)
MCV: 97.1 fl (ref 78.0–100.0)
Monocytes Absolute: 1.2 10*3/uL — ABNORMAL HIGH (ref 0.1–1.0)
Monocytes Relative: 9.9 % (ref 3.0–12.0)
Neutro Abs: 8.4 10*3/uL — ABNORMAL HIGH (ref 1.4–7.7)
Neutrophils Relative %: 70.4 % (ref 43.0–77.0)
Platelets: 205 10*3/uL (ref 150.0–400.0)
RBC: 4.53 Mil/uL (ref 4.22–5.81)
RDW: 12.2 % (ref 11.5–15.5)
WBC: 12 10*3/uL — ABNORMAL HIGH (ref 4.0–10.5)

## 2019-06-28 LAB — COMPREHENSIVE METABOLIC PANEL
ALT: 20 U/L (ref 0–53)
AST: 18 U/L (ref 0–37)
Albumin: 4.5 g/dL (ref 3.5–5.2)
Alkaline Phosphatase: 63 U/L (ref 39–117)
BUN: 30 mg/dL — ABNORMAL HIGH (ref 6–23)
CO2: 27 mEq/L (ref 19–32)
Calcium: 9.9 mg/dL (ref 8.4–10.5)
Chloride: 100 mEq/L (ref 96–112)
Creatinine, Ser: 1.63 mg/dL — ABNORMAL HIGH (ref 0.40–1.50)
GFR: 43.35 mL/min — ABNORMAL LOW (ref >60.00)
Glucose, Bld: 188 mg/dL — ABNORMAL HIGH (ref 70–99)
Potassium: 4.6 mEq/L (ref 3.5–5.1)
Sodium: 138 mEq/L (ref 135–145)
Total Bilirubin: 1.7 mg/dL — ABNORMAL HIGH (ref 0.2–1.2)
Total Protein: 7 g/dL (ref 6.0–8.3)

## 2019-06-28 MED ORDER — AMOXICILLIN-POT CLAVULANATE 875-125 MG PO TABS: 1.0000 | ORAL_TABLET | Freq: Two times a day (BID) | ORAL | 0 refills | Status: DC

## 2019-06-28 NOTE — Progress Notes (Signed)
Subjective:    Patient ID: Kyle Barber, male    DOB: Jan 21, 1961, 58 y.o.   MRN: [id number]  DOS:  06/28/2019 Type of visit - description: Virtual Visit via Video Note  I connected with the above patient  by a video enabled telemedicine application and verified that I am speaking with the correct person using two identifiers.   THIS ENCOUNTER IS A VIRTUAL VISIT DUE TO COVID-19 - PATIENT WAS NOT SEEN IN THE OFFICE. PATIENT HAS CONSENTED TO VIRTUAL VISIT / TELEMEDICINE VISIT   Location of patient: home  Location of provider: office  I discussed the limitations of evaluation and management by telemedicine and the availability of in person appointments. The patient expressed understanding and agreed to proceed.  History of Present Illness: Acute The patient developed symptoms 3 days ago:  Left lower abdominal discomfort (suprapubic area), subjective fever, some malaise. The pain is almost a steady, increased with certain movements. No bulging or mass noted per pt. No history of previous diverticulitis, I do not see a colonoscopy in the chart.  Review of Systems Mild subjective fever, some chills. Appetite is slightly decreased but he has been doing a bland diet with no problems Minimal if any nausea. No vomiting Last bowel movement this morning, small amount No dysuria or gross hematuria  Past Medical History:  Diagnosis Date  . Asthma 02/27/2016  . Diabetes mellitus without complication ([place])   . Hypertension   . Pancreatitis   . Premature ventricular contraction     Past Surgical History:  Procedure Laterality Date  . APPENDECTOMY    . CORONARY STENT INTERVENTION N/A 05/14/2019   Procedure: CORONARY STENT INTERVENTION;  Surgeon: Sherren Mocha, MD;  Location: [facility];  Service: Cardiovascular;  Laterality: N/A;  . HERNIA REPAIR    . LEFT HEART CATH AND CORONARY ANGIOGRAPHY N/A 05/14/2019   Procedure: LEFT HEART CATH AND CORONARY ANGIOGRAPHY;  Surgeon:  Sherren Mocha, MD;  Location: [facility];  Service: Cardiovascular;  Laterality: N/A;    Social History   Socioeconomic History  . Marital status: Divorced    Spouse name: Not on file  . Number of children: Not on file  . Years of education: Not on file  . Highest education level: Not on file  Occupational History  . Not on file  Tobacco Use  . Smoking status: Former Smoker    Packs/day: 0.25    Years: 10.00    Pack years: 2.50    Types: Cigarettes    Quit date: 07/01/2011    Years since quitting: 7.9  . Smokeless tobacco: Former Systems developer    Quit date: 06/29/2011  Substance and Sexual Activity  . Alcohol use: Yes    Comment: weekly  . Drug use: No  . Sexual activity: Not on file  Other Topics Concern  . Not on file  Social History Narrative  . Not on file   Social Determinants of Health   Financial Resource Strain:   . Difficulty of Paying Living Expenses: Not on file  Food Insecurity:   . Worried About Charity fundraiser in the Last Year: Not on file  . Ran Out of Food in the Last Year: Not on file  Transportation Needs:   . Lack of Transportation (Medical): Not on file  . Lack of Transportation (Non-Medical): Not on file  Physical Activity:   . Days of Exercise per Week: Not on file  . Minutes of Exercise per Session: Not on file  Stress:   . Feeling of Stress : Not on file  Social Connections:   . Frequency of Communication with Friends and Family: Not on file  . Frequency of Social Gatherings with Friends and Family: Not on file  . Attends Religious Services: Not on file  . Active Member of Clubs or Organizations: Not on file  . Attends BankerClub or Organization Meetings: Not on file  . Marital Status: Not on file  Intimate Partner Violence:   . Fear of Current or Ex-Partner: Not on file  . Emotionally Abused: Not on file  . Physically Abused: Not on file  . Sexually Abused: Not on file      Allergies as of 06/28/2019      Reactions   Codeine Nausea  Only      Medication List       Accurate as of June 28, 2019 10:27 AM. If you have any questions, ask your nurse or doctor.        albuterol 108 (90 Base) MCG/ACT inhaler Commonly known as: VENTOLIN HFA Inhale 1-2 puffs into the lungs every 6 (six) hours as needed for wheezing or shortness of breath.   aspirin 81 MG chewable tablet Chew 1 tablet (81 mg total) by mouth daily.   atorvastatin 80 MG tablet Commonly known as: LIPITOR Take 1 tablet (80 mg total) by mouth daily at 6 PM.   carvedilol 6.25 MG tablet Commonly known as: COREG Take 1 tablet (6.25 mg total) by mouth 2 (two) times daily with a meal.   glucose blood test strip Test blood sugars 2 times daily.   losartan 50 MG tablet Commonly known as: COZAAR Take 1 tablet (50 mg total) by mouth daily.   nitroGLYCERIN 0.4 MG SL tablet Commonly known as: NITROSTAT Place 1 tablet (0.4 mg total) under the tongue every 5 (five) minutes as needed for chest pain.   Ozempic (0.25 or 0.5 MG/DOSE) 2 MG/1.5ML Sopn Generic drug: Semaglutide(0.25 or 0.5MG /DOS) Inject 0.5 mg as directed once a week.   ticagrelor 90 MG Tabs tablet Commonly known as: BRILINTA Take 1 tablet (90 mg total) by mouth 2 (two) times daily.           Objective:   Physical Exam There were no vitals taken for this visit. This is a virtual video visit, he is alert oriented x3, no apparent distress.    Assessment     58 year old male, PMH includes high cholesterol, hypertension, CAD NSTEMI 04/2019, asthma, DM, history of alcohol abuse, presents with  LLQ abdominal pain: Symptoms are suspicious for first episodes of diverticulitis.  He has a history of appendectomy, also a history of left hernia repair with mesh.  I offered the patient 2 options: Go to the ER for blood work, CAT scan and full work-up. Or go to my office, get a CBC, CMP, UA, urine culture and RX antibiotics understanding that he is being treated empirically for possible  diverticulitis but if he is not quickly improving he must go to the emergency room for a full work-up. He elected outpatient treatment, again will do a CBC, CMP, UA urine culture today, I sent a prescription for Augmentin. Needs to see PCP in 2 weeks for a checkup. The patient knows and understands the limitation of a virtual visit.  Addendum: Spoke with patient about results. WBC is slightly elevated , proceed w/ antibiotics. Urinalysis is normal. Creatinine increased, although losartan was prescribed several weeks ago he just started taking it last week. Plan:  Decrease losartan 50 mg to half tablet daily.  Monitor BPs, increase carvedilol to 3 times a day if BPs increase.  Recheck a BMP next week.  Drink plenty fluids, avoid NSAIDs  Today, I spent more than 42   min with the patient: >50% of the time counseling regards his acute symptoms, treatment options, subsequently addressing labs that were abnormal and adjusting his medications.    I discussed the assessment and treatment plan with the patient. The patient was provided an opportunity to ask questions and all were answered. The patient agreed with the plan and demonstrated an understanding of the instructions.   The patient was advised to call back or seek an in-person evaluation if the symptoms worsen or if the condition fails to improve as anticipated.

## 2019-06-28 NOTE — Telephone Encounter (Signed)
Left message on machine for patient to call back to make a follow up appointment with Dr. Nani Ravens in 2 weeks.

## 2019-06-30 LAB — URINE CULTURE
MICRO NUMBER:: 1245117
Result:: NO GROWTH
SPECIMEN QUALITY:: ADEQUATE

## 2019-07-02 ENCOUNTER — Other Ambulatory Visit: Payer: Self-pay | Admitting: *Deleted

## 2019-07-02 DIAGNOSIS — I10 Essential (primary) hypertension: Secondary | ICD-10-CM

## 2019-07-03 ENCOUNTER — Other Ambulatory Visit (INDEPENDENT_AMBULATORY_CARE_PROVIDER_SITE_OTHER): Payer: 59

## 2019-07-03 ENCOUNTER — Other Ambulatory Visit: Payer: Self-pay

## 2019-07-03 DIAGNOSIS — I10 Essential (primary) hypertension: Secondary | ICD-10-CM

## 2019-07-03 LAB — BASIC METABOLIC PANEL
BUN: 18 mg/dL (ref 6–23)
CO2: 28 mEq/L (ref 19–32)
Calcium: 9.8 mg/dL (ref 8.4–10.5)
Chloride: 102 mEq/L (ref 96–112)
Creatinine, Ser: 0.74 mg/dL (ref 0.40–1.50)
GFR: 107.83 mL/min (ref >60.00)
Glucose, Bld: 161 mg/dL — ABNORMAL HIGH (ref 70–99)
Potassium: 4.4 mEq/L (ref 3.5–5.1)
Sodium: 140 mEq/L (ref 135–145)

## 2019-07-06 ENCOUNTER — Telehealth: Payer: Self-pay | Admitting: Internal Medicine

## 2019-07-06 NOTE — Telephone Encounter (Signed)
Please call the patient Blood work came back better, we recently reduced his losartan dose, how is his blood pressure?. If they remain normal, arrange a visit with PCP in 4 weeks.

## 2019-07-06 NOTE — Telephone Encounter (Signed)
LM for pt to returncall

## 2019-07-09 NOTE — Telephone Encounter (Signed)
Patient notified and patient scheduled with pcp.

## 2019-07-24 ENCOUNTER — Telehealth (HOSPITAL_COMMUNITY): Payer: Self-pay

## 2019-07-24 NOTE — Telephone Encounter (Signed)
Cardiac Rehab Note:  Called pt to see if he was interested in scheduling for in-person Cardiac Rehab Orientation and Virtual Cardiac rehab. HIPAA compliant VM message left requesting call back.   Kyle Barber E. Suzie Portela RN, BSN [place]. [facility] [phone] Dial Direct: [phone]

## 2019-08-14 ENCOUNTER — Telehealth: Payer: Self-pay | Admitting: Family Medicine

## 2019-08-14 ENCOUNTER — Encounter (HOSPITAL_BASED_OUTPATIENT_CLINIC_OR_DEPARTMENT_OTHER): Payer: Self-pay

## 2019-08-14 ENCOUNTER — Other Ambulatory Visit: Payer: Self-pay

## 2019-08-14 ENCOUNTER — Emergency Department (HOSPITAL_BASED_OUTPATIENT_CLINIC_OR_DEPARTMENT_OTHER)
Admission: EM | Admit: 2019-08-14 | Discharge: 2019-08-14 | Disposition: A | Discharge status: Home / Self Care | Payer: 59 | Attending: Emergency Medicine | Admitting: Emergency Medicine

## 2019-08-14 ENCOUNTER — Emergency Department (HOSPITAL_BASED_OUTPATIENT_CLINIC_OR_DEPARTMENT_OTHER): Payer: 59

## 2019-08-14 DIAGNOSIS — Z20822 Contact with and (suspected) exposure to covid-19: Secondary | ICD-10-CM | POA: Diagnosis not present

## 2019-08-14 DIAGNOSIS — I252 Old myocardial infarction: Secondary | ICD-10-CM | POA: Diagnosis not present

## 2019-08-14 DIAGNOSIS — J45909 Unspecified asthma, uncomplicated: Secondary | ICD-10-CM | POA: Diagnosis not present

## 2019-08-14 DIAGNOSIS — I10 Essential (primary) hypertension: Secondary | ICD-10-CM | POA: Diagnosis not present

## 2019-08-14 DIAGNOSIS — R079 Chest pain, unspecified: Secondary | ICD-10-CM | POA: Diagnosis present

## 2019-08-14 DIAGNOSIS — Z79899 Other long term (current) drug therapy: Secondary | ICD-10-CM | POA: Diagnosis not present

## 2019-08-14 DIAGNOSIS — E119 Type 2 diabetes mellitus without complications: Secondary | ICD-10-CM | POA: Diagnosis not present

## 2019-08-14 DIAGNOSIS — Z87891 Personal history of nicotine dependence: Secondary | ICD-10-CM | POA: Insufficient documentation

## 2019-08-14 DIAGNOSIS — R0789 Other chest pain: Secondary | ICD-10-CM | POA: Diagnosis not present

## 2019-08-14 DIAGNOSIS — Z7982 Long term (current) use of aspirin: Secondary | ICD-10-CM | POA: Insufficient documentation

## 2019-08-14 HISTORY — DX: Acute myocardial infarction, unspecified: I21.9

## 2019-08-14 LAB — BASIC METABOLIC PANEL
Anion gap: 11 (ref 5–15)
BUN: 14 mg/dL (ref 6–20)
CO2: 25 mmol/L (ref 22–32)
Calcium: 9.7 mg/dL (ref 8.9–10.3)
Chloride: 101 mmol/L (ref 98–111)
Creatinine, Ser: 0.76 mg/dL (ref 0.61–1.24)
GFR calc Af Amer: >60 mL/min (ref >60)
GFR calc non Af Amer: >60 mL/min (ref >60)
Glucose, Bld: 125 mg/dL — ABNORMAL HIGH (ref 70–99)
Potassium: 3.9 mmol/L (ref 3.5–5.1)
Sodium: 137 mmol/L (ref 135–145)

## 2019-08-14 LAB — CBC
HCT: 46 % (ref 39.0–52.0)
Hemoglobin: 16.2 g/dL (ref 13.0–17.0)
MCH: 33.2 pg (ref 26.0–34.0)
MCHC: 35.2 g/dL (ref 30.0–36.0)
MCV: 94.3 fL (ref 80.0–100.0)
Platelets: 187 10*3/uL (ref 150–400)
RBC: 4.88 MIL/uL (ref 4.22–5.81)
RDW: 12 % (ref 11.5–15.5)
WBC: 9.4 10*3/uL (ref 4.0–10.5)
nRBC: 0 % (ref 0.0–0.2)

## 2019-08-14 LAB — TROPONIN I (HIGH SENSITIVITY): Troponin I (High Sensitivity): 8 ng/L (ref <18)

## 2019-08-14 MED ORDER — SODIUM CHLORIDE 0.9% FLUSH
3.0000 mL | Freq: Once | INTRAVENOUS | Status: DC
  Filled 2019-08-14: qty 3

## 2019-08-14 NOTE — ED Provider Notes (Signed)
[facility] EMERGENCY DEPARTMENT Provider Note   CSN: [id number] Arrival date & time: 08/14/19  1531     History Chief Complaint  Patient presents with  . Chest Pain    Kyle Barber is a 59 y.o. male.  Kyle Barber is a 59 y.o. male with a history of hypertension, diabetes, NSTEMI s/p PCI, PVCs, asthma, pancreatitis, who presents to the emergency department for evaluation of chest pains that began yesterday.  He reports he first noticed pains in the morning, he states it is a brief pain that starts in the center of his chest and radiates out across both sides of the chest, pain only lasts a few seconds and then goes away but happens numerous times throughout the day.  He states sometimes it happens as often as every 5 minutes but other times he will go several hours without having the pain.  He also states that he has noticed some dull aching pains over his left and right lateral ribs that are constant but mild.  He questions whether this could be related to occasional chronic cough from his asthma.  He states he has had some body aches over his shoulders and upper back as well.  He denies any pleuritic pain he has not had any shortness of breath or increased work of breathing.  He denies any lightheadedness or syncope.  No lower extremity swelling.  No abdominal pain, nausea, vomiting or diaphoresis.  When patient had NSTEMI he reports he had searing pain in both of his arms and he has not had any arm pain over the past 2 days.  He is followed by Dr. Elease Hashimoto with cardiology, he has an upcoming appointment next month, he had a DES to the left circumflex in November.  He is still on Brilinta and aspirin, no other blood thinners.  No history of blood clots, no recent long distance travel or surgery.        Past Medical History:  Diagnosis Date  . Asthma 02/27/2016  . Diabetes mellitus without complication (HCC)   . Hypertension   . Myocardial infarct (HCC)   . Pancreatitis     . Premature ventricular contraction     Patient Active Problem List   Diagnosis Date Noted  . Chest pain 05/14/2019  . Cough 05/14/2019  . Non-ST elevation (NSTEMI) myocardial infarction (HCC) 05/14/2019  . Screen for colon cancer 04/06/2018  . Diabetes mellitus type 2 in obese (HCC) 02/27/2016  . Asthma 02/27/2016  . Elevated triglycerides with high cholesterol 08/05/2011  . Alcoholism (HCC) 08/01/2011    Past Surgical History:  Procedure Laterality Date  . APPENDECTOMY    . CORONARY STENT INTERVENTION N/A 05/14/2019   Procedure: CORONARY STENT INTERVENTION;  Surgeon: Tonny Bollman, MD;  Location: [facility] INVASIVE CV LAB;  Service: Cardiovascular;  Laterality: N/A;  . HERNIA REPAIR    . LEFT HEART CATH AND CORONARY ANGIOGRAPHY N/A 05/14/2019   Procedure: LEFT HEART CATH AND CORONARY ANGIOGRAPHY;  Surgeon: Tonny Bollman, MD;  Location: [facility] INVASIVE CV LAB;  Service: Cardiovascular;  Laterality: N/A;       Family History  Problem Relation Age of Onset  . Emphysema Mother   . Diabetes Father   . CAD Father        Died of MI at age 60  . Diabetes Paternal Grandmother   . CAD Paternal Grandfather        died at age 65    Social History   Tobacco Use  .  Smoking status: Former Smoker    Packs/day: 0.25    Years: 10.00    Pack years: 2.50    Types: Cigarettes    Quit date: 07/01/2011    Years since quitting: 8.1  . Smokeless tobacco: Former Neurosurgeon    Quit date: 06/29/2011  Substance Use Topics  . Alcohol use: Yes    Comment: weekly  . Drug use: No    Home Medications Prior to Admission medications   Medication Sig Start Date End Date Taking? Authorizing Provider  albuterol (VENTOLIN HFA) 108 (90 Base) MCG/ACT inhaler Inhale 1-2 puffs into the lungs every 6 (six) hours as needed for wheezing or shortness of breath. 04/10/19   Sharlene Dory, DO  amoxicillin-clavulanate (AUGMENTIN) 875-125 MG tablet Take 1 tablet by mouth 2 (two) times daily. 06/28/19   Wanda Plump, MD  aspirin 81 MG chewable tablet Chew 1 tablet (81 mg total) by mouth daily. 05/15/19   Kroeger, Ovidio Kin., PA-C  atorvastatin (LIPITOR) 80 MG tablet Take 1 tablet (80 mg total) by mouth daily at 6 PM. 06/18/19   Wendling, Jilda Roche, DO  carvedilol (COREG) 6.25 MG tablet Take 1 tablet (6.25 mg total) by mouth 2 (two) times daily with a meal. 06/18/19   Wendling, Jilda Roche, DO  glucose blood test strip Test blood sugars 2 times daily. 04/05/18   Sharlene Dory, DO  losartan (COZAAR) 50 MG tablet Take 1 tablet (50 mg total) by mouth daily. 05/28/19 05/22/20  Nahser, Deloris Ping, MD  nitroGLYCERIN (NITROSTAT) 0.4 MG SL tablet Place 1 tablet (0.4 mg total) under the tongue every 5 (five) minutes as needed for chest pain. 05/15/19   Kroeger, Ovidio Kin., PA-C  Semaglutide,0.25 or 0.5MG /DOS, (OZEMPIC, 0.25 OR 0.5 MG/DOSE,) 2 MG/1.5ML SOPN Inject 0.5 mg as directed once a week. 05/18/19   Sharlene Dory, DO  ticagrelor (BRILINTA) 90 MG TABS tablet Take 1 tablet (90 mg total) by mouth 2 (two) times daily. 05/15/19   Kroeger, Ovidio Kin., PA-C    Allergies    Codeine  Review of Systems   Review of Systems  Constitutional: Negative for chills and fever.  Respiratory: Negative for cough and shortness of breath.   Cardiovascular: Positive for chest pain. Negative for palpitations and leg swelling.  Gastrointestinal: Negative for abdominal pain, nausea and vomiting.  Genitourinary: Negative for dysuria and hematuria.  Musculoskeletal: Negative for arthralgias and myalgias.  Skin: Negative for color change and rash.  Neurological: Negative for dizziness, syncope and light-headedness.    Physical Exam Updated Vital Signs BP (!) 162/99 (BP Location: Left Arm)   Pulse 69   Temp (!) 97.5 F (36.4 C) (Oral)   Resp 18   SpO2 100%   Physical Exam Vitals and nursing note reviewed.  Constitutional:      General: He is not in acute distress.    Appearance: He is well-developed  and normal weight. He is not ill-appearing or diaphoretic.  HENT:     Head: Normocephalic and atraumatic.  Eyes:     General:        Right eye: No discharge.        Left eye: No discharge.     Pupils: Pupils are equal, round, and reactive to light.  Cardiovascular:     Rate and Rhythm: Normal rate and regular rhythm.     Pulses:          Radial pulses are 2+ on the right side and 2+ on the left  side.       Dorsalis pedis pulses are 2+ on the right side and 2+ on the left side.     Heart sounds: Normal heart sounds. No murmur. No friction rub. No gallop.   Pulmonary:     Effort: Pulmonary effort is normal. No respiratory distress.     Breath sounds: Normal breath sounds. No wheezing or rales.     Comments: Respirations equal and unlabored, patient able to speak in full sentences, lungs clear to auscultation bilaterally Chest:     Chest wall: Tenderness present.     Comments: There is some chest wall tenderness noted over the right and left lateral lower ribs Abdominal:     General: Bowel sounds are normal. There is no distension.     Palpations: Abdomen is soft. There is no mass.     Tenderness: There is no abdominal tenderness. There is no guarding.  Musculoskeletal:        General: No deformity.     Cervical back: Neck supple.     Right lower leg: No tenderness. No edema.     Left lower leg: No tenderness. No edema.  Skin:    General: Skin is warm and dry.     Capillary Refill: Capillary refill takes less than 2 seconds.  Neurological:     Mental Status: He is alert.     Coordination: Coordination normal.     Comments: Speech is clear, able to follow commands Moves extremities without ataxia, coordination intact  Psychiatric:        Mood and Affect: Mood normal.        Behavior: Behavior normal.     ED Results / Procedures / Treatments   Labs (all labs ordered are listed, but only abnormal results are displayed) Labs Reviewed  BASIC METABOLIC PANEL - Abnormal; Notable  for the following components:      Result Value   Glucose, Bld 125 (*)    All other components within normal limits  NOVEL CORONAVIRUS, NAA (HOSP ORDER, SEND-OUT TO REF LAB; TAT 18-24 HRS)  CBC  TROPONIN I (HIGH SENSITIVITY)    EKG EKG Interpretation  Date/Time:  Tuesday August 14 2019 15:36:25 EST Ventricular Rate:  68 PR Interval:  204 QRS Duration: 74 QT Interval:  394 QTC Calculation: 418 R Axis:   72 Text Interpretation: Normal sinus rhythm No STEMI Confirmed by Octaviano Glow [phone]) on 08/14/2019 3:42:11 PM   Radiology DG Chest 2 View  Result Date: 08/14/2019 CLINICAL DATA:  Chest pain. Intermittent chest pain since yesterday. EXAM: CHEST - 2 VIEW COMPARISON:  Chest radiograph 05/14/2019 FINDINGS: Heart size within normal limits. No evidence of airspace consolidation within the lungs. Nipple shadows are noted. No evidence of pleural effusion or pneumothorax. Multiple chronic healed right-sided rib fractures. Thoracic spondylosis. No evidence of acute bony abnormality. IMPRESSION: No evidence of acute cardiopulmonary abnormality. Electronically Signed   By: Kellie Simmering DO   On: 08/14/2019 16:19    Procedures Procedures (including critical care time)  Medications Ordered in ED Medications  sodium chloride flush (NS) 0.9 % injection 3 mL (3 mLs Intravenous Not Given 08/14/19 1630)    ED Course  I have reviewed the triage vital signs and the nursing notes.  Pertinent labs & imaging results that were available during my care of the patient were reviewed by me and considered in my medical decision making (see chart for details).    MDM Rules/Calculators/A&P  59 year old male presents with brief chest pains over the past 2 days he reports they start in the center of his chest and shoot out across both sides, they last a few seconds and then resolve.  This does not feel similar to the pain he had before his NSTEMI in November, he has a stent in the left  circumflex, is followed by Dr. Elease Hashimoto with cardiology.  On arrival he is well-appearing and in no distress he has normal vitals aside from mild hypertension.  He has had a few brief episodes of chest pain since he has been here he does not coordinate with any PVC arrhythmia on cardiac monitoring, EKG on arrival shows normal sinus rhythm with no ischemic changes which is reassuring.  Will get basic labs troponin and chest x-ray and then discuss with cardiology given recent stenting.  Chest x-ray is clear and lab work is reassuring patient with no leukocytosis and normal hemoglobin, glucose of 125, no other electrolyte derangements and normal renal function.  Troponin is 8, given that symptoms have been present since yesterday morning would expect troponin to be elevated if this were ACS at this point.  Pain sounds atypical, pain is not pleuritic or associated with shortness of breath, tachycardia, tachypnea or hypoxia patient does not have other risk factors for PE and I have low suspicion, discussed with Dr. Paulla Dolly who is in agreement.  Pain is also not consistent with dissection.  Case discussed with Dr. Duke Salvia with cardiology who agree symptoms sound atypical, EKG and normal troponin are very reassuring, given that pain has been present since yesterday, delta troponin is not indicated.  They will get patient into be seen this week in the office, no further work-up in the ED or hospital admission is indicated..  I discussed this plan with the patient who is in agreement.  Patient does wonder whether the body aches could at all be related to Covid he has no fever or other Covid symptoms will send outpatient test just to be safe.  At this time patient is stable for discharge home in good condition.   Final Clinical Impression(s) / ED Diagnoses Final diagnoses:  Atypical chest pain    Rx / DC Orders ED Discharge Orders    None       Legrand Rams 08/14/19 1735    Terald Sleeper,  MD 08/14/19 2212

## 2019-08-14 NOTE — Discharge Instructions (Addendum)
You should receive a call from the cardiology office to schedule follow-up appointment for this week.  Your work-up today was reassuring and does not suggest an acute problem with your heart causing the symptoms.  If you develop any new or worsening chest pain, arm pain, shortness of breath or any other new or concerning symptoms please return to the ED.  You have a Covid test pending and you should quarantine at home until you receive your results, these should be back in about 2 to 3 days, you will receive a phone call if results are positive, you can view negative results online through MyChart.  Please follow the instructions on your paperwork today to set up your MyChart account.

## 2019-08-14 NOTE — ED Triage Notes (Signed)
Pt c/o CP started yesterday-reports he had a cardiac stent placed Nov 2020-NAD-steady gait

## 2019-08-15 ENCOUNTER — Ambulatory Visit (INDEPENDENT_AMBULATORY_CARE_PROVIDER_SITE_OTHER): Payer: 59 | Admitting: Family Medicine

## 2019-08-15 ENCOUNTER — Encounter: Payer: Self-pay | Admitting: Family Medicine

## 2019-08-15 ENCOUNTER — Telehealth: Payer: Self-pay | Admitting: Cardiovascular Disease

## 2019-08-15 VITALS — BP 160/110

## 2019-08-15 DIAGNOSIS — I10 Essential (primary) hypertension: Secondary | ICD-10-CM

## 2019-08-15 LAB — NOVEL CORONAVIRUS, NAA (HOSP ORDER, SEND-OUT TO REF LAB; TAT 18-24 HRS): SARS-CoV-2, NAA: NOT DETECTED

## 2019-08-15 NOTE — Telephone Encounter (Signed)
New message    Pt c/o of Chest Pain: STAT if CP now or developed within 24 hours  1. Are you having CP right now? No   2. Are you experiencing any other symptoms (ex. SOB, nausea, vomiting, sweating)? Sob, ache across back  3. How long have you been experiencing CP? Per patient since 11/10/19  4. Is your CP continuous or coming and going? Coming and going   5. Have you taken Nitroglycerin? no ?

## 2019-08-15 NOTE — Progress Notes (Signed)
Cardiology Office Note   Date:  08/16/2019   ID:  Kyle Barber, DOB 06-18-1961, MRN [id number]  PCP:  Sharlene Dory, DO  Cardiologist:  Dr. Elease Hashimoto, MD   Chief Complaint  Patient presents with  . Chest Pain    History of Present Illness: Kyle Barber is a 59 y.o. male who presents for post ED follow up/chest pain, seen for Dr. Elease Hashimoto.   Kyle Barber has a history of CAD s/p NSTEMI 04/2019, DM2, tobacco use, pancreatitis and hypertension.   He was admitted to the hospital 05/14/2019 with NSTEMI.  Initial symptoms were abdominal pain with radiation to his shoulder and down his left arm. His symptoms resolved with sublingual nitroglycerin.  HST levels were elevated at [facility] arrival therefore he was transferred to [facility] for urgent cardiac catheterization found to have a total occlusion of his LCx which was successfully stented.  He did have moderate mid LAD stenosis.  He was placed on DAPT with ASA as well as Brilinta without complication.  He was most recently seen in hospital follow-up 05/28/2019.  At that time, he had stopped smoking completely due to his NSTEMI.  His amlodipine was discontinued and he was started on losartan 50 as well as carvedilol 6.25.  He was continued on atorvastatin  Unfortunately, he was seen in the ED on 08/14/2019 with chest pain described as a central, anterior pain that was  brief in duration which radiated across both the right and left sides of his chest only lasting a few seconds then would dissipate on its own however was occuring several times throughout the day.  He reported that when he had his non-STEMI he had searing pain in bilateral arms as well as his abdomen. EKG was found to be nonischemic. CXR with no acute cardiopulmonary abnormality. Lab work with no metabolic or electrolyte derangements. Troponin was found to be 8, not consistent with ACS.  Case discussed with hospital DOD, Dr. Duke Salvia who felt that given  the atypical nature of his symptoms along with a reassuring EKG as well as normal troponin plan was for hospital discharge with close follow-up.  Per cath report, recommendations were to favor medical therapy for residual CAD unless recurrent anginal/ischemic symptoms.   Today Kyle Barber is seen via telemedicine visit secondary to inclement weather and office closure.  He continues to have centralized chest pain symptoms which are not associated with exertion, eating or deep inspiration.  He states that they are not reminiscent of his prior MI given no bilateral arm involvement.  He reports that the pain will start in the central chest and will radiate out to both the left and right sides.  He has no associated symptoms such as shortness of breath, palpitations, LE edema, orthopnea however does have some mild dyspnea with exertion which is been unchanged since his intervention 04/2019.  He reports his BP was previously very well controlled with SBP's in the 120/80 range however more recently has noticed his BPs have been in the 160/100 range.  He states soon after his heart attack, he was very consistent with BP checks at home however has fallen off over the last several months.  When he initially began having his symptoms, he called his PCPs office who recommended the ED visit.  He again had a virtual follow-up with his PCP on 08/15/2019 at which time his BP remained elevated at 160/110.  Recommendations at that time were to increase his  carvedilol from 6.25 mg to 12.5 mg.  He has been reluctant to make this change until our visit today.  We discussed continuing the carvedilol at 12.5 mg p.o. twice daily for better BP control and will initiate low-dose Imdur 15 mg p.o. daily with very close follow-up next week.  I will send this note to Dr. Acie Fredrickson for review as he could potentially need further cardiac evaluation with LHC given residual LAD disease noted to be approximately 50% on cath.    Past Medical  History:  Diagnosis Date  . Asthma 02/27/2016  . Diabetes mellitus without complication ([place])   . Hypertension   . Myocardial infarct ([place])   . Pancreatitis   . Premature ventricular contraction     Past Surgical History:  Procedure Laterality Date  . APPENDECTOMY    . CORONARY STENT INTERVENTION N/A 05/14/2019   Procedure: CORONARY STENT INTERVENTION;  Surgeon: Sherren Mocha, MD;  Location: [place] [facility];  Service: Cardiovascular;  Laterality: N/A;  . HERNIA REPAIR    . LEFT HEART CATH AND CORONARY ANGIOGRAPHY N/A 05/14/2019   Procedure: LEFT HEART CATH AND CORONARY ANGIOGRAPHY;  Surgeon: Sherren Mocha, MD;  Location: [place] [facility];  Service: Cardiovascular;  Laterality: N/A;     Current Outpatient Medications  Medication Sig Dispense Refill  . albuterol (VENTOLIN HFA) 108 (90 Base) MCG/ACT inhaler Inhale 1-2 puffs into the lungs every 6 (six) hours as needed for wheezing or shortness of breath. 6.7 g 1  . aspirin 81 MG chewable tablet Chew 1 tablet (81 mg total) by mouth daily. 90 tablet 3  . atorvastatin (LIPITOR) 80 MG tablet Take 1 tablet (80 mg total) by mouth daily at 6 PM. 90 tablet 3  . carvedilol (COREG) 6.25 MG tablet Take 1 tablet (6.25 mg total) by mouth 2 (two) times daily with a meal. 60 tablet 3  . glucose blood test strip Test blood sugars 2 times daily. 100 each 5  . losartan (COZAAR) 50 MG tablet Take 25 mg by mouth daily.    . nitroGLYCERIN (NITROSTAT) 0.4 MG SL tablet Place 1 tablet (0.4 mg total) under the tongue every 5 (five) minutes as needed for chest pain. 25 tablet 3  . Semaglutide,0.25 or 0.5MG /DOS, (OZEMPIC, 0.25 OR 0.5 MG/DOSE,) 2 MG/1.5ML SOPN Inject 0.5 mg as directed once a week. 4.5 mL 1  . ticagrelor (BRILINTA) 90 MG TABS tablet Take 1 tablet (90 mg total) by mouth 2 (two) times daily. 180 tablet 3   No current facility-administered medications for this visit.    Allergies:   Codeine    Social History:  The patient  reports that  he quit smoking about 8 years ago. His smoking use included cigarettes. He has a 2.50 pack-year smoking history. He quit smokeless tobacco use about 8 years ago. He reports current alcohol use. He reports that he does not use drugs.   Family History:  The patient's family history includes CAD in his father and paternal grandfather; Diabetes in his father and paternal grandmother; Emphysema in his mother.    ROS:  Please see the history of present illness. Otherwise, review of systems are positive for none.   All other systems are reviewed and negative.    PHYSICAL EXAM: VS:  BP 137/89 Comment: results at 6:30am  Pulse 69   Ht 5\' 8"  (1.727 m)   Wt 182 lb (82.6 kg)   BMI 27.67 kg/m  , BMI Body mass index is 27.67 kg/m.   General:  Well developed, NAD Neuro: Alert and oriented. MAE spontaneously. Psych: Responds to questions appropriately with normal affect.     EKG:  EKG is not ordered today.   Prior EKGs from ED visit personally reviewed  Recent Labs: 06/28/2019: ALT 20 08/14/2019: BUN 14; Creatinine, Ser 0.76; Hemoglobin 16.2; Platelets 187; Potassium 3.9; Sodium 137    Lipid Panel    Component Value Date/Time   CHOL 167 05/15/2019 0317   TRIG 414 (H) 05/15/2019 0317   HDL 32 (L) 05/15/2019 0317   CHOLHDL 5.2 05/15/2019 0317   VLDL UNABLE TO CALCULATE IF TRIGLYCERIDE OVER 400 mg/dL 26/94/8546 2703   LDLCALC UNABLE TO CALCULATE IF TRIGLYCERIDE OVER 400 mg/dL 50/02/3817 2993   LDLDIRECT 77.5 05/15/2019 0317      Wt Readings from Last 3 Encounters:  08/16/19 182 lb (82.6 kg)  05/28/19 197 lb 12.8 oz (89.7 kg)  05/14/19 201 lb 4.8 oz (91.3 kg)     Other studies Reviewed: Additional studies/ records that were personally reviewed today include:   Echocardiogram 05/14/2019:   1. Left ventricular ejection fraction, by visual estimation, is 55 to  60%. The left ventricle has normal function. There is no left ventricular  hypertrophy.  2. Basal and mid anterolateral  wall and basal and mid inferolateral wall  are abnormal.  3. Definity contrast agent was given IV to delineate the left ventricular  endocardial borders.  4. The left ventricle demonstrates regional wall motion abnormalities.  5. Normal LV EF with mild hypokinesis of base to mid  inferior/inferolateral wall.  6. Global right ventricle has normal systolic function.The right  ventricular size is normal. No increase in right ventricular wall  thickness.  7. Left atrial size was normal.  8. Right atrial size was normal.  9. Trivial pericardial effusion is present.  10. The mitral valve is normal in structure. No evidence of mitral valve  regurgitation. No evidence of mitral stenosis.  11. The tricuspid valve is normal in structure. Tricuspid valve  regurgitation is not demonstrated.  12. The aortic valve is tricuspid. Aortic valve regurgitation is not  visualized. No evidence of aortic valve sclerosis or stenosis.  13. The pulmonic valve was grossly normal. Pulmonic valve regurgitation is  not visualized.  14. TR signal is inadequate for assessing pulmonary artery systolic  pressure.  15. The inferior vena cava is normal in size with greater than 50%  respiratory variability, suggesting right atrial pressure of 3 mmHg.    LHC 05/14/2019:  1. Acute total occlusion of the left circumflex, treated successfully with stenting (3.0x18 mm Resolute Onyx DES) 2. Moderate mid-LAD stenosis 3. Patent RCA (dominant vessel)  Recommend: DAPT with ASA and ticagrelor x 12 months. Favor medical therapy for residual CAD unless recurrent angina/ischemic symptoms.   ASSESSMENT AND PLAN:  1.  Chest pain with CAD s/p NSTEMI with PCI/DES to LCx and known residual disease in the mid LAD: -Continue DAPT with ASA, Brilinta>> reports compliance -Continue carvedilol 12.5 p.o. twice daily as increased by his PCP, losartan 50 -We will add low-dose Imdur 15 mg p.o. daily and reassess symptoms next  week -Given negative HsT and stable EKG during ED evaluation along with atypical (nonexertional) chest pain, nonreminiscent of prior MI -We will continue with the above with low threshold for LHC if no symptom improvement -We will send note to Dr. Elease Hashimoto for review -ED precautions reviewed  2.  Hypertension: -Labile, previously very stable with SBP's in the 120 range however more recently has had readings of  160/100, 163/110, 160/110 -PCP increase his carvedilol to 12.5 mg p.o. twice daily, agree with this plan -We will add low-dose Imdur and follow for reassessment next week -LHC plan as above  3.  HLD/hypertriglyceridemia: -Last LDL, unable to calculate secondary to high triglycerides (secondary to pancreatitis) -Last triglycerides noted to be significantly elevated at 414 -Continue high intensity atorvastatin   Current medicines are reviewed at length with the patient today.  The patient does not have concerns regarding medicines.  The following changes have been made: Increase carvedilol to 12.5 mg p.o. twice daily, add Imdur 15 mg p.o. daily  Labs/ tests ordered today include: None  No orders of the defined types were placed in this encounter.  Disposition:   FU with Dr. Elease Hashimoto or APP in 1 week  Signed, Georgie Chard, NP  08/16/2019 10:56 AM    [facility] [address] [place], [place], [place]  [address] Phone: [phone]; Fax: [phone]

## 2019-08-15 NOTE — Telephone Encounter (Signed)
I spoke with patient. He has been having shortness of breath for several months. Is not worse today. Has brief pains in chest. Was seen in ED yesterday for same symptoms. States symptoms are the same today. He is scheduled for post ED follow up on 08/16/19 with Georgie Chard, NP.

## 2019-08-15 NOTE — Progress Notes (Signed)
Chief Complaint  Patient presents with  . Follow-up    ED visit on 08/14/2019-Chest pain    Subjective Kyle Barber is a 59 y.o. male who presents for hypertension follow up. Due to COVID-19 pandemic, we are interacting via web portal for an electronic face-to-face visit. I verified patient's ID using 2 identifiers. Patient agreed to proceed with visit via this method. Patient is at home, I am at office. Patient and I are present for visit.  He does monitor home blood pressures. Blood pressures ranging from 160's/100's on average. He is compliant with medication- Coreg 6.25 mg bid and Losartan 50 mg/d. Patient has these side effects of medication: none He is adhering to a healthy diet overall. Current exercise: walking   Past Medical History:  Diagnosis Date  . Asthma 02/27/2016  . Diabetes mellitus without complication ([facility])   . Hypertension   . Myocardial infarct ([facility])   . Pancreatitis   . Premature ventricular contraction     Review of Systems Cardiovascular: no chest pain Respiratory:  no shortness of breath  Exam BP (!) 160/110  No conversational dyspnea Age appropriate judgment and insight Nml affect and mood  Essential hypertension  Increase Coreg from 6.25 mg bid to 12.5 mg bid. Send message in 1 week with update. He will take 2 pills twice daily until next week.  Counseled on diet and exercise. F/u in 2 weeks with me to reck BP. The patient voiced understanding and agreement to the plan.  Jilda Roche Echo, DO 08/15/19  1:30 PM

## 2019-08-16 ENCOUNTER — Telehealth (INDEPENDENT_AMBULATORY_CARE_PROVIDER_SITE_OTHER): Payer: 59 | Admitting: Cardiology

## 2019-08-16 ENCOUNTER — Encounter: Payer: Self-pay | Admitting: Cardiology

## 2019-08-16 ENCOUNTER — Other Ambulatory Visit: Payer: Self-pay

## 2019-08-16 VITALS — BP 137/89 | HR 69 | Ht 68.0 in | Wt 182.0 lb

## 2019-08-16 DIAGNOSIS — E782 Mixed hyperlipidemia: Secondary | ICD-10-CM

## 2019-08-16 DIAGNOSIS — I252 Old myocardial infarction: Secondary | ICD-10-CM

## 2019-08-16 DIAGNOSIS — R079 Chest pain, unspecified: Secondary | ICD-10-CM | POA: Diagnosis not present

## 2019-08-16 DIAGNOSIS — I10 Essential (primary) hypertension: Secondary | ICD-10-CM | POA: Diagnosis not present

## 2019-08-16 MED ORDER — CARVEDILOL 12.5 MG PO TABS: 12.5000 mg | ORAL_TABLET | Freq: Two times a day (BID) | ORAL | 3 refills | Status: DC

## 2019-08-16 MED ORDER — ISOSORBIDE MONONITRATE ER 30 MG PO TB24: 15.0000 mg | ORAL_TABLET | Freq: Every day | ORAL | 3 refills | Status: DC

## 2019-08-16 NOTE — Patient Instructions (Addendum)
Medication Instructions:   Your physician has recommended you make the following change in your medication:   1) Start Imdur 30 mg, 0.5 tablet (15 mg) by mouth once a day 2) Increase Carvedilol to 12.5 mg, 1 tablet by mouth twice a day  *If you need a refill on your cardiac medications before your next appointment, please call your pharmacy*  Lab Work:  None ordered today  If you have labs (blood work) drawn today and your tests are completely normal, you will receive your results only by: [name] MyChart Message (if you have MyChart) OR . A paper copy in the mail If you have any lab test that is abnormal or we need to change your treatment, we will call you to review the results.  Testing/Procedures:  None ordered today  Follow-Up: At [facility], you and your health needs are our priority.  As part of our continuing mission to provide you with exceptional heart care, we have created designated Provider Care Teams.  These Care Teams include your primary Cardiologist (physician) and Advanced Practice Providers (APPs -  Physician Assistants and Nurse Practitioners) who all work together to provide you with the care you need, when you need it.  Your next appointment:    On 08/22/19 at 4:20PM with Kristeen Miss, mD

## 2019-08-16 NOTE — Progress Notes (Signed)
This sounds like a good plan to start with .   I'll see him in a week.  We can do further work up if needed.

## 2019-08-17 ENCOUNTER — Telehealth: Payer: Self-pay | Admitting: Cardiology

## 2019-08-17 NOTE — Telephone Encounter (Signed)
Returned call to patient who states he had a horrible headache last night after taking Imdur 15 mg. Reports he took carvedilol 6.25 mg instead of the increased dose that was advised by Georgie Chard, NP yesterday during his virtual visit. He reports his readings last night were :  105/66 mmHg, 104/66, and 103/72.  Reports pulse readings are typically 70-74 bpm.  States today BP is 136/87 mmHg this morning before carvedilol, pulse 74 bpm. States he continues to have some mild chest pains. He reports he has taken carvedilol 6.25 and losartan 25 mg today and BP is currently high. I asked about nutrition and hydration and he admits that he "grazes" and doesn't eat but maybe one full meal per day. He states he has one cup of coffee daily and drinks mostly diet ginger ale the rest of the day. I advised him to drink more water than diet ginger ale, to eat more regularly during the day, particularly prior to taking his Imdur, and to take Tylenol for his headaches. I encouraged him to continue to try to take the Imdur daily and that likely his symptoms will improve in about 5-7 days. He asks how many times he should measure his BP and I advised him to measure just once in the morning, once in the evening, and if he feels poorly. I advised he may reduce his carvedilol to 6.25 mg if SBP <115 mmHg because he states he does feel very fatigued if SBP is close to 100 mmHg. He has an appointment with Dr. Elease Hashimoto on Wed. 2/24 and agrees to work on trying to take the medications as advised by Georgie Chard, NP. I advised him he may call back prior to the appointment with questions or concerns and he thanked me for the call.

## 2019-08-17 NOTE — Telephone Encounter (Signed)
New Message   Pt c/o medication issue:  1. Name of Medication: isosorbide mononitrate (IMDUR) 30 MG 24 hr tablet   2. How are you currently taking this medication (dosage and times per day)? Take 0.5 tablets (15 mg total) by mouth daily  3. Are you having a reaction (difficulty breathing--STAT)? headache  4. What is your medication issue? Patient BP dropped to 105/65. He is concerned. Please call to discuss.

## 2019-08-18 NOTE — Telephone Encounter (Signed)
Agree with note from Eligha Bridegroom, RN . He will follow up with me next week

## 2019-08-22 ENCOUNTER — Encounter: Payer: Self-pay | Admitting: Cardiovascular Disease

## 2019-08-22 ENCOUNTER — Other Ambulatory Visit: Payer: Self-pay

## 2019-08-22 ENCOUNTER — Ambulatory Visit: Payer: 59 | Admitting: Cardiovascular Disease

## 2019-08-22 VITALS — BP 104/68 | HR 81 | Ht 68.0 in | Wt 186.0 lb

## 2019-08-22 DIAGNOSIS — I10 Essential (primary) hypertension: Secondary | ICD-10-CM | POA: Diagnosis not present

## 2019-08-22 DIAGNOSIS — I214 Non-ST elevation (NSTEMI) myocardial infarction: Secondary | ICD-10-CM

## 2019-08-22 MED ORDER — CARVEDILOL 6.25 MG PO TABS: 6.2500 mg | ORAL_TABLET | Freq: Two times a day (BID) | ORAL | Status: DC

## 2019-08-22 NOTE — Patient Instructions (Addendum)
Your physician has recommended you make the following change in your medication:  STOP ISOSORBIDE CARVEDILOL6.25 MG TWICE DAILY Your physician recommends that you return for lab work in: FASTING LIPID LIVER AND BMET Your physician recommends that you schedule a follow-up appointment in:  3  MONTHS WITH JILL 6 MONTHS WITH DR Elease Hashimoto

## 2019-08-22 NOTE — Progress Notes (Signed)
Cardiology Office Note:    Date:  08/22/2019   ID:  Kyle Barber, DOB 1960/08/01, MRN [id number]  PCP:  Shelda Pal, DO  Cardiologist:  Mertie Moores, MD  Electrophysiologist:  None   Referring MD: Shelda Pal*   Chief Complaint  Patient presents with  . Coronary Artery Disease    History of Present Illness:    Kyle Barber is a 59 y.o. male with a hx of DM2,  cigarette smoking, hypertension who was admitted to the hospital on May 14, 2019  with non-ST segment elevation myocardial infarction.  The patient's symptoms were abdominal pain with radiation to the shoulder and down his left arm.  The symptoms resolved with sublingual nitroglycerin.  He had elevated troponin levels and was transferred from the [facility] emergency room to the [facility] where he had an urgent heart catheterization.  At heart catheterization he was found to have a total occlusion of his left circumflex artery which was successfully stented with a 3.0 x 18 mm resolute Onyx DES.  He has moderate mid LAD stenosis.  Recovering well.  No further CP .   Has some fatigue .   Has rare sharp pains. 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  He emailed me a copy of his bands CD recently.  His band  has a really nice sound.  Last Monday, started have cp. Lasted several seconds. BP was elevated  160 / 100   He started having some recent episodes of chest discomfort.  Carvedilol was increased to 12.5 mg twice a day and isosorbide 15 mg a day was added. Pains are not related to exercise,  Eating or drinkg .  Thinks the pain  Was better when he would lie on his back   The fleeting cp has generally improved.   Having fewer episode of these cp BP is much better  These pains  are not similar to his previous NSTEMI symptom ( arm burning , episgastric pain radiating to B  shoulders )   His diet is greatly improved since November. He has a history of extremely high levels of triglycerides.  Past Medical History:  Diagnosis Date  . Asthma 02/27/2016  . Diabetes mellitus without complication ([facility])   . Hypertension   . Myocardial infarct ([place])   . Pancreatitis   . Premature ventricular contraction     Past Surgical History:  Procedure Laterality Date  . APPENDECTOMY    . CORONARY STENT INTERVENTION N/A 05/14/2019   Procedure: CORONARY STENT INTERVENTION;  Surgeon: Sherren Mocha, MD;  Location: [facility];  Service: Cardiovascular;  Laterality: N/A;  . HERNIA REPAIR    . LEFT HEART CATH AND CORONARY ANGIOGRAPHY N/A 05/14/2019   Procedure: LEFT HEART CATH AND CORONARY ANGIOGRAPHY;  Surgeon: Sherren Mocha, MD;  Location: [facility];  Service: Cardiovascular;  Laterality: N/A;    Current Medications: Current Meds  Medication Sig  . albuterol (VENTOLIN HFA) 108 (90 Base) MCG/ACT inhaler Inhale 1-2 puffs into the lungs every 6 (six) hours as needed for wheezing or shortness of breath.  [name] aspirin 81 MG chewable tablet Chew 1 tablet (81 mg total) by mouth daily.  [name] atorvastatin (LIPITOR) 80 MG tablet Take 1 tablet (80 mg total) by mouth daily at 6 PM.  . carvedilol (COREG) 6.25 MG tablet Take 1 tablet (  6.25 mg total) by mouth 2 (two) times daily with a meal.  . glucose blood test strip Test blood sugars 2 times daily.  [name] losartan (COZAAR) 50 MG tablet Take 25 mg by mouth daily.  . nitroGLYCERIN (NITROSTAT) 0.4 MG SL tablet Place 1 tablet (0.4 mg total) under the tongue every 5 (five) minutes as needed for chest pain.  . Semaglutide,0.25 or 0.5MG /DOS, (OZEMPIC, 0.25 OR 0.5 MG/DOSE,) 2 MG/1.5ML SOPN Inject 0.5 mg as directed once a week.  . ticagrelor (BRILINTA) 90 MG TABS tablet Take 1 tablet (90 mg total) by mouth 2 (two) times daily.  . [DISCONTINUED] carvedilol (COREG) 12.5 MG tablet Take 1 tablet (12.5 mg total) by mouth 2 (two) times  daily with a meal.  . [DISCONTINUED] isosorbide mononitrate (IMDUR) 30 MG 24 hr tablet Take 0.5 tablets (15 mg total) by mouth daily.     Allergies:   Codeine   Social History   Socioeconomic History  . Marital status: Divorced    Spouse name: Not on file  . Number of children: Not on file  . Years of education: Not on file  . Highest education level: Not on file  Occupational History  . Not on file  Tobacco Use  . Smoking status: Former Smoker    Packs/day: 0.25    Years: 10.00    Pack years: 2.50    Types: Cigarettes    Quit date: 07/01/2011    Years since quitting: 8.1  . Smokeless tobacco: Former Neurosurgeon    Quit date: 06/29/2011  Substance and Sexual Activity  . Alcohol use: Yes    Comment: weekly  . Drug use: No  . Sexual activity: Not on file  Other Topics Concern  . Not on file  Social History Narrative  . Not on file   Social Determinants of Health   Financial Resource Strain:   . Difficulty of Paying Living Expenses: Not on file  Food Insecurity:   . Worried About Programme researcher, broadcasting/film/video in the Last Year: Not on file  . Ran Out of Food in the Last Year: Not on file  Transportation Needs:   . Lack of Transportation (Medical): Not on file  . Lack of Transportation (Non-Medical): Not on file  Physical Activity:   . Days of Exercise per Week: Not on file  . Minutes of Exercise per Session: Not on file  Stress:   . Feeling of Stress : Not on file  Social Connections:   . Frequency of Communication with Friends and Family: Not on file  . Frequency of Social Gatherings with Friends and Family: Not on file  . Attends Religious Services: Not on file  . Active Member of Clubs or Organizations: Not on file  . Attends Banker Meetings: Not on file  . Marital Status: Not on file     Family History: The patient's family history includes CAD in his father and paternal grandfather; Diabetes in his father and paternal grandmother; Emphysema in his  mother.  ROS:   Please see the history of present illness.     All other systems reviewed and are negative.  EKGs/Labs/Other Studies Reviewed:    The following studies were reviewed today:   EKG:    Recent Labs: 06/28/2019: ALT 20 08/14/2019: BUN 14; Creatinine, Ser 0.76; Hemoglobin 16.2; Platelets 187; Potassium 3.9; Sodium 137  Recent Lipid Panel    Component Value Date/Time   CHOL 167 05/15/2019 0317   TRIG 414 (H) 05/15/2019 2542  HDL 32 (L) 05/15/2019 0317   CHOLHDL 5.2 05/15/2019 0317   VLDL UNABLE TO CALCULATE IF TRIGLYCERIDE OVER 400 mg/dL 60/63/0160 1093   LDLCALC UNABLE TO CALCULATE IF TRIGLYCERIDE OVER 400 mg/dL 23/55/7322 0254   LDLDIRECT 77.5 05/15/2019 0317    Physical Exam:    Physical Exam: Blood pressure 104/68, pulse 81, height 5\' 8"  (1.727 m), weight 186 lb (84.4 kg), SpO2 96 %.  GEN:  Well nourished, well developed in no acute distress HEENT: Normal NECK: No JVD; No carotid bruits LYMPHATICS: No lymphadenopathy CARDIAC: RRR , no murmurs, rubs, gallops RESPIRATORY:  Clear to auscultation without rales, wheezing or rhonchi  ABDOMEN: Soft, non-tender, non-distended MUSCULOSKELETAL:  No edema; No deformity  SKIN: Warm and dry NEUROLOGIC:  Alert and oriented x 3  ASSESSMENT:    1. Non-ST elevation (NSTEMI) myocardial infarction (HCC)   2. Essential hypertension    PLAN:    In order of problems listed above:  Coronary artery disease  Status post stenting of the circumflex artery.  He has a moderate stenosis in his LAD.  He has been having some episodes of pain but these are very atypical and only last for a second or so. I do not think that these are cardiac in origin.  Discontinue the isosorbide. He never went up on his dose of carvedilol but now his blood pressure is fairly low.    2.  Hypertension:    Blood pressures fairly well controlled.  3.  Hyperlipidemia: Lipids are better.  We will have him see an APP in 3 months.  Will check lipids  at that time.  He will continue with a good low fat low cholesterol diet. I will plan on seeing him in 6 months.  Medication Adjustments/Labs and Tests Ordered: Current medicines are reviewed at length with the patient today.  Concerns regarding medicines are outlined above.  Orders Placed This Encounter  Procedures  . Basic metabolic panel  . Lipid panel  . Hepatic function panel   Meds ordered this encounter  Medications  . carvedilol (COREG) 6.25 MG tablet    Sig: Take 1 tablet (6.25 mg total) by mouth 2 (two) times daily with a meal.    Patient Instructions  Your physician has recommended you make the following change in your medication:  STOP ISOSORBIDE CARVEDILOL6.25 MG TWICE DAILY Your physician recommends that you return for lab work in: FASTING LIPID LIVER AND BMET Your physician recommends that you schedule a follow-up appointment in:  3  MONTHS WITH JILL 6 MONTHS WITH DR    Signed, Elease Hashimoto, MD  08/22/2019 5:38 PM    [facility]

## 2019-08-28 ENCOUNTER — Ambulatory Visit: Payer: 59 | Admitting: Cardiovascular Disease

## 2019-08-31 ENCOUNTER — Telehealth: Payer: Self-pay | Admitting: Cardiovascular Disease

## 2019-08-31 NOTE — Telephone Encounter (Signed)
FMLA/disability form received from [employer] at [facility] bldg. Placing in box for Dr. Elease Hashimoto to review. 08/31/19 vlm

## 2019-09-03 ENCOUNTER — Telehealth: Payer: Self-pay | Admitting: Cardiovascular Disease

## 2019-09-03 NOTE — Telephone Encounter (Signed)
Signed form returned to [employer] at [facility] bldg. 09/03/19 vlm

## 2019-09-11 ENCOUNTER — Other Ambulatory Visit: Payer: Self-pay | Admitting: Family Medicine

## 2019-11-15 ENCOUNTER — Ambulatory Visit: Payer: 59 | Admitting: Cardiology

## 2019-11-15 ENCOUNTER — Other Ambulatory Visit: Payer: 59

## 2019-11-15 ENCOUNTER — Other Ambulatory Visit: Payer: Self-pay

## 2019-11-15 DIAGNOSIS — I10 Essential (primary) hypertension: Secondary | ICD-10-CM

## 2019-11-15 DIAGNOSIS — I214 Non-ST elevation (NSTEMI) myocardial infarction: Secondary | ICD-10-CM

## 2019-11-15 NOTE — Progress Notes (Signed)
Cardiology Office Note    Date:  11/27/2019   ID:  Kyle Barber, DOB 08/22/1960, MRN [id number]  PCP:  Sharlene Dory, DO  Cardiologist:  Dr. Elease Hashimoto, MD   No chief complaint on file.   History of Present Illness: Kyle Barber is a 59 y.o. male who presents for follow up, seen for Dr. Elease Hashimoto.   Mr. Kyle Barber has a history of CAD s/p NSTEMI 04/2019, DM2, tobacco use, pancreatitis and hypertension.   He was admitted to the hospital 05/14/2019 with NSTEMI.  Initial symptoms were abdominal pain with radiation to his shoulder and down his left arm. His symptoms resolved with sublingual nitroglycerin.  HST levels were elevated at [facility] arrival therefore he was transferred to [facility] for urgent cardiac catheterization found to have a total occlusion of his LCx which was successfully stented.  He did have moderate mid LAD stenosis.  He was placed on DAPT with ASA as well as Brilinta without complication.  Unfortunately, he was seen in the ED on 08/14/2019 with chest pain described as a central, anterior pain that was  brief in duration which radiated across both the right and left sides of his chest only lasting a few seconds then would dissipate on its own however was occuring several times throughout the day. Troponin was found to be 8, not consistent with ACS.  Case discussed with hospital DOD, Dr. Duke Salvia who felt that given the atypical nature of his symptoms along with a reassuring EKG as well as normal troponin plan was for hospital discharge with close follow-up.  Per cath report, recommendations were to favor medical therapy for residual CAD unless recurrent anginal/ischemic symptoms.   He was seen by myself 08/16/2019 and continued to have centralized chest pain, noted to have residual LAD disease per LHC at 50% on cath.  Imdur was added to his regimen.  At close follow-up with Dr. Elease Hashimoto 09/2019 with symptom improvement.  BP was improved as well.  Chest  pain at that time not felt to be cardiac in origin.  Isosorbide discontinued secondary to headaches.  Plan was for close follow-up in 3 months.  Today Kyle Barber is doing very well. He has had no chest pain symptoms. Denies palpitations, PND, orthopnea, LE edema, dizziness or syncope. He recently was camping and playing music near [place] over [place] Day weekend. Does have some bruising on arms and legs from cleaning out his RV. Discussed dropping Brilinta in 04/2020 therefore will make appointment for follow up close to that time.      Past Medical History:  Diagnosis Date  . Asthma 02/27/2016  . Diabetes mellitus without complication (HCC)   . Hypertension   . Myocardial infarct (HCC)   . Pancreatitis   . Premature ventricular contraction     Past Surgical History:  Procedure Laterality Date  . APPENDECTOMY    . CORONARY STENT INTERVENTION N/A 05/14/2019   Procedure: CORONARY STENT INTERVENTION;  Surgeon: Tonny Bollman, MD;  Location: [facility];  Service: Cardiovascular;  Laterality: N/A;  . HERNIA REPAIR    . LEFT HEART CATH AND CORONARY ANGIOGRAPHY N/A 05/14/2019   Procedure: LEFT HEART CATH AND CORONARY ANGIOGRAPHY;  Surgeon: Tonny Bollman, MD;  Location: [facility] INVASIVE CV LAB;  Service: Cardiovascular;  Laterality: N/A;     Current Outpatient Medications  Medication Sig Dispense Refill  . albuterol (VENTOLIN HFA) 108 (90 Base) MCG/ACT inhaler Inhale 1-2 puffs into the lungs every 6 (six)  hours as needed for wheezing or shortness of breath. 6.7 g 1  . aspirin 81 MG chewable tablet Chew 1 tablet (81 mg total) by mouth daily. 90 tablet 3  . atorvastatin (LIPITOR) 80 MG tablet Take 1 tablet (80 mg total) by mouth daily at 6 PM. 90 tablet 3  . carvedilol (COREG) 6.25 MG tablet TAKE 1 TABLET (6.25 MG TOTAL) BY MOUTH 2 (TWO) TIMES DAILY WITH A MEAL. 180 tablet 1  . glucose blood test strip Test blood sugars 2 times daily. 100 each 5  . losartan (COZAAR) 50 MG tablet Take 25  mg by mouth daily.    . nitroGLYCERIN (NITROSTAT) 0.4 MG SL tablet Place 1 tablet (0.4 mg total) under the tongue every 5 (five) minutes as needed for chest pain. 25 tablet 3  . Semaglutide,0.25 or 0.5MG /DOS, (OZEMPIC, 0.25 OR 0.5 MG/DOSE,) 2 MG/1.5ML SOPN Inject 0.5 mg as directed once a week. 4.5 mL 1  . ticagrelor (BRILINTA) 90 MG TABS tablet Take 1 tablet (90 mg total) by mouth 2 (two) times daily. 180 tablet 3   No current facility-administered medications for this visit.    Allergies:   Codeine    Social History:  The patient  reports that he quit smoking about 8 years ago. His smoking use included cigarettes. He has a 2.50 pack-year smoking history. He quit smokeless tobacco use about 8 years ago. He reports current alcohol use. He reports that he does not use drugs.   Family History:  The patient's family history includes CAD in his father and paternal grandfather; Diabetes in his father and paternal grandmother; Emphysema in his mother.    ROS:  Please see the history of present illness.   Otherwise, review of systems are positive for none. All other systems are reviewed and negative.    PHYSICAL EXAM: VS:  BP 134/80   Pulse 73   Ht 5\' 8"  (1.727 m)   Wt 187 lb (84.8 kg)   SpO2 97%   BMI 28.43 kg/m  , BMI Body mass index is 28.43 kg/m.   General: Well developed, well nourished, NAD Skin: Warm, dry, intact  Lungs:Clear to ausculation bilaterally. No wheezes, rales, or rhonchi. Breathing is unlabored. Cardiovascular: RRR with S1 S2. No murmurs, rubs, gallops, or LV heave appreciated. Extremities: No edema. Radial  pulses 2+ bilaterally Neuro: Alert and oriented. No focal deficits. No facial asymmetry. MAE spontaneously. Psych: Responds to questions appropriately with normal affect.    EKG:  EKG is not ordered today.   Recent Labs: 08/14/2019: Hemoglobin 16.2; Platelets 187 11/15/2019: ALT 34; BUN 16; Creatinine, Ser 0.97; Potassium 4.4; Sodium 143    Lipid Panel      Component Value Date/Time   CHOL 119 11/15/2019 1248   TRIG 161 (H) 11/15/2019 1248   HDL 40 11/15/2019 1248   CHOLHDL 3.0 11/15/2019 1248   CHOLHDL 5.2 05/15/2019 0317   VLDL UNABLE TO CALCULATE IF TRIGLYCERIDE OVER 400 mg/dL 05/15/2019 0317   LDLCALC 52 11/15/2019 1248   LDLDIRECT 77.5 05/15/2019 0317     Wt Readings from Last 3 Encounters:  11/27/19 187 lb (84.8 kg)  08/22/19 186 lb (84.4 kg)  08/16/19 182 lb (82.6 kg)     Other studies Reviewed: Additional studies/ records that were reviewed today include:   Echocardiogram 05/14/2019:  1. Left ventricular ejection fraction, by visual estimation, is 55 to  60%. The left ventricle has normal function. There is no left ventricular  hypertrophy.  2. Basal and mid  anterolateral wall and basal and mid inferolateral wall  are abnormal.  3. Definity contrast agent was given IV to delineate the left ventricular  endocardial borders.  4. The left ventricle demonstrates regional wall motion abnormalities.  5. Normal LV EF with mild hypokinesis of base to mid  inferior/inferolateral wall.  6. Global right ventricle has normal systolic function.The right  ventricular size is normal. No increase in right ventricular wall  thickness.  7. Left atrial size was normal.  8. Right atrial size was normal.  9. Trivial pericardial effusion is present.  10. The mitral valve is normal in structure. No evidence of mitral valve  regurgitation. No evidence of mitral stenosis.  11. The tricuspid valve is normal in structure. Tricuspid valve  regurgitation is not demonstrated.  12. The aortic valve is tricuspid. Aortic valve regurgitation is not  visualized. No evidence of aortic valve sclerosis or stenosis.  13. The pulmonic valve was grossly normal. Pulmonic valve regurgitation is  not visualized.  14. TR signal is inadequate for assessing pulmonary artery systolic  pressure.  15. The inferior vena cava is normal in size with  greater than 50%  respiratory variability, suggesting right atrial pressure of 3 mmHg.    LHC 05/14/2019:  1. Acute total occlusion of the left circumflex, treated successfully with stenting (3.0x18 mm Resolute Onyx DES) 2. Moderate mid-LAD stenosis 3. Patent RCA (dominant vessel)  Recommend: DAPT with ASA and ticagrelor x 12 months. Favor medical therapy for residual CAD unless recurrent angina/ischemic symptoms.   ASSESSMENT AND PLAN:  1.  CAD s/p NSTEMI with PCI/DES to LCx and known residual disease in the mid LAD: -Continue DAPT with ASA, Brilinta>> reports compliance>>plan to d/c Brilinta 04/2019 -Continue carvedilol 12.5 p.o. twice daily as increased by his PCP, losartan 50 -Denies recurrent angina   2.  Hypertension: -Stable, 134/80 -Continue carvedilol to 6.25 mg p.o. twice daily  3.  HLD/hypertriglyceridemia: -Last LDL, 52 >>at goal of <70  -Last triglycerides noted to be significantly elevated at 414>>now to 161 -Continue high intensity atorvastatin   Current medicines are reviewed at length with the patient today.  The patient does not have concerns regarding medicines.  The following changes have been made:  no change  Labs/ tests ordered today include: None  No orders of the defined types were placed in this encounter.   Disposition:   FU with Dr. Elease Hashimoto in 5 months  Signed, Georgie Chard, NP  11/27/2019 4:26 PM    [facility] [address] [place], [place], [place]  [address] Phone: [phone]; Fax: [phone]

## 2019-11-16 LAB — BASIC METABOLIC PANEL
BUN/Creatinine Ratio: 16 (ref 10–24)
BUN: 16 mg/dL (ref 8–27)
CO2: 24 mmol/L (ref 20–29)
Calcium: 9.5 mg/dL (ref 8.6–10.2)
Chloride: 104 mmol/L (ref 96–106)
Creatinine, Ser: 0.97 mg/dL (ref 0.76–1.27)
GFR calc Af Amer: 98 mL/min/{1.73_m2} (ref >59)
GFR calc non Af Amer: 84 mL/min/{1.73_m2} (ref >59)
Glucose: 126 mg/dL — ABNORMAL HIGH (ref 65–99)
Potassium: 4.4 mmol/L (ref 3.5–5.2)
Sodium: 143 mmol/L (ref 134–144)

## 2019-11-16 LAB — LIPID PANEL
Chol/HDL Ratio: 3 ratio (ref 0.0–5.0)
Cholesterol, Total: 119 mg/dL (ref 100–199)
HDL: 40 mg/dL (ref >39)
LDL Chol Calc (NIH): 52 mg/dL (ref 0–99)
Triglycerides: 161 mg/dL — ABNORMAL HIGH (ref 0–149)
VLDL Cholesterol Cal: 27 mg/dL (ref 5–40)

## 2019-11-16 LAB — HEPATIC FUNCTION PANEL
ALT: 34 IU/L (ref 0–44)
AST: 23 IU/L (ref 0–40)
Albumin: 4.8 g/dL (ref 3.8–4.9)
Alkaline Phosphatase: 56 IU/L (ref 48–121)
Bilirubin Total: 1.5 mg/dL — ABNORMAL HIGH (ref 0.0–1.2)
Bilirubin, Direct: 0.35 mg/dL (ref 0.00–0.40)
Total Protein: 6.7 g/dL (ref 6.0–8.5)

## 2019-11-27 ENCOUNTER — Encounter: Payer: Self-pay | Admitting: Cardiology

## 2019-11-27 ENCOUNTER — Other Ambulatory Visit: Payer: Self-pay

## 2019-11-27 ENCOUNTER — Ambulatory Visit: Payer: 59 | Admitting: Cardiology

## 2019-11-27 VITALS — BP 134/80 | HR 73 | Ht 68.0 in | Wt 187.0 lb

## 2019-11-27 DIAGNOSIS — I214 Non-ST elevation (NSTEMI) myocardial infarction: Secondary | ICD-10-CM | POA: Diagnosis not present

## 2019-11-27 DIAGNOSIS — I10 Essential (primary) hypertension: Secondary | ICD-10-CM | POA: Diagnosis not present

## 2019-11-27 DIAGNOSIS — E782 Mixed hyperlipidemia: Secondary | ICD-10-CM

## 2019-11-27 NOTE — Patient Instructions (Signed)
Medication Instructions:   Your physician recommends that you continue on your current medications as directed. Please refer to the Current Medication list given to you today.  *If you need a refill on your cardiac medications before your next appointment, please call your pharmacy*  Lab Work:  None ordered today  Testing/Procedures:  None ordered today  Follow-Up:  On 05/01/20 at 4:00PM with Kristeen Miss, MD

## 2019-12-19 ENCOUNTER — Telehealth: Payer: Self-pay | Admitting: Family Medicine

## 2019-12-19 DIAGNOSIS — E669 Obesity, unspecified: Secondary | ICD-10-CM

## 2019-12-19 MED ORDER — OZEMPIC (0.25 OR 0.5 MG/DOSE) 2 MG/1.5ML ~~LOC~~ SOPN: 0.5000 mg | PEN_INJECTOR | SUBCUTANEOUS | 1 refills | Status: DC

## 2019-12-19 NOTE — Telephone Encounter (Signed)
Medication:Semaglutide,0.25 or 0.5MG /DOS, (OZEMPIC, 0.25 OR 0.5 MG/DOSE,) 2 MG/1.5ML SOPN  Has the patient contacted their pharmacy? No. (If no, request that the patient contact the pharmacy for the refill.) (If yes, when and what did the pharmacy advise?)  Preferred Pharmacy (with phone number or street name):   [id number] [place],  - [address]  [address] [address]  Phone:  [phone] Fax:  [phone]  Agent: Please be advised that RX refills may take up to 3 business days. We ask that you follow-up with your pharmacy.

## 2019-12-19 NOTE — Telephone Encounter (Signed)
Refill done.  

## 2020-02-20 ENCOUNTER — Telehealth: Payer: Self-pay | Admitting: Family Medicine

## 2020-02-20 MED ORDER — ALBUTEROL SULFATE HFA 108 (90 BASE) MCG/ACT IN AERS: 1.0000 | INHALATION_SPRAY | Freq: Four times a day (QID) | RESPIRATORY_TRACT | 1 refills | Status: DC | PRN

## 2020-02-20 NOTE — Telephone Encounter (Signed)
New message:   1.Medication Requested: albuterol (VENTOLIN HFA) 108 (90 Base) MCG/ACT inhaler 2. Pharmacy (Name, Street, [place]): [phone] [place], [place] - [address] 3. On Med List: Yes  4. Last Visit with PCP:   5. Next visit date with PCP:   Agent: Please be advised that RX refills may take up to 3 business days. We ask that you follow-up with your pharmacy.

## 2020-05-01 ENCOUNTER — Encounter: Payer: Self-pay | Admitting: Cardiovascular Disease

## 2020-05-01 ENCOUNTER — Other Ambulatory Visit: Payer: Self-pay

## 2020-05-01 ENCOUNTER — Ambulatory Visit: Payer: 59 | Admitting: Cardiovascular Disease

## 2020-05-01 VITALS — BP 110/60 | HR 60 | Ht 68.0 in | Wt 194.8 lb

## 2020-05-01 DIAGNOSIS — I214 Non-ST elevation (NSTEMI) myocardial infarction: Secondary | ICD-10-CM | POA: Diagnosis not present

## 2020-05-01 DIAGNOSIS — E782 Mixed hyperlipidemia: Secondary | ICD-10-CM

## 2020-05-01 DIAGNOSIS — I10 Essential (primary) hypertension: Secondary | ICD-10-CM

## 2020-05-01 MED ORDER — CLOPIDOGREL BISULFATE 75 MG PO TABS: 75.0000 mg | ORAL_TABLET | Freq: Every day | ORAL | 3 refills | Status: DC

## 2020-05-01 NOTE — Progress Notes (Signed)
Cardiology Office Note:    Date:  05/01/2020   ID:  Kyle Barber, DOB 01/06/1961, MRN [id number]  PCP:  Kyle Barber, Kyle Paul, DO  Cardiologist:  Kyle MissPhilip Odaly Peri, MD  Electrophysiologist:  None   Referring MD: Kyle Barber, Kyle Barber*   Chief Complaint  Patient presents with  . Coronary Artery Disease  . Hyperlipidemia    Previous notes:    Kyle Barber is a 59 y.o. male with a hx of DM2,  cigarette smoking, hypertension who was admitted to the hospital on May 14, 2019  with non-ST segment elevation myocardial infarction.  The patient's symptoms were abdominal pain with radiation to the shoulder and down his left arm.  The symptoms resolved with sublingual nitroglycerin.  He had elevated troponin levels and was transferred from the [facility] emergency room [facility] where he had an urgent heart catheterization.  At heart catheterization he was found to have a total occlusion of his left circumflex artery which was successfully stented with a 3.0 x 18 mm resolute Onyx DES.  He has moderate mid LAD stenosis.  Recovering well.  No further CP .   Has some fatigue .   Has rare sharp pains. Has stopped smoking  Works at [employer]. 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  He emailed me a copy of his bands CD recently.  His band  has a really nice sound.  Last Monday, started have cp. Lasted several seconds. BP was elevated  160 / 100   He started having some recent episodes of chest discomfort.  Carvedilol was increased to 12.5 mg twice a day and isosorbide 15 mg a day was added. Pains are not related to exercise,  Eating or drinkg .  Thinks the pain  Was better when he would lie on his back   The fleeting cp has generally improved.   Having fewer episode of these cp BP is much better  These pains  are not similar to his previous NSTEMI symptom ( arm burning , episgastric pain radiating to  B shoulders )   His diet is greatly improved since November. He has a history of extremely high levels of triglycerides.  Nov. 4, 2021:  Kyle Barber is seen today for follow up of his CAD, STEMI He had stenting of his LCx. Has hx of hyperlipidemia  Doing well with the band.       Past Medical History:  Diagnosis Date  . Asthma 02/27/2016  . Diabetes mellitus without complication (HCC)   . Hypertension   . Myocardial infarct (HCC)   . Pancreatitis   . Premature ventricular contraction     Past Surgical History:  Procedure Laterality Date  . APPENDECTOMY    . CORONARY STENT INTERVENTION N/A 05/14/2019   Procedure: CORONARY STENT INTERVENTION;  Surgeon: Tonny Bollmanooper, Michael, MD;  [facility];  Service: Cardiovascular;  Laterality: N/A;  . HERNIA REPAIR    . LEFT HEART CATH AND CORONARY ANGIOGRAPHY N/A 05/14/2019   Procedure: LEFT HEART CATH AND CORONARY ANGIOGRAPHY;  Surgeon: Tonny Bollmanooper, Michael, MD;  [facility];  Service: Cardiovascular;  Laterality: N/A;    Current Medications: Current Meds  Medication Sig  . albuterol (VENTOLIN HFA) 108 (90 Base) MCG/ACT inhaler Inhale 1-2 puffs into the lungs every 6 (six) hours as needed for wheezing or shortness of breath.  Kyle Barber. atorvastatin (LIPITOR) 80 MG tablet  Take 1 tablet (80 mg total) by mouth daily at 6 PM.  . carvedilol (COREG) 6.25 MG tablet TAKE 1 TABLET (6.25 MG TOTAL) BY MOUTH 2 (TWO) TIMES DAILY WITH A MEAL.  Kyle Barber glucose blood test strip Test blood sugars 2 times daily.  Kyle Barber losartan (COZAAR) 50 MG tablet Take 25 mg by mouth daily.  . nitroGLYCERIN (NITROSTAT) 0.4 MG SL tablet Place 1 tablet (0.4 mg total) under the tongue every 5 (five) minutes as needed for chest pain.  . Semaglutide,0.25 or 0.5MG /DOS, (OZEMPIC, 0.25 OR 0.5 MG/DOSE,) 2 MG/1.5ML SOPN Inject 0.375 mLs (0.5 mg total) as directed once a week.  . [DISCONTINUED] aspirin 81 MG chewable tablet Chew 1 tablet (81 mg total) by mouth daily.  .  [DISCONTINUED] ticagrelor (BRILINTA) 90 MG TABS tablet Take 1 tablet (90 mg total) by mouth 2 (two) times daily.     Allergies:   Codeine   Social History   Socioeconomic History  . Marital status: Divorced    Spouse name: Not on file  . Number of children: Not on file  . Years of education: Not on file  . Highest education level: Not on file  Occupational History  . Not on file  Tobacco Use  . Smoking status: Former Smoker    Packs/day: 0.25    Years: 10.00    Pack years: 2.50    Types: Cigarettes    Quit date: 07/01/2011    Years since quitting: 8.8  . Smokeless tobacco: Former Neurosurgeon    Quit date: 06/29/2011  Vaping Use  . Vaping Use: Never used  Substance and Sexual Activity  . Alcohol use: Yes    Comment: weekly  . Drug use: No  . Sexual activity: Not on file  Other Topics Concern  . Not on file  Social History Narrative  . Not on file   Social Determinants of Health   Financial Resource Strain:   . Difficulty of Paying Living Expenses: Not on file  Food Insecurity:   . Worried About Programme researcher, broadcasting/film/video in the Last Year: Not on file  . Ran Out of Food in the Last Year: Not on file  Transportation Needs:   . Lack of Transportation (Medical): Not on file  . Lack of Transportation (Non-Medical): Not on file  Physical Activity:   . Days of Exercise per Week: Not on file  . Minutes of Exercise per Session: Not on file  Stress:   . Feeling of Stress : Not on file  Social Connections:   . Frequency of Communication with Friends and Family: Not on file  . Frequency of Social Gatherings with Friends and Family: Not on file  . Attends Religious Services: Not on file  . Active Member of Clubs or Organizations: Not on file  . Attends Banker Meetings: Not on file  . Marital Status: Not on file     Family History: The patient's family history includes CAD in his father and paternal grandfather; Diabetes in his father and paternal grandmother; Emphysema in  his mother.  ROS:   Please see the history of present illness.     All other systems reviewed and are negative.  EKGs/Labs/Other Studies Reviewed:    The following studies were reviewed today:   EKG:    Recent Labs: 08/14/2019: Hemoglobin 16.2; Platelets 187 11/15/2019: ALT 34; BUN 16; Creatinine, Ser 0.97; Potassium 4.4; Sodium 143  Recent Lipid Panel    Component Value Date/Time   CHOL 119 11/15/2019  1248   TRIG 161 (H) 11/15/2019 1248   HDL 40 11/15/2019 1248   CHOLHDL 3.0 11/15/2019 1248   CHOLHDL 5.2 05/15/2019 0317   VLDL UNABLE TO CALCULATE IF TRIGLYCERIDE OVER 400 mg/dL 76/72/0947 0962   LDLCALC 52 11/15/2019 1248   LDLDIRECT 77.5 05/15/2019 0317    Physical Exam:     Physical Exam: Blood pressure 110/60, pulse 60, height 5\' 8"  (1.727 m), weight 194 lb 12.8 oz (88.4 kg), SpO2 97 %.  GEN:  Well nourished, well developed in no acute distress HEENT: Normal NECK: No JVD; No carotid bruits LYMPHATICS: No lymphadenopathy CARDIAC: RRR   RESPIRATORY:  Clear to auscultation without rales, wheezing or rhonchi  ABDOMEN: Soft, non-tender, non-distended MUSCULOSKELETAL:  No edema; No deformity  SKIN: Warm and dry NEUROLOGIC:  Alert and oriented x 3   ASSESSMENT:    1. Non-ST elevation (NSTEMI) myocardial infarction (HCC)   2. Essential hypertension   3. Elevated triglycerides with high cholesterol    PLAN:      Coronary artery disease :  No angina .    Is been 1 year since his PCI.  We will discontinue Brilinta and aspirin.  Start Plavix 75 mg a day.  He will overlap the aspirin therapy by 1 week.  He will call if he has any recurrent problems.     2.  Hypertension:    Blood pressure is well controlled.  Continue current medications.   3.  Hyperlipidemia: .  Will check lipids, liver enzymes, basic metabolic profile.  Medication Adjustments/Labs and Tests Ordered: Current medicines are reviewed at length with the patient today.  Concerns regarding  medicines are outlined above.  Orders Placed This Encounter  Procedures  . Lipid Profile  . Basic Metabolic Panel (BMET)  . Hepatic function panel   Meds ordered this encounter  Medications  . clopidogrel (PLAVIX) 75 MG tablet    Sig: Take 1 tablet (75 mg total) by mouth daily.    Dispense:  90 tablet    Refill:  3    Patient Instructions  Medication Instructions:  Your physician has recommended you make the following change in your medication:  STOP Brilinta STOP Aspirin START Plavix (clopidogrel) 75 mg once daily  *If you need a refill on your cardiac medications before your next appointment, please call your pharmacy*   Lab Work: TODAY - cholesterol, liver panel, basic metabolic panel If you have labs (blood work) drawn today and your tests are completely normal, you will receive your results only by: [place] MyChart Message (if you have MyChart) OR . A paper copy in the mail If you have any lab test that is abnormal or we need to change your treatment, we will call you to review the results.   Testing/Procedures: None Ordered    Follow-Up: At [facility], you and your health needs are our priority.  As part of our continuing mission to provide you with exceptional heart care, we have created designated Provider Care Teams.  These Care Teams include your primary Cardiologist (physician) and Advanced Practice Providers (APPs -  Physician Assistants and Nurse Practitioners) who all work together to provide you with the care you need, when you need it.    Your next appointment:   1 year(s)  The format for your next appointment:   In Person  Provider:   You may see [facility], MD or one of the following Advanced Practice Providers on your designated Care Team:    Kyle Miss,  PA-C  Chelsea Aus, PA-C        Signed, Kyle Miss, MD  05/01/2020 8:08 PM    [facility]

## 2020-05-01 NOTE — Patient Instructions (Signed)
Medication Instructions:  Your physician has recommended you make the following change in your medication:  STOP Brilinta STOP Aspirin START Plavix (clopidogrel) 75 mg once daily  *If you need a refill on your cardiac medications before your next appointment, please call your pharmacy*   Lab Work: TODAY - cholesterol, liver panel, basic metabolic panel If you have labs (blood work) drawn today and your tests are completely normal, you will receive your results only by: [name] MyChart Message (if you have MyChart) OR . A paper copy in the mail If you have any lab test that is abnormal or we need to change your treatment, we will call you to review the results.   Testing/Procedures: None Ordered    Follow-Up: At [facility], you and your health needs are our priority.  As part of our continuing mission to provide you with exceptional heart care, we have created designated Provider Care Teams.  These Care Teams include your primary Cardiologist (physician) and Advanced Practice Providers (APPs -  Physician Assistants and Nurse Practitioners) who all work together to provide you with the care you need, when you need it.    Your next appointment:   1 year(s)  The format for your next appointment:   In Person  Provider:   You may see Kristeen Miss, MD or one of the following Advanced Practice Providers on your designated Care Team:    Tereso Newcomer, PA-C  [place], [place]

## 2020-05-02 LAB — HEPATIC FUNCTION PANEL
ALT: 19 IU/L (ref 0–44)
AST: 14 IU/L (ref 0–40)
Albumin: 4.6 g/dL (ref 3.8–4.9)
Alkaline Phosphatase: 48 IU/L (ref 44–121)
Bilirubin Total: 0.9 mg/dL (ref 0.0–1.2)
Bilirubin, Direct: 0.24 mg/dL (ref 0.00–0.40)
Total Protein: 6.9 g/dL (ref 6.0–8.5)

## 2020-05-02 LAB — BASIC METABOLIC PANEL
BUN/Creatinine Ratio: 18 (ref 10–24)
BUN: 15 mg/dL (ref 8–27)
CO2: 26 mmol/L (ref 20–29)
Calcium: 9.2 mg/dL (ref 8.6–10.2)
Chloride: 104 mmol/L (ref 96–106)
Creatinine, Ser: 0.85 mg/dL (ref 0.76–1.27)
GFR calc Af Amer: 109 mL/min/{1.73_m2} (ref >59)
GFR calc non Af Amer: 95 mL/min/{1.73_m2} (ref >59)
Glucose: 109 mg/dL — ABNORMAL HIGH (ref 65–99)
Potassium: 4.5 mmol/L (ref 3.5–5.2)
Sodium: 145 mmol/L — ABNORMAL HIGH (ref 134–144)

## 2020-05-02 LAB — LIPID PANEL
Chol/HDL Ratio: 3 ratio (ref 0.0–5.0)
Cholesterol, Total: 152 mg/dL (ref 100–199)
HDL: 51 mg/dL (ref >39)
LDL Chol Calc (NIH): 71 mg/dL (ref 0–99)
Triglycerides: 177 mg/dL — ABNORMAL HIGH (ref 0–149)
VLDL Cholesterol Cal: 30 mg/dL (ref 5–40)

## 2020-05-07 ENCOUNTER — Other Ambulatory Visit: Payer: Self-pay | Admitting: Family Medicine

## 2020-05-16 ENCOUNTER — Telehealth: Payer: Self-pay | Admitting: Cardiovascular Disease

## 2020-05-16 NOTE — Telephone Encounter (Signed)
Patient returning call for lab results. 

## 2020-05-16 NOTE — Telephone Encounter (Signed)
Vesta Mixer, MD  05/02/2020 5:19 PM EDT     [place] are much better than 2-4 years ago  Still slightly elevated.  Liver enz stable Bmp is stable   The patient has been notified of the result and verbalized understanding.  All questions (if any) were answered. Loa Socks, LPN 84/05/8207 1:38 PM

## 2020-06-12 ENCOUNTER — Other Ambulatory Visit: Payer: 59

## 2020-06-12 DIAGNOSIS — Z20822 Contact with and (suspected) exposure to covid-19: Secondary | ICD-10-CM

## 2020-06-13 LAB — NOVEL CORONAVIRUS, NAA: SARS-CoV-2, NAA: NOT DETECTED

## 2020-06-13 LAB — SARS-COV-2, NAA 2 DAY TAT

## 2020-06-16 ENCOUNTER — Other Ambulatory Visit: Payer: 59

## 2020-07-04 ENCOUNTER — Telehealth: Payer: Self-pay | Admitting: Family Medicine

## 2020-07-04 DIAGNOSIS — E1169 Type 2 diabetes mellitus with other specified complication: Secondary | ICD-10-CM

## 2020-07-04 MED ORDER — OZEMPIC (0.25 OR 0.5 MG/DOSE) 2 MG/1.5ML ~~LOC~~ SOPN: 0.5000 mg | PEN_INJECTOR | SUBCUTANEOUS | 1 refills | Status: DC

## 2020-07-04 NOTE — Telephone Encounter (Signed)
Medication: Semaglutide,0.25 or 0.5MG /DOS, (OZEMPIC, 0.25 OR 0.5 MG/DOSE,) 2 MG/1.5ML SOPN [[id number]]    Has the patient contacted their pharmacy? No. (If no, request that the patient contact the pharmacy for the refill.) (If yes, when and what did the pharmacy advise?)  Preferred Pharmacy (with phone number or street name):  [id number] - [place], [place] - [address]  [address] [place] [address]  Phone:  [phone] Fax:  [phone]  DEA #:  [id number]  DAW Reason: --     Agent: Please be advised that RX refills may take up to 3 business days. We ask that you follow-up with your pharmacy.

## 2020-07-07 ENCOUNTER — Encounter: Payer: Self-pay | Admitting: Family Medicine

## 2020-07-07 ENCOUNTER — Other Ambulatory Visit: Payer: Self-pay

## 2020-07-07 ENCOUNTER — Telehealth (INDEPENDENT_AMBULATORY_CARE_PROVIDER_SITE_OTHER): Payer: 59 | Admitting: Family Medicine

## 2020-07-07 VITALS — BP 150/90 | HR 75 | Temp 97.8°F

## 2020-07-07 DIAGNOSIS — E1169 Type 2 diabetes mellitus with other specified complication: Secondary | ICD-10-CM | POA: Diagnosis not present

## 2020-07-07 DIAGNOSIS — Z1211 Encounter for screening for malignant neoplasm of colon: Secondary | ICD-10-CM | POA: Diagnosis not present

## 2020-07-07 DIAGNOSIS — E669 Obesity, unspecified: Secondary | ICD-10-CM

## 2020-07-07 DIAGNOSIS — I10 Essential (primary) hypertension: Secondary | ICD-10-CM | POA: Diagnosis not present

## 2020-07-07 MED ORDER — ATORVASTATIN CALCIUM 80 MG PO TABS: 80.0000 mg | ORAL_TABLET | Freq: Every day | ORAL | 3 refills | Status: DC

## 2020-07-07 NOTE — Progress Notes (Signed)
Subjective:   Chief Complaint  Patient presents with  . Medication Refill    Kyle Barber is a 60 y.o. male here for follow-up of diabetes.  Due to COVID-19 pandemic, we are interacting via web portal for an electronic face-to-face visit. I verified patient's ID using 2 identifiers. Patient agreed to proceed with visit via this method. Patient is at home, I am at office. Patient and I are present for visit.  Kyle Barber does not routinely monitor his sugars.  Patient does not require insulin.   Medications include: Ozempic Diet is healthy overall.  Exercise: none  Hypertension Patient presents for hypertension follow up. He does not routinely monitor home blood pressures. He is compliant with medications- losartan 25 mg/d, Coreg 12.5 mg bid. Patient has these side effects of medication: none Diet/exercise.  No CP or SOB.   Past Medical History:  Diagnosis Date  . Asthma 02/27/2016  . Diabetes mellitus without complication (HCC)   . Hypertension   . Myocardial infarct (HCC)   . Pancreatitis   . Premature ventricular contraction      Related testing: Retinal exam: Done Pneumovax: done  Objective:  BP (!) 150/90 (BP Location: Left Arm, Patient Position: Sitting, Cuff Size: Normal)   Pulse 75   Temp 97.8 F (36.6 C) (Oral)  No conversational dyspnea Age appropriate judgment and insight Nml affect and mood  Assessment:   Diabetes mellitus type 2 in obese (HCC) - Plan: Hemoglobin A1c, Microalbumin / creatinine urine ratio  Essential hypertension  Screen for colon cancer - Plan: Ambulatory referral to Gastroenterology   Plan:   1. Cont Ozempic 0.5 mg weekly. Counseled on diet and exercise. Come in for labs at convenience. Due for eye exam, he will call. I will perform foot exam when he is next in office.  2. Cont losartan 25 mg/d, Coreg 12.5 bid. Monitor BP's at home, if elevated he will sched nurse visit to compare monitor to our readings.  Pt was vaccinated for covid  and will send a message with dates.  F/u in 6 mo. The patient voiced understanding and agreement to the plan.  Jilda Roche [place], DO 07/07/20 4:08 PM

## 2020-08-01 IMAGING — US US ABDOMEN LIMITED
1 series · 14 of 25 positions shown · non-contrast
Comparison: August 02, 2011.

CLINICAL DATA: Abnormal liver function test.

EXAM:
ULTRASOUND ABDOMEN LIMITED RIGHT UPPER QUADRANT

[Series 1: us abdomen limited · 14 of 33 slices shown]
[im 1/33]
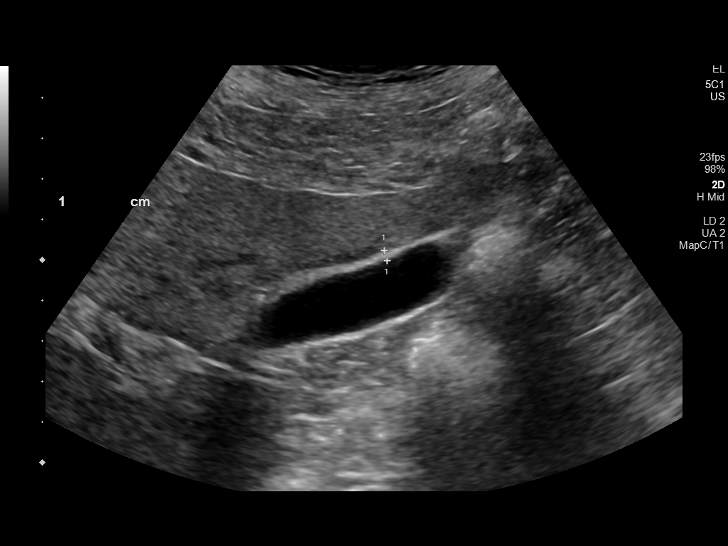
[im 3/33]
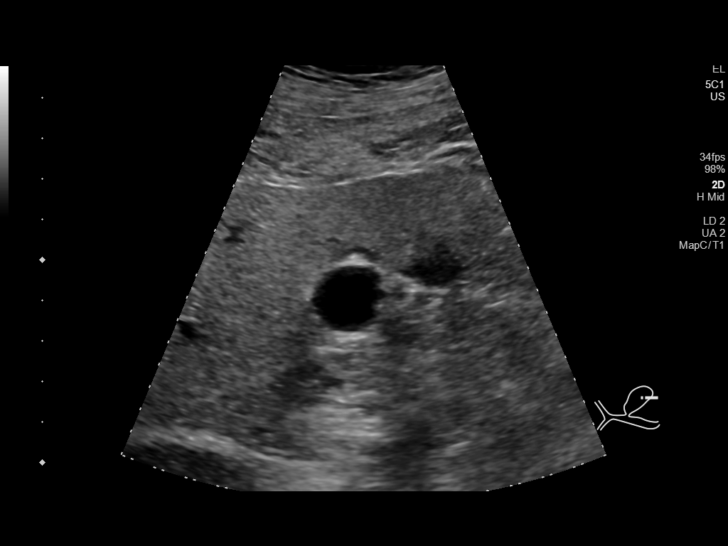
[im 6/33]
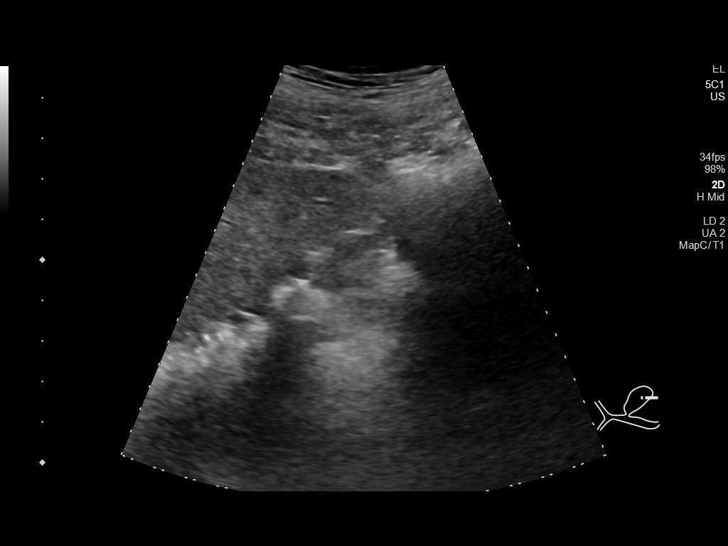
[im 9/33]
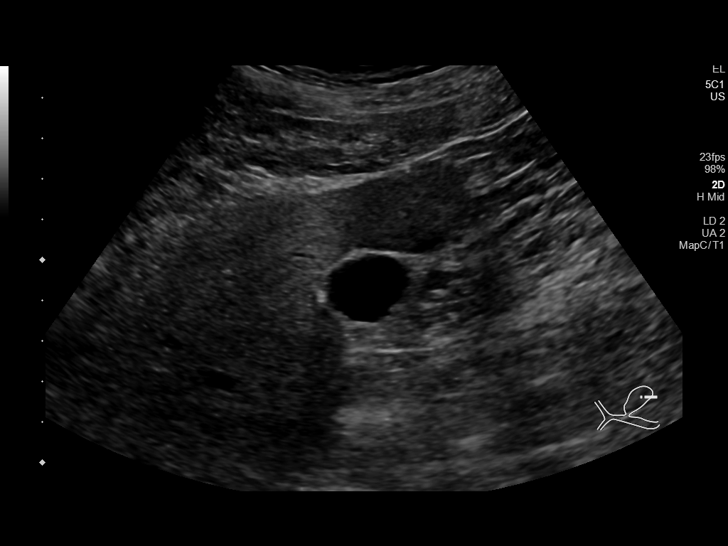
[im 11/33]
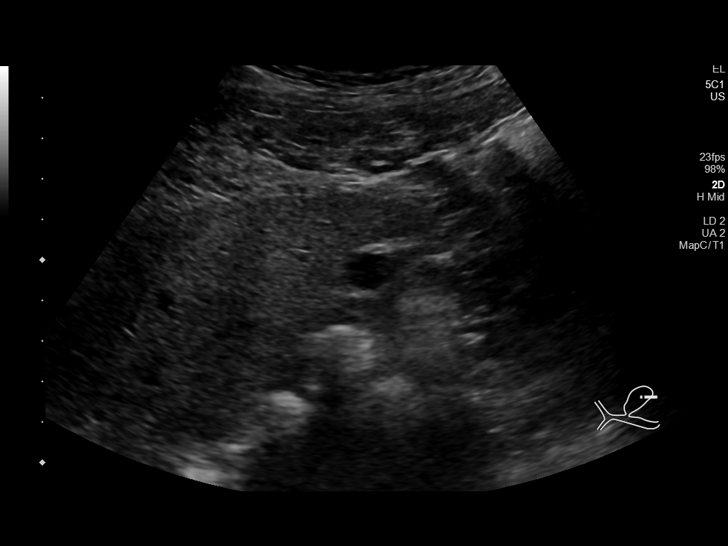
[im 13/33]
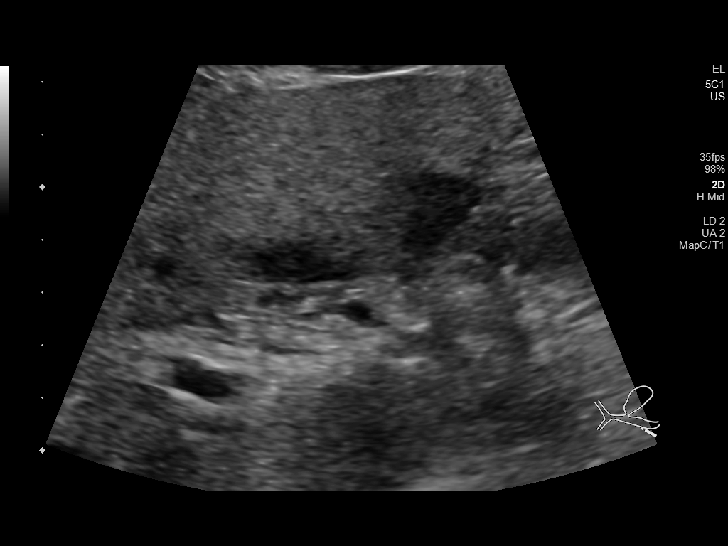
[im 15/33]
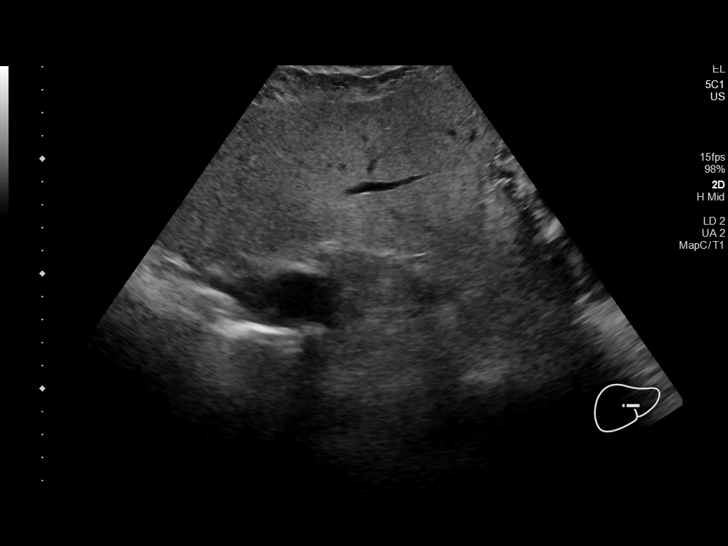
[im 18/33]
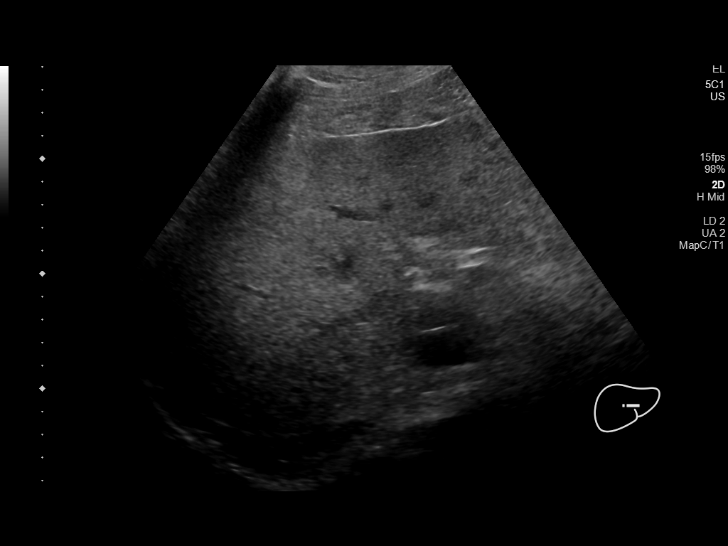
[im 21/33]
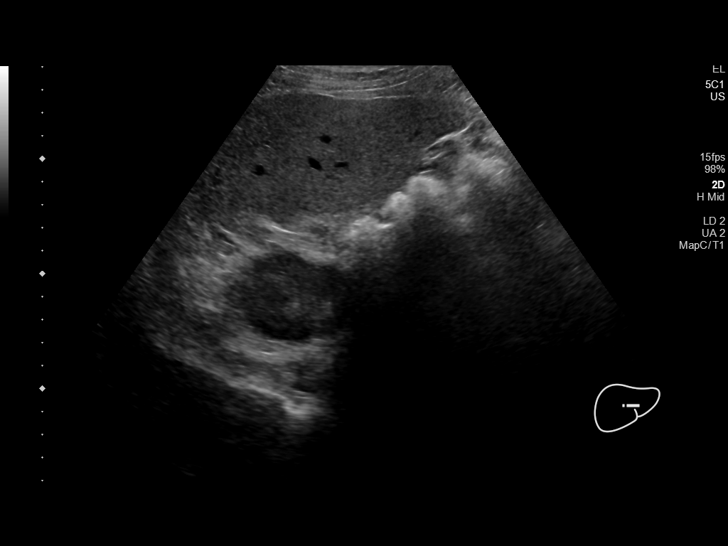
[im 22/33]
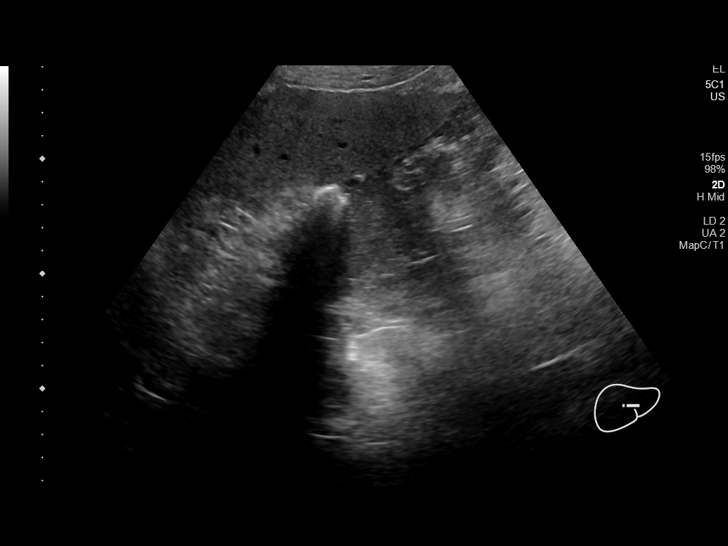
[im 25/33]
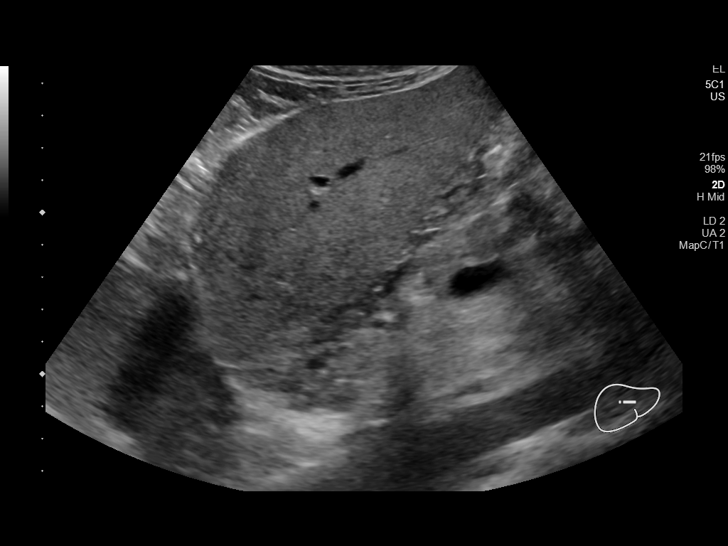
[im 27/33]
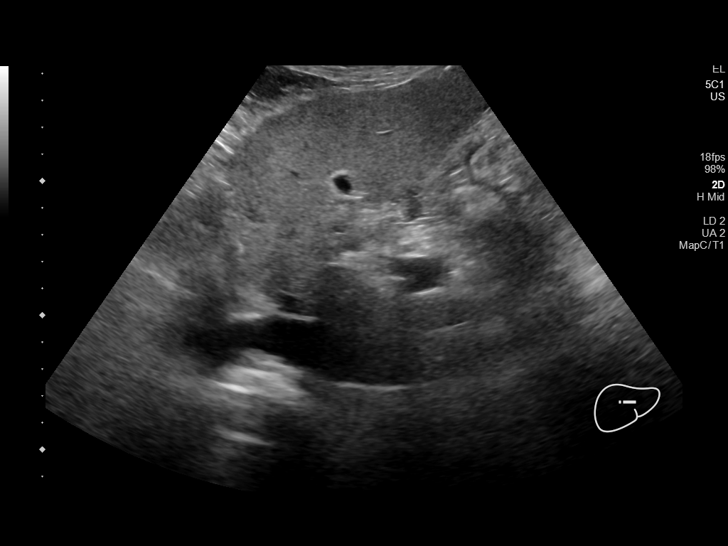
[im 30/33]
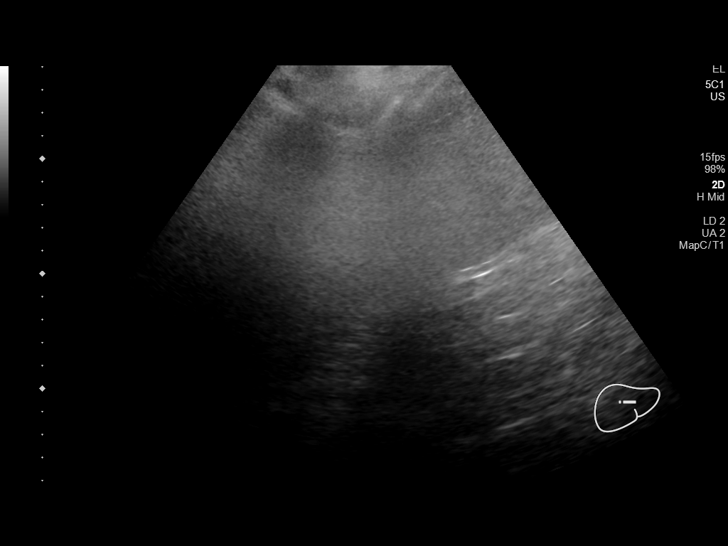
[im 33/33]
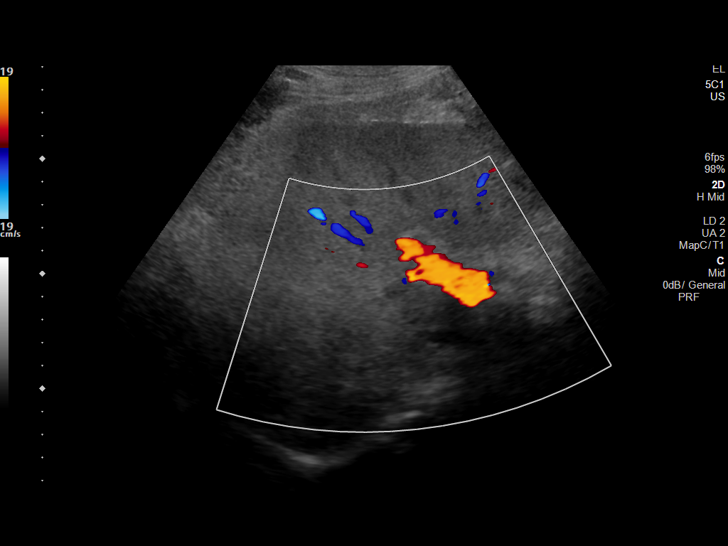

[14 of 25 positions shown; findings below may reference images not displayed]

FINDINGS: Gallbladder:

No gallstones or wall thickening visualized. No sonographic Murphy
sign noted by sonographer.

Common bile duct:

Diameter: 2 mm which is within normal limits.

Liver:

No focal lesion identified. Increased echogenicity of hepatic
parenchyma is noted suggesting hepatic steatosis. Portal vein is
patent on color Doppler imaging with normal direction of blood flow
towards the liver.

Other: None.
IMPRESSION: Probable hepatic steatosis. No other abnormality seen in the right
upper quadrant of the abdomen.

## 2020-08-22 ENCOUNTER — Other Ambulatory Visit (INDEPENDENT_AMBULATORY_CARE_PROVIDER_SITE_OTHER): Payer: 59

## 2020-08-22 ENCOUNTER — Other Ambulatory Visit: Payer: Self-pay

## 2020-08-22 DIAGNOSIS — E669 Obesity, unspecified: Secondary | ICD-10-CM

## 2020-08-22 DIAGNOSIS — E1169 Type 2 diabetes mellitus with other specified complication: Secondary | ICD-10-CM

## 2020-08-22 LAB — MICROALBUMIN / CREATININE URINE RATIO
Creatinine,U: 136.5 mg/dL
Microalb Creat Ratio: 0.7 mg/g (ref 0.0–30.0)
Microalb, Ur: 1 mg/dL (ref 0.0–1.9)

## 2020-08-22 LAB — HEMOGLOBIN A1C: Hgb A1c MFr Bld: 6.4 % (ref 4.6–6.5)

## 2020-11-01 IMAGING — CR DG CHEST 2V
2 series · 2 of 2 positions shown · non-contrast
Comparison: Chest radiograph 05/14/2019

CLINICAL DATA: Chest pain. Intermittent chest pain since yesterday.

EXAM:
CHEST - 2 VIEW

[w chest pa]
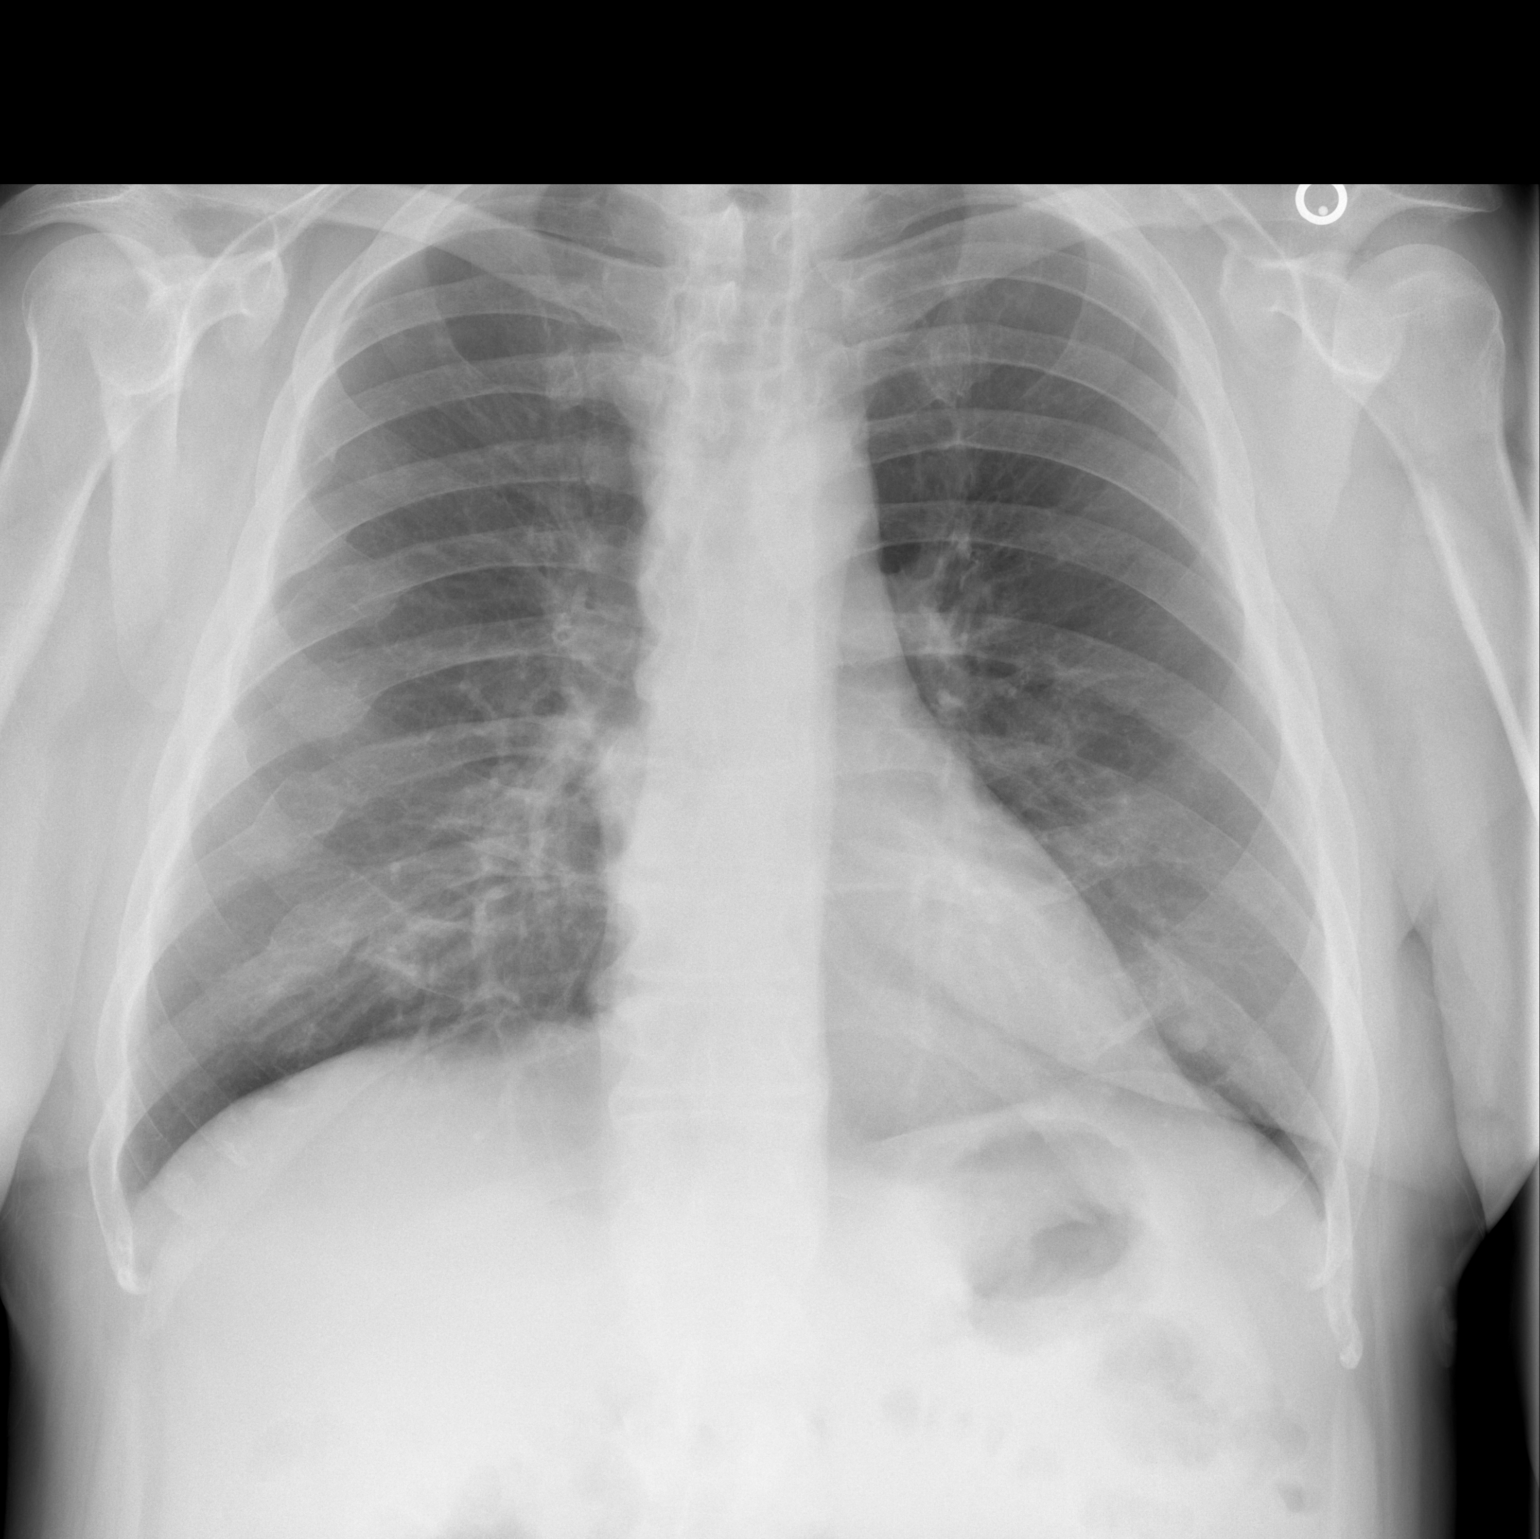

[w chest lat]
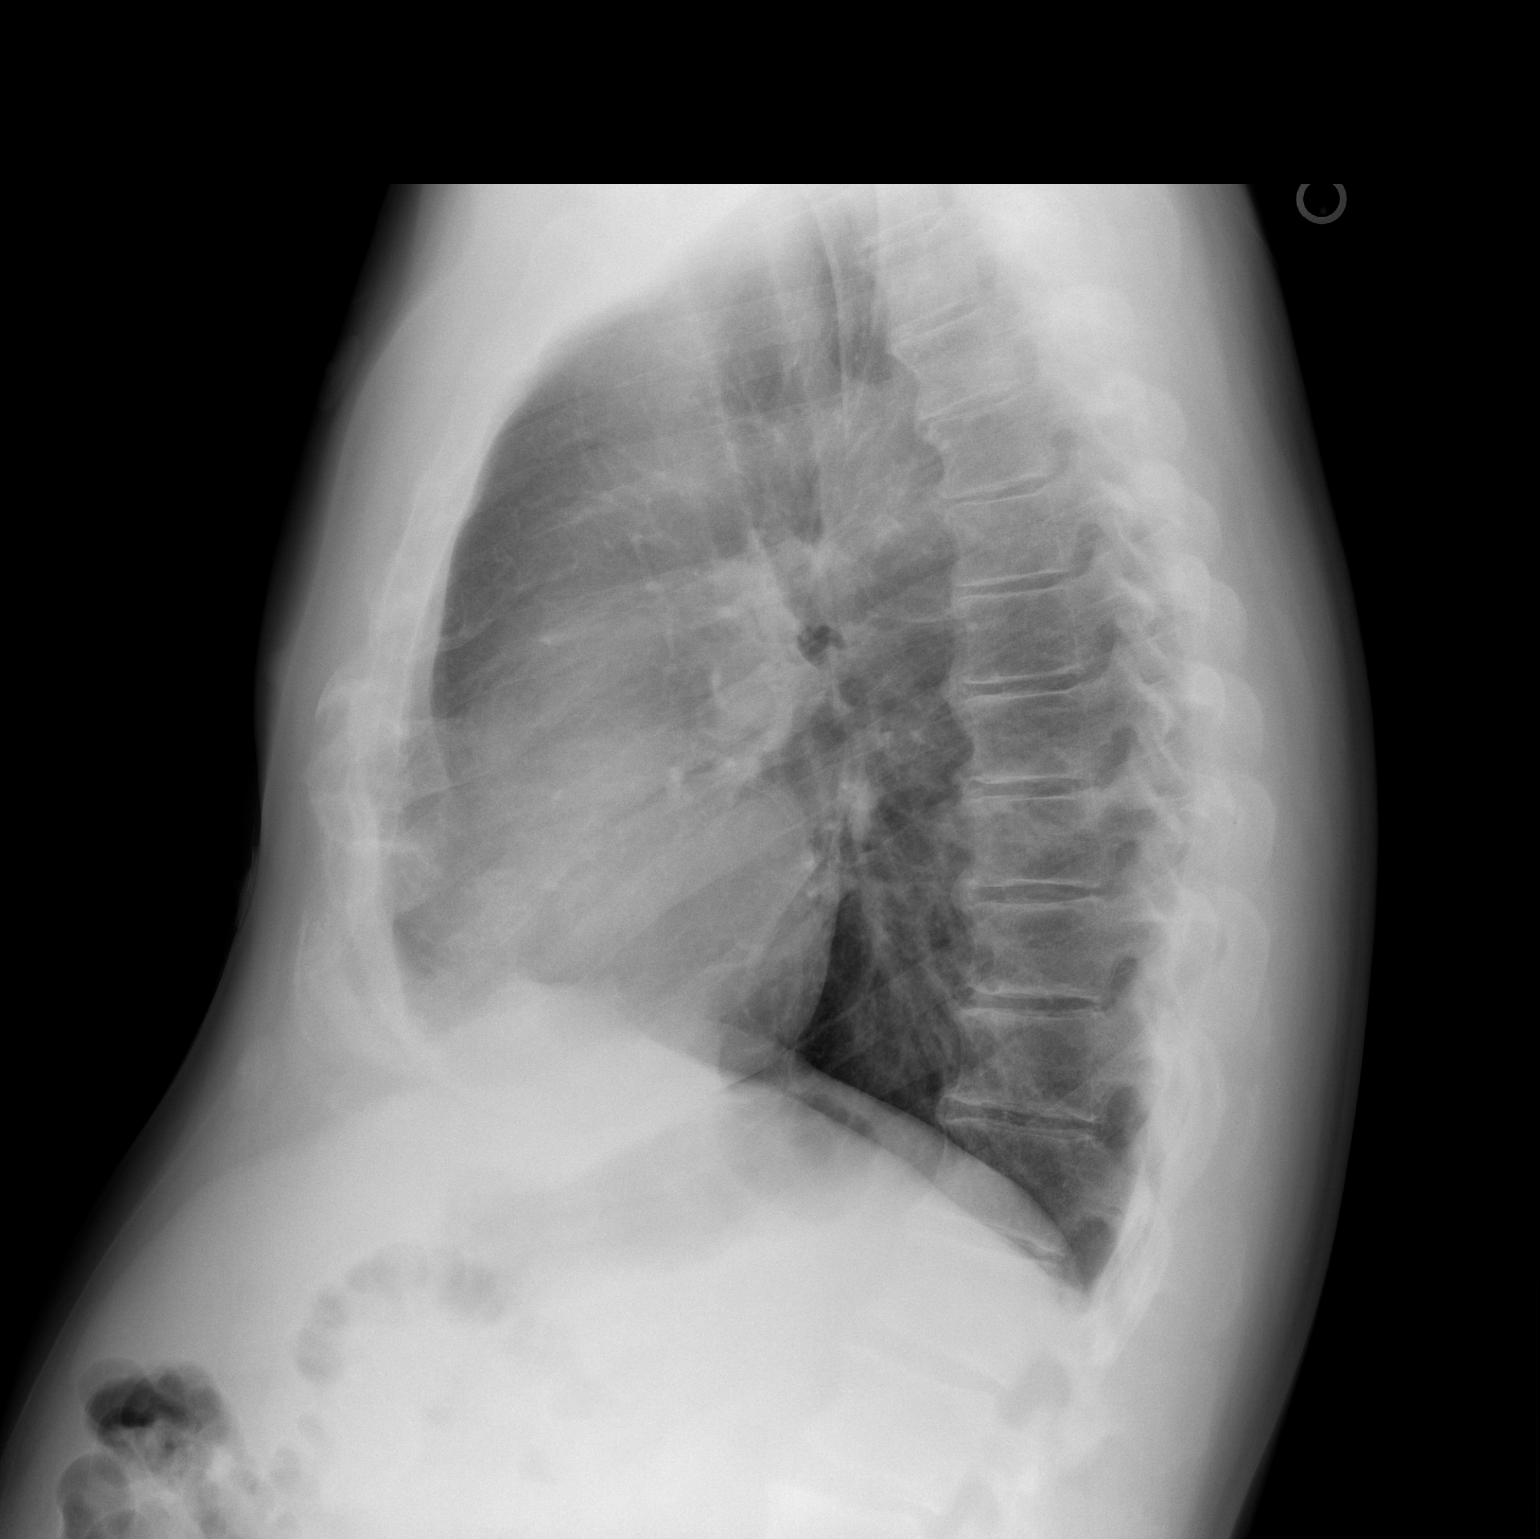

[2 of 2 positions shown; findings below may reference images not displayed]

FINDINGS: Heart size within normal limits. No evidence of airspace
consolidation within the lungs. Nipple shadows are noted. No
evidence of pleural effusion or pneumothorax. Multiple chronic
healed right-sided rib fractures. Thoracic spondylosis. No evidence
of acute bony abnormality.
IMPRESSION: No evidence of acute cardiopulmonary abnormality.

## 2020-11-06 ENCOUNTER — Other Ambulatory Visit: Payer: Self-pay | Admitting: Family Medicine

## 2020-12-05 ENCOUNTER — Ambulatory Visit: Payer: 59 | Admitting: Family

## 2020-12-09 ENCOUNTER — Ambulatory Visit: Payer: 59 | Admitting: Family Medicine

## 2021-01-20 ENCOUNTER — Encounter: Payer: 59 | Admitting: Family Medicine

## 2021-03-17 ENCOUNTER — Other Ambulatory Visit: Payer: Self-pay | Admitting: Family Medicine

## 2021-03-17 DIAGNOSIS — E669 Obesity, unspecified: Secondary | ICD-10-CM

## 2021-03-31 ENCOUNTER — Other Ambulatory Visit: Payer: Self-pay | Admitting: Family Medicine

## 2021-06-05 ENCOUNTER — Other Ambulatory Visit: Payer: Self-pay | Admitting: Family Medicine

## 2021-06-19 ENCOUNTER — Other Ambulatory Visit: Payer: Self-pay | Admitting: Family Medicine

## 2021-06-19 ENCOUNTER — Other Ambulatory Visit: Payer: Self-pay | Admitting: Cardiovascular Disease

## 2021-06-19 DIAGNOSIS — E1169 Type 2 diabetes mellitus with other specified complication: Secondary | ICD-10-CM

## 2021-07-06 ENCOUNTER — Other Ambulatory Visit: Payer: Self-pay | Admitting: Family Medicine

## 2021-07-19 ENCOUNTER — Other Ambulatory Visit: Payer: Self-pay | Admitting: Cardiovascular Disease

## 2021-08-01 ENCOUNTER — Other Ambulatory Visit: Payer: Self-pay | Admitting: Cardiovascular Disease

## 2021-08-17 ENCOUNTER — Other Ambulatory Visit: Payer: Self-pay | Admitting: Cardiovascular Disease

## 2021-10-26 ENCOUNTER — Telehealth: Payer: Self-pay | Admitting: Family Medicine

## 2021-10-26 MED ORDER — CARVEDILOL 6.25 MG PO TABS: 6.2500 mg | ORAL_TABLET | Freq: Two times a day (BID) | ORAL | 1 refills | Status: DC

## 2021-10-26 NOTE — Telephone Encounter (Signed)
Patient needs medication refilled ? ?Patient is going out of town tomorrow  ? ?Carvedilol [facility] [address]  ?

## 2021-10-26 NOTE — Telephone Encounter (Signed)
Sent in

## 2022-01-10 ENCOUNTER — Encounter: Payer: Self-pay | Admitting: Cardiovascular Disease

## 2022-01-10 NOTE — Progress Notes (Unsigned)
Cardiology Office Note:    Date:  01/12/2022   ID:  Kyle Barber, DOB [birth year], MRN [id number]  PCP:  Sharlene Dory, DO  Cardiologist:  Kristeen Miss, MD  Electrophysiologist:  None   Referring MD: Sharlene Dory*   Chief Complaint  Patient presents with   Coronary Artery Disease         Previous notes:    Kyle Barber is a 61 y.o. male with a hx of DM2,  cigarette smoking, hypertension who was admitted to the hospital on May 14, 2019  with non-ST segment elevation myocardial infarction.  The patient's symptoms were abdominal pain with radiation to the shoulder and down his left arm.  The symptoms resolved with sublingual nitroglycerin.  He had elevated troponin levels and was transferred from the [facility] emergency room to the [facility] where he had an urgent heart catheterization.  At heart catheterization he was found to have a total occlusion of his left circumflex artery which was successfully stented with a 3.0 x 18 mm resolute Onyx DES.  He has moderate mid LAD stenosis.  Recovering well.  No further CP .   Has some fatigue .   Has rare sharp pains. Has stopped smoking  Works at [employer]. 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  He emailed me a copy of his bands CD recently.  His band  has a really nice sound.  Last Monday, started have cp. Lasted several seconds. BP was elevated  160 / 100   He started having some recent episodes of chest discomfort.  Carvedilol was increased to 12.5 mg twice a day and isosorbide 15 mg a day was added. Pains are not related to exercise,  Eating or drinkg .  Thinks the pain  Was better when he would lie on his back   The fleeting cp has generally improved.   Having fewer episode of these cp BP is much better  These pains  are not similar to his previous NSTEMI symptom ( arm burning , episgastric pain radiating to B shoulders )    His diet is greatly improved since November. He has a history of extremely high levels of triglycerides.  Nov. 4, 2021:  Kyle Barber is seen today for follow up of his CAD, STEMI He had stenting of his LCx. Has hx of hyperlipidemia  Doing well with the band.    January 12, 2022 Kyle Barber is seen for follow up of his CAD  No angina  BP is slightly elevated  Is off the losartan  Does not take his atorvastatin all the time - skips doses , forgets   Past Medical History:  Diagnosis Date   Asthma 02/27/2016   Diabetes mellitus without complication (HCC)    Hypertension    Myocardial infarct (HCC)    Pancreatitis    Premature ventricular contraction     Past Surgical History:  Procedure Laterality Date   APPENDECTOMY     CORONARY STENT INTERVENTION N/A 05/14/2019   Procedure: CORONARY STENT INTERVENTION;  Surgeon: Tonny Bollman, MD;  Location: [facility] INVASIVE CV LAB;  Service: Cardiovascular;  Laterality: N/A;   HERNIA REPAIR     LEFT HEART CATH AND CORONARY ANGIOGRAPHY N/A 05/14/2019   Procedure: LEFT HEART CATH AND CORONARY ANGIOGRAPHY;  Surgeon: Tonny Bollman, MD;  Location: [facility] INVASIVE CV LAB;  Service: Cardiovascular;  Laterality: N/A;    Current Medications:  Current Meds  Medication Sig   albuterol (VENTOLIN HFA) 108 (90 Base) MCG/ACT inhaler INHALE 1 TO 2 PUFFS INTO THE LUNGS EVERY 6 HOURS AS NEEDED FOR WHEEZING OR SHORTNESS OF BREATH.   carvedilol (COREG) 6.25 MG tablet Take 1 tablet (6.25 mg total) by mouth 2 (two) times daily with a meal.   glucose blood test strip Test blood sugars 2 times daily.   losartan (COZAAR) 25 MG tablet Take 1 tablet (25 mg total) by mouth daily.   OZEMPIC, 0.25 OR 0.5 MG/DOSE, 2 MG/1.5ML SOPN INJECT 0.5 MG AS DIRECTED ONCE A WEEK.     Allergies:   Codeine   Social History   Socioeconomic History   Marital status: Divorced    Spouse name: Not on file   Number of children: Not on file   Years of education: Not on file   Highest education  level: Not on file  Occupational History   Not on file  Tobacco Use   Smoking status: Former    Packs/day: 0.25    Years: 10.00    Total pack years: 2.50    Types: Cigarettes    Quit date: 07/01/2011    Years since quitting: 10.5   Smokeless tobacco: Former    Quit date: 06/29/2011  Vaping Use   Vaping Use: Never used  Substance and Sexual Activity   Alcohol use: Yes    Comment: weekly   Drug use: No   Sexual activity: Not on file  Other Topics Concern   Not on file  Social History Narrative   Not on file   Social Determinants of Health   Financial Resource Strain: Not on file  Food Insecurity: Not on file  Transportation Needs: Not on file  Physical Activity: Not on file  Stress: Not on file  Social Connections: Not on file     Family History: The patient's family history includes CAD in his father and paternal grandfather; Diabetes in his father and paternal grandmother; Emphysema in his mother.  ROS:   Please see the history of present illness.     All other systems reviewed and are negative.  EKGs/Labs/Other Studies Reviewed:    The following studies were reviewed today:   EKG: January 12, 2022: Normal sinus rhythm with first-degree AV block.  Left normal EKG  Recent Labs: No results found for requested labs within last 365 days.  Recent Lipid Panel    Component Value Date/Time   CHOL 152 05/01/2020 1659   TRIG 177 (H) 05/01/2020 1659   HDL 51 05/01/2020 1659   CHOLHDL 3.0 05/01/2020 1659   CHOLHDL 5.2 05/15/2019 0317   VLDL UNABLE TO CALCULATE IF TRIGLYCERIDE OVER 400 mg/dL 26/71/2458 0998   LDLCALC 71 05/01/2020 1659   LDLDIRECT 77.5 05/15/2019 0317    Physical Exam:     Physical Exam: Blood pressure (!) 144/82, pulse 69, height 5\' 8"  (1.727 m), weight 186 lb 6.4 oz (84.6 kg), SpO2 96 %.  GEN:  Well nourished, well developed in no acute distress HEENT: Normal NECK: No JVD; No carotid bruits LYMPHATICS: No lymphadenopathy CARDIAC: RRR , no  murmurs, rubs, gallops RESPIRATORY:  Clear to auscultation without rales, wheezing or rhonchi  ABDOMEN: Soft, non-tender, non-distended MUSCULOSKELETAL:  No edema; No deformity  SKIN: Warm and dry NEUROLOGIC:  Alert and oriented x 3    ASSESSMENT:    1. Essential hypertension   2. Elevated triglycerides with high cholesterol     PLAN:      Coronary artery  disease :   Kyle Barber is doing well.  He is not having any episodes of chest pain.  Continue with current medications.     2.  Hypertension:      He ran of it is out of his losartan several months ago.  His blood pressure remains elevated.  I have encouraged him to work on restricting his salt.  We will restart losartan 25 mg a day.  3.  Hyperlipidemia: We will restart his atorvastatin 80 mg a day.  We will check lipid profile in 2 to 3 months  Medication Adjustments/Labs and Tests Ordered: Current medicines are reviewed at length with the patient today.  Concerns regarding medicines are outlined above.  Orders Placed This Encounter  Procedures   Lipid panel   ALT   EKG 12-Lead   Meds ordered this encounter  Medications   atorvastatin (LIPITOR) 80 MG tablet    Sig: Take 1 tablet (80 mg total) by mouth daily at 6 PM.    Dispense:  90 tablet    Refill:  3   losartan (COZAAR) 25 MG tablet    Sig: Take 1 tablet (25 mg total) by mouth daily.    Dispense:  90 tablet    Refill:  3    Patient Instructions  Medication Instructions:  REFILLED Losartan 25mg  daily REFILLED Atorvastatin 80mg  daily *If you need a refill on your cardiac medications before your next appointment, please call your pharmacy*   Lab Work: Lipids, ALT in 3 months If you have labs (blood work) drawn today and your tests are completely normal, you will receive your results only by: MyChart Message (if you have MyChart) OR A paper copy in the mail If you have any lab test that is abnormal or we need to change your treatment, we will call you to  review the results.   Testing/Procedures: NONE   Follow-Up: At [facility], you and your health needs are our priority.  As part of our continuing mission to provide you with exceptional heart care, we have created designated Provider Care Teams.  These Care Teams include your primary Cardiologist (physician) and Advanced Practice Providers (APPs -  Physician Assistants and Nurse Practitioners) who all work together to provide you with the care you need, when you need it.  We recommend signing up for the patient portal called "MyChart".  Sign up information is provided on this After Visit Summary.  MyChart is used to connect with patients for Virtual Visits (Telemedicine).  Patients are able to view lab/test results, encounter notes, upcoming appointments, etc.  Non-urgent messages can be sent to your provider as well.   To learn more about what you can do with MyChart, go to .    Your next appointment:   1 year(s)  The format for your next appointment:   In Person  Provider:   [facility], or Samaria Anes {     Important Information About Sugar         Signed, [email or web address], MD  01/12/2022 4:10 PM    [facility]

## 2022-01-12 ENCOUNTER — Encounter: Payer: Self-pay | Admitting: Cardiovascular Disease

## 2022-01-12 ENCOUNTER — Ambulatory Visit: Payer: 59 | Admitting: Cardiovascular Disease

## 2022-01-12 VITALS — BP 144/82 | HR 69 | Ht 68.0 in | Wt 186.4 lb

## 2022-01-12 DIAGNOSIS — I10 Essential (primary) hypertension: Secondary | ICD-10-CM | POA: Diagnosis not present

## 2022-01-12 DIAGNOSIS — E782 Mixed hyperlipidemia: Secondary | ICD-10-CM | POA: Diagnosis not present

## 2022-01-12 MED ORDER — LOSARTAN POTASSIUM 25 MG PO TABS: 25.0000 mg | ORAL_TABLET | Freq: Every day | ORAL | 3 refills | Status: DC

## 2022-01-12 MED ORDER — ATORVASTATIN CALCIUM 80 MG PO TABS: 80.0000 mg | ORAL_TABLET | Freq: Every day | ORAL | 3 refills | Status: DC

## 2022-01-12 NOTE — Patient Instructions (Signed)
Medication Instructions:  REFILLED Losartan 25mg  daily REFILLED Atorvastatin 80mg  daily *If you need a refill on your cardiac medications before your next appointment, please call your pharmacy*   Lab Work: Lipids, ALT in 3 months If you have labs (blood work) drawn today and your tests are completely normal, you will receive your results only by: MyChart Message (if you have MyChart) OR A paper copy in the mail If you have any lab test that is abnormal or we need to change your treatment, we will call you to review the results.   Testing/Procedures: NONE   Follow-Up: At [facility], you and your health needs are our priority.  As part of our continuing mission to provide you with exceptional heart care, we have created designated Provider Care Teams.  These Care Teams include your primary Cardiologist (physician) and Advanced Practice Providers (APPs -  Physician Assistants and Nurse Practitioners) who all work together to provide you with the care you need, when you need it.  We recommend signing up for the patient portal called "MyChart".  Sign up information is provided on this After Visit Summary.  MyChart is used to connect with patients for Virtual Visits (Telemedicine).  Patients are able to view lab/test results, encounter notes, upcoming appointments, etc.  Non-urgent messages can be sent to your provider as well.   To learn more about what you can do with MyChart, go to .    Your next appointment:   1 year(s)  The format for your next appointment:   In Person  Provider:   [facility], or [place] {     Important Information About Sugar

## 2022-03-30 ENCOUNTER — Telehealth: Payer: Self-pay | Admitting: Family Medicine

## 2022-03-30 DIAGNOSIS — E1169 Type 2 diabetes mellitus with other specified complication: Secondary | ICD-10-CM

## 2022-03-30 MED ORDER — ALBUTEROL SULFATE HFA 108 (90 BASE) MCG/ACT IN AERS: INHALATION_SPRAY | RESPIRATORY_TRACT | 1 refills | Status: DC

## 2022-03-30 NOTE — Telephone Encounter (Signed)
Medication: albuterol (VENTOLIN HFA) 108 (90 Base) MCG/ACT inhaler [[id number]]   OZEMPIC, 0.25 OR 0.5 MG/DOSE, 2 MG/1.5ML SOPN   *patient called to schedule his annual but was totally out of them. He is scheduled 04/07/22  Preferred Pharmacy (with phone number or street name): [id number] [address], [place] [address], [place] [address] Phone: [phone]  Fax: [phone]     Agent: Please be advised that RX refills may take up to 3 business days. We ask that you follow-up with your pharmacy.

## 2022-03-30 NOTE — Telephone Encounter (Signed)
Refill done.  

## 2022-03-30 NOTE — Telephone Encounter (Signed)
Patient has been off the [place] for several months. He would like to restart, ok to send in what is on his list.

## 2022-03-31 ENCOUNTER — Other Ambulatory Visit: Payer: Self-pay | Admitting: Family Medicine

## 2022-03-31 MED ORDER — OZEMPIC (0.25 OR 0.5 MG/DOSE) 2 MG/1.5ML ~~LOC~~ SOPN: PEN_INJECTOR | SUBCUTANEOUS | 1 refills | Status: DC

## 2022-03-31 NOTE — Telephone Encounter (Signed)
Called the patient and he is aware and he has an appointment next week 04/07/22. He would like the Ozempic called back in and start asap. He promises to be at his appt next week.

## 2022-03-31 NOTE — Telephone Encounter (Signed)
It's been over 1.5 yrs since he had a visit so he would need to be seen first. Ty.

## 2022-04-07 ENCOUNTER — Ambulatory Visit (INDEPENDENT_AMBULATORY_CARE_PROVIDER_SITE_OTHER): Payer: 59 | Admitting: Family Medicine

## 2022-04-07 ENCOUNTER — Encounter: Payer: Self-pay | Admitting: Family Medicine

## 2022-04-07 VITALS — BP 120/70 | HR 63 | Temp 98.3°F | Ht 68.0 in | Wt 194.2 lb

## 2022-04-07 DIAGNOSIS — Z125 Encounter for screening for malignant neoplasm of prostate: Secondary | ICD-10-CM | POA: Diagnosis not present

## 2022-04-07 DIAGNOSIS — Z0000 Encounter for general adult medical examination without abnormal findings: Secondary | ICD-10-CM | POA: Diagnosis not present

## 2022-04-07 DIAGNOSIS — E1165 Type 2 diabetes mellitus with hyperglycemia: Secondary | ICD-10-CM | POA: Diagnosis not present

## 2022-04-07 DIAGNOSIS — I10 Essential (primary) hypertension: Secondary | ICD-10-CM

## 2022-04-07 DIAGNOSIS — Z23 Encounter for immunization: Secondary | ICD-10-CM | POA: Diagnosis not present

## 2022-04-07 DIAGNOSIS — Z1211 Encounter for screening for malignant neoplasm of colon: Secondary | ICD-10-CM

## 2022-04-07 LAB — LIPID PANEL
Cholesterol: 192 mg/dL (ref 0–200)
HDL: 55.5 mg/dL (ref >39.00)
LDL Cholesterol: 101 mg/dL — ABNORMAL HIGH (ref 0–99)
NonHDL: 136.98
Total CHOL/HDL Ratio: 3
Triglycerides: 182 mg/dL — ABNORMAL HIGH (ref 0.0–149.0)
VLDL: 36.4 mg/dL (ref 0.0–40.0)

## 2022-04-07 LAB — COMPREHENSIVE METABOLIC PANEL
ALT: 56 U/L — ABNORMAL HIGH (ref 0–53)
AST: 60 U/L — ABNORMAL HIGH (ref 0–37)
Albumin: 4.4 g/dL (ref 3.5–5.2)
Alkaline Phosphatase: 57 U/L (ref 39–117)
BUN: 20 mg/dL (ref 6–23)
CO2: 29 mEq/L (ref 19–32)
Calcium: 9.1 mg/dL (ref 8.4–10.5)
Chloride: 100 mEq/L (ref 96–112)
Creatinine, Ser: 1.02 mg/dL (ref 0.40–1.50)
GFR: 78.54 mL/min (ref >60.00)
Glucose, Bld: 155 mg/dL — ABNORMAL HIGH (ref 70–99)
Potassium: 4.8 mEq/L (ref 3.5–5.1)
Sodium: 138 mEq/L (ref 135–145)
Total Bilirubin: 1.1 mg/dL (ref 0.2–1.2)
Total Protein: 7 g/dL (ref 6.0–8.3)

## 2022-04-07 LAB — CBC
HCT: 43.5 % (ref 39.0–52.0)
Hemoglobin: 15 g/dL (ref 13.0–17.0)
MCHC: 34.4 g/dL (ref 30.0–36.0)
MCV: 103.5 fl — ABNORMAL HIGH (ref 78.0–100.0)
Platelets: 177 10*3/uL (ref 150.0–400.0)
RBC: 4.2 Mil/uL — ABNORMAL LOW (ref 4.22–5.81)
RDW: 12.2 % (ref 11.5–15.5)
WBC: 8 10*3/uL (ref 4.0–10.5)

## 2022-04-07 LAB — HEMOGLOBIN A1C: Hgb A1c MFr Bld: 7.6 % — ABNORMAL HIGH (ref 4.6–6.5)

## 2022-04-07 LAB — PSA: PSA: 3.85 ng/mL (ref 0.10–4.00)

## 2022-04-07 NOTE — Patient Instructions (Addendum)
Give [place] 2-3 business days to get the results of your labs back.   Keep the diet clean and stay active.  Please get me a copy of your advanced directive form at your convenience.   If you do not hear anything about your referral in the next 1-2 weeks, call our office and ask for an update.  Please schedule your eye exam. Let me know if you need a referral.   The Shingrix vaccine (for shingles) is a 2 shot series spaced 2-6 months apart. It can make people feel low energy, achy and almost like they have the flu for 48 hours after injection. 1/5 people can have nausea and/or vomiting. Please plan accordingly when deciding on when to get this shot. Call our office for a nurse visit appointment to get this. The second shot of the series is less severe regarding the side effects, but it still lasts 48 hours.   Let us know if you need anything.  Ankle Exercises It is normal to feel mild stretching, pulling, tightness, or discomfort as you do these exercises, but you should stop right away if you feel sudden pain or your pain gets worse.  Stretching and range of motion exercises These exercises warm up your muscles and joints and improve the movement and flexibility of your ankle. These exercises also help to relieve pain, numbness, and tingling. Exercise A: Dorsiflexion/Plantar Flexion    Sit with your affected knee straight or bent. Do not rest your foot on anything. Flex your affected ankle to tilt the top of your foot toward your shin. Hold this position for 5 seconds. Point your toes downward to tilt the top of your foot away from your shin. Hold this position for 5 seconds. Repeat 2 times. Complete this exercise 3 times per week. Exercise B: Ankle Alphabet    Sit with your affected foot supported at your lower leg. Do not rest your foot on anything. Make sure your foot has room to move freely. Think of your affected foot as a paintbrush, and move your foot to trace each letter of the  alphabet in the air. Keep your hip and knee still while you trace. Make the letters as large as you can without increasing any discomfort. Trace every letter from A to Z. Repeat 2 times. Complete this exercise 3 times per week. Strengthening exercises These exercises build strength and endurance in your ankle. Endurance is the ability to use your muscles for a long time, even after they get tired. Exercise D: Dorsiflexors    Secure a rubber exercise band or tube to an object, such as a table leg, that will stay still when the band is pulled. Secure the other end around your affected foot. Sit on the floor, facing the object with your affected leg extended. The band or tube should be slightly tense when your foot is relaxed. Slowly flex your affected ankle and toes to bring your foot toward you. Hold this position for 3 seconds.  Slowly return your foot to the starting position, controlling the band as you do that. Do a total of 10 repetitions. Repeat 2 times. Complete this exercise 3 times per week. Exercise E: Plantar Flexors    Sit on the floor with your affected leg extended. Loop a rubber exercise band or tube around the ball of your affected foot. The ball of your foot is on the walking surface, right under your toes. The band or tube should be slightly tense when your foot  is relaxed. Slowly point your toes downward, pushing them away from you. Hold this position for 3 seconds. Slowly release the tension in the band or tube, controlling smoothly until your foot is back in the starting position. Repeat for a total of 10 repetitions. Repeat 2 times. Complete this exercise 3 times per week. Exercise F: Towel Curls    Sit in a chair on a non-carpeted surface, and put your feet on the floor. Place a towel in front of your feet.  Keeping your heel on the floor, put your affected foot on the towel. Pull the towel toward you by grabbing the towel with your toes and curling them under. Keep  your heel on the floor. Let your toes relax. Grab the towel again. Keep going until the towel is completely underneath your foot. Repeat for a total of 10 repetitions. Repeat 2 times. Complete this exercise 3 times per week. Exercise G: Heel Raise ( Plantar Flexors, Standing)     Stand with your feet shoulder-width apart. Keep your weight spread evenly over the width of your feet while you rise up on your toes. Use a wall or table to steady yourself, but try not to use it for support. If this exercise is too easy, try these options: Shift your weight toward your affected leg until you feel challenged. If told by your health care provider, lift your uninjured leg off the floor. Hold this position for 3 seconds. Repeat for a total of 10 repetitions. Repeat 2 times. Complete this exercise 3 times per week. Exercise H: Tandem Walking Stand with one foot directly in front of the other. Slowly raise your back foot up, lifting your heel before your toes, and place it directly in front of your other foot. Continue to walk in this heel-to-toe way for 10 steps or for as long as told by your health care provider. Have a countertop or wall nearby to use if needed to keep your balance, but try not to hold onto anything for support. Repeat 2 times. Complete this exercises 3 times per week. Make sure you discuss any questions you have with your health care provider. Document Released: 04/28/2005 Document Revised: 02/12/2016 Document Reviewed: 03/02/2015 Elsevier Interactive Patient Education  2018 [employer].

## 2022-04-07 NOTE — Addendum Note (Signed)
Addended by: Sharon Seller B on: 04/07/2022 02:19 PM   Modules accepted: Orders

## 2022-04-07 NOTE — Progress Notes (Signed)
Chief Complaint  Patient presents with   Annual Exam    Well Male Kyle Barber is here for a complete physical.   His last physical was >1 year ago.  Current diet: in general, a "pretty good" diet.  Current exercise: walking Weight trend: stable Fatigue out of ordinary? No. Seat belt? Yes.   Advanced directive? No  Health maintenance Shingrix- No Colonoscopy- Due Tetanus- Yes HIV- Yes Hep C- Yes  DM- Diet/exercise as above. He does not routinely monitor his sugars. Has been off of his Ozempic for past 2 mo. Diet/exercise as above.   Hypertension Patient presents for hypertension follow up. He does rarely monitor home blood pressures. Blood pressures ranging on average from 120's/70's. He is compliant with medications- losartan 25 mg/d, Coreg 6.25 mg bid. Patient has these side effects of medication: none Diet/exercise as above.  No Cp or SOB.    Past Medical History:  Diagnosis Date   Asthma 02/27/2016   Diabetes mellitus without complication ([place])    Hypertension    Myocardial infarct ([place])    Pancreatitis    Premature ventricular contraction       Past Surgical History:  Procedure Laterality Date   APPENDECTOMY     CORONARY STENT INTERVENTION N/A 05/14/2019   Procedure: CORONARY STENT INTERVENTION;  Surgeon: Sherren Mocha, MD;  Location: [facility] CV LAB;  Service: Cardiovascular;  Laterality: N/A;   HERNIA REPAIR     LEFT HEART CATH AND CORONARY ANGIOGRAPHY N/A 05/14/2019   Procedure: LEFT HEART CATH AND CORONARY ANGIOGRAPHY;  Surgeon: Sherren Mocha, MD;  Location: [facility] CV LAB;  Service: Cardiovascular;  Laterality: N/A;    Medications  Current Outpatient Medications on File Prior to Visit  Medication Sig Dispense Refill   albuterol (VENTOLIN HFA) 108 (90 Base) MCG/ACT inhaler INHALE 1 TO 2 PUFFS INTO THE LUNGS EVERY 6 HOURS AS NEEDED FOR WHEEZING OR SHORTNESS OF BREATH. 8.5 each 1   atorvastatin (LIPITOR) 80 MG tablet Take 1 tablet (80 mg  total) by mouth daily at 6 PM. 90 tablet 3   carvedilol (COREG) 6.25 MG tablet Take 1 tablet (6.25 mg total) by mouth 2 (two) times daily with a meal. 180 tablet 1   glucose blood test strip Test blood sugars 2 times daily. 100 each 5   losartan (COZAAR) 25 MG tablet Take 1 tablet (25 mg total) by mouth daily. 90 tablet 3   nitroGLYCERIN (NITROSTAT) 0.4 MG SL tablet Place 1 tablet (0.4 mg total) under the tongue every 5 (five) minutes as needed for chest pain. 25 tablet 3   Semaglutide,0.25 or 0.5MG /DOS, (OZEMPIC, 0.25 OR 0.5 MG/DOSE,) 2 MG/1.5ML SOPN Inject 0.25 mg into the skin once a week for 28 days, THEN 0.5 mg once a week for 28 days. 1.5 mL 1    Allergies Allergies  Allergen Reactions   Codeine Nausea Only    Family History Family History  Problem Relation Age of Onset   Emphysema Mother    Diabetes Father    CAD Father        Died of MI at age 29   Diabetes Paternal 20    CAD Paternal Grandfather        died at age 15    Review of Systems: Constitutional:  no fevers Eye:  no recent significant change in vision Ear/Nose/Mouth/Throat:  Ears:  no hearing loss Nose/Mouth/Throat:  no complaints of nasal congestion, no sore throat Cardiovascular:  no chest pain Respiratory:  no shortness of  breath Gastrointestinal:  no change in bowel habits GU:  Male: negative for dysuria, frequency Musculoskeletal/Extremities:  no joint pain Integumentary (Skin/Breast):  no abnormal skin lesions reported Neurologic:  no headaches Endocrine: No unexpected weight changes Hematologic/Lymphatic:  no abnormal bleeding  Exam BP 120/70 (BP Location: Right Arm, Patient Position: Sitting, Cuff Size: Normal)   Pulse 63   Temp 98.3 F (36.8 C) (Oral)   Ht 5\' 8"  (1.727 m)   Wt 194 lb 4 oz (88.1 kg)   SpO2 96%   BMI 29.54 kg/m  General:  well developed, well nourished, in no apparent distress Skin:  no significant moles, warts, or growths on exposed skin or on his feet Head:  no  masses, lesions, or tenderness Eyes:  pupils equal and round, sclera anicteric without injection Ears:  canals without lesions, TMs shiny without retraction, no obvious effusion, no erythema Nose:  nares patent, mucosa normal Throat/Pharynx:  lips and gingiva without lesion; tongue and uvula midline; non-inflamed pharynx; no exudates or postnasal drainage Neck: neck supple without adenopathy, thyromegaly, or masses Cardiac: RRR, no bruits, no LE edema; DP pulses 2+ b/l feet Lungs:  clear to auscultation, breath sounds equal bilaterally, no respiratory distress Abdomen: BS+, soft, non-tender, non-distended, no masses or organomegaly noted Rectal: Deferred Musculoskeletal:  symmetrical muscle groups noted without atrophy or deformity Neuro:  gait normal; deep tendon reflexes normal and symmetric; sensation intact to pinprick over b/l feet Psych: well oriented with normal range of affect and appropriate judgment/insight  Assessment and Plan  Well adult exam  Type 2 diabetes mellitus with hyperglycemia, without long-term current use of insulin (HCC) - Plan: CBC, Comprehensive metabolic panel, Lipid panel, Hemoglobin A1c, Microalbumin / creatinine urine ratio  Screening for prostate cancer - Plan: PSA  Screen for colon cancer - Plan: Ambulatory referral to Gastroenterology  Essential hypertension   Well 61 y.o. male. Counseled on diet and exercise. Counseled on risks and benefits of prostate cancer screening with PSA. The patient agrees to undergo testing. CCS: Refer to GI.  DM: Chronic, hopefully stable. Will get him back up to 0.5 mg/week of Ozempic. Will see what A1c is, want him <7.5 a1c.  HTN: Chronic, stable. Cont losartan 25 mg/d, Coreg 6.25 mg bid.  Flu shot today. Shingrix rec'd.  Advanced directive form provided today.  Immunizations, labs, and further orders as above. Follow up in 6 mo. The patient voiced understanding and agreement to the plan.  [address],  DO 04/07/22 2:17 PM

## 2022-04-08 LAB — MICROALBUMIN / CREATININE URINE RATIO
Creatinine,U: 191.8 mg/dL
Microalb Creat Ratio: 1.7 mg/g (ref 0.0–30.0)
Microalb, Ur: 3.3 mg/dL — ABNORMAL HIGH (ref 0.0–1.9)

## 2022-04-09 ENCOUNTER — Other Ambulatory Visit: Payer: 59

## 2022-04-09 ENCOUNTER — Other Ambulatory Visit: Payer: Self-pay | Admitting: Family Medicine

## 2022-04-09 DIAGNOSIS — D7589 Other specified diseases of blood and blood-forming organs: Secondary | ICD-10-CM

## 2022-04-14 ENCOUNTER — Other Ambulatory Visit: Payer: 59

## 2022-05-04 ENCOUNTER — Encounter: Payer: Self-pay | Admitting: Family Medicine

## 2022-05-04 ENCOUNTER — Ambulatory Visit: Payer: 59 | Admitting: Family Medicine

## 2022-05-04 VITALS — BP 122/80 | HR 61 | Temp 99.1°F | Ht 67.0 in | Wt 191.1 lb

## 2022-05-04 DIAGNOSIS — L719 Rosacea, unspecified: Secondary | ICD-10-CM

## 2022-05-04 DIAGNOSIS — L729 Follicular cyst of the skin and subcutaneous tissue, unspecified: Secondary | ICD-10-CM

## 2022-05-04 MED ORDER — METRONIDAZOLE 1 % EX GEL: Freq: Every day | CUTANEOUS | 2 refills | Status: DC

## 2022-05-04 NOTE — Progress Notes (Signed)
Chief Complaint  Patient presents with   infected black head on his back    Kyle Barber is a 61 y.o. male here for a skin complaint.  Duration: 1 day Location: upper back Pruritic? No Painful? Yes Drainage? No Other associated symptoms: slight redness, fevers Therapies tried thus far: none  Patient is a longstanding history of rosacea.  During the pandemic when he was forced to wear a mask, this got much worse on his face.  He was on MetroGel in the past which did seem to help.  He remained clear for a long period of time until the pandemic.  There is no pain, itching, or new soaps/lotions/detergents.  Past Medical History:  Diagnosis Date   Asthma 02/27/2016   Diabetes mellitus without complication (HCC)    Hypertension    Myocardial infarct (HCC)    Pancreatitis    Premature ventricular contraction     BP 122/80 (BP Location: Left Arm, Patient Position: Sitting, Cuff Size: Normal)   Pulse 61   Temp 99.1 F (37.3 C) (Oral)   Ht 5\' 7"  (1.702 m)   Wt 191 lb 2 oz (86.7 kg)   SpO2 99%   BMI 29.93 kg/m  Gen: awake, alert, appearing stated age Lungs: No accessory muscle use Skin: See below.  Mild TTP with slight erythema.  No excessive warmth.  No drainage, fluctuance, excoriation Over his face, there are patches of erythema, papules, and telangiectasias over both cheeks. Psych: Age appropriate judgment and insight   Right upper back region  Cyst of skin - Plan: Ambulatory referral to Dermatology  Rosacea - Plan: metroNIDAZOLE (METROGEL) 1 % gel  Appears to be an uninfected cyst, perhaps slight inflammation or irritation but not infected.  No fluctuance to suggest an abscess.  Ice, Tylenol, anti-inflammatories, refer to dermatology.  If it is painful and he cannot get in with the dermatology team in a reasonable amount of time, he will return here and I will excise it. Chronic, uncontrolled.  Restart MetroGel 1% daily.  Avoid excessive UV light exposure. F/u as  originally scheduled. The patient voiced understanding and agreement to the plan.  [place], DO 05/04/22 4:35 PM

## 2022-05-04 NOTE — Patient Instructions (Signed)
Ice/cold pack over area for 10-15 min twice daily.  OK to take Tylenol 1000 mg (2 extra strength tabs) or 975 mg (3 regular strength tabs) every 6 hours as needed.  Ibuprofen 400-600 mg (2-3 over the counter strength tabs) every 6 hours as needed for pain.  Send me a message Monday if no better.  If you do not hear anything about your referral in the next 1-2 weeks, call our office and ask for an update.  Let us know if you need anything.

## 2022-05-12 ENCOUNTER — Ambulatory Visit: Payer: 59 | Attending: Cardiovascular Disease

## 2022-05-12 DIAGNOSIS — E782 Mixed hyperlipidemia: Secondary | ICD-10-CM

## 2022-05-12 DIAGNOSIS — I10 Essential (primary) hypertension: Secondary | ICD-10-CM

## 2022-05-13 LAB — LIPID PANEL
Chol/HDL Ratio: 3.2 ratio (ref 0.0–5.0)
Cholesterol, Total: 145 mg/dL (ref 100–199)
HDL: 46 mg/dL (ref >39)
LDL Chol Calc (NIH): 76 mg/dL (ref 0–99)
Triglycerides: 133 mg/dL (ref 0–149)
VLDL Cholesterol Cal: 23 mg/dL (ref 5–40)

## 2022-05-13 LAB — ALT: ALT: 56 IU/L — ABNORMAL HIGH (ref 0–44)

## 2022-05-24 ENCOUNTER — Telehealth: Payer: Self-pay | Admitting: Cardiovascular Disease

## 2022-05-24 DIAGNOSIS — E782 Mixed hyperlipidemia: Secondary | ICD-10-CM

## 2022-05-24 DIAGNOSIS — Z79899 Other long term (current) drug therapy: Secondary | ICD-10-CM

## 2022-05-24 NOTE — Telephone Encounter (Signed)
Returned call again to patient about results. He states that he does not wish to start the zetia yet. He wants to continue working on diet and taking the crestor daily (wasn't previously taking it all the time) and then recheck labs to see where he stands before adding another medication. If needed, he states he is fine with the addition of zetia. Labs entered and scheduled for 08/23/22.

## 2022-05-24 NOTE — Telephone Encounter (Signed)
-----   Message from Vesta Mixer, MD sent at 05/14/2022  8:30 AM EST ----- Cont atorvastatin ( he needs to take daily) Add zetia 10 mg a day  Lipid and ALT in 3 months

## 2022-07-05 ENCOUNTER — Other Ambulatory Visit: Payer: Self-pay | Admitting: Family Medicine

## 2022-07-06 ENCOUNTER — Telehealth: Payer: Self-pay

## 2022-07-06 NOTE — Telephone Encounter (Signed)
PA denied. Plan requires documentation of type 2 diabetes. Last OV note faxed to [phone]. Awaiting determination.

## 2022-07-06 NOTE — Telephone Encounter (Signed)
PA initiated via Covermymeds; KEY: [id number]. Awaiting determination.

## 2022-07-07 NOTE — Telephone Encounter (Signed)
Form received- completed and faxed to back to [place] w/ records at [phone]. Awaiting determination.

## 2022-07-08 NOTE — Telephone Encounter (Signed)
Appeal approved. Effective 07/07/22 to 07/07/25.

## 2022-07-08 NOTE — Telephone Encounter (Signed)
Patient informed of approval.  

## 2022-07-14 ENCOUNTER — Ambulatory Visit: Payer: 59 | Admitting: Family Medicine

## 2022-08-23 ENCOUNTER — Other Ambulatory Visit: Payer: 59

## 2022-09-08 ENCOUNTER — Encounter: Payer: Self-pay | Admitting: Family Medicine

## 2022-09-08 ENCOUNTER — Ambulatory Visit: Payer: 59 | Admitting: Family Medicine

## 2022-09-08 VITALS — BP 128/72 | HR 70 | Temp 98.6°F | Ht 68.0 in | Wt 186.5 lb

## 2022-09-08 DIAGNOSIS — I10 Essential (primary) hypertension: Secondary | ICD-10-CM | POA: Diagnosis not present

## 2022-09-08 DIAGNOSIS — E1169 Type 2 diabetes mellitus with other specified complication: Secondary | ICD-10-CM | POA: Diagnosis not present

## 2022-09-08 DIAGNOSIS — E669 Obesity, unspecified: Secondary | ICD-10-CM | POA: Diagnosis not present

## 2022-09-08 MED ORDER — SILDENAFIL CITRATE 100 MG PO TABS: 50.0000 mg | ORAL_TABLET | Freq: Every day | ORAL | 2 refills | Status: AC | PRN

## 2022-09-08 MED ORDER — ALBUTEROL SULFATE HFA 108 (90 BASE) MCG/ACT IN AERS: 1.0000 | INHALATION_SPRAY | Freq: Four times a day (QID) | RESPIRATORY_TRACT | 2 refills | Status: DC | PRN

## 2022-09-08 MED ORDER — NITROGLYCERIN 0.4 MG SL SUBL: 0.4000 mg | SUBLINGUAL_TABLET | SUBLINGUAL | 3 refills | Status: AC | PRN

## 2022-09-08 NOTE — Progress Notes (Signed)
Subjective:   Chief Complaint  Patient presents with   Follow-up    Kyle Barber is a 62 y.o. male here for follow-up of diabetes.   Kyle Barber does not monitor his sugars at home.  Patient does not require insulin.   Medications include: Ozempic 0.5 mg/week.  Diet is fair.  Exercise: lifting wts  Hypertension Patient presents for hypertension follow up. He does not monitor home blood pressures. He is compliant with medications- losartan 25 mg/d, Coreg 6.25 mg bid. Patient has these side effects of medication: none Diet/exercise as above.  No Cp or SOB.   Past Medical History:  Diagnosis Date   Asthma 02/27/2016   Diabetes mellitus without complication (HCC)    Hypertension    Myocardial infarct ([place])    Pancreatitis    Premature ventricular contraction      Related testing: Retinal exam: Due, does have eye exam Pneumovax: done  Objective:  BP 128/72 (BP Location: Left Arm, Patient Position: Sitting, Cuff Size: Normal)   Pulse 70   Temp 98.6 F (37 C) (Oral)   Ht '5\' 8"'$  (1.727 m)   Wt 186 lb 8 oz (84.6 kg)   SpO2 98%   BMI 28.36 kg/m  General:  Well developed, well nourished, in no apparent distress Skin:  Warm, no pallor or diaphoresis Lungs:  CTAB, no access msc use Cardio:  RRR, no bruits, no LE edema Psych: Age appropriate judgment and insight  Assessment:   Diabetes mellitus type 2 in obese ([place]) - Plan: Comprehensive metabolic panel, Lipid panel, Hemoglobin A1c  Essential hypertension   Plan:   Chronic, hopefully stable. Cont Ozempic 0.5 mg/week. May need to increase. Counseled on diet and exercise. Chronic, stable. Cont losartan 25 mg/d, Coreg 6.25 mg bid.  F/u in 3-6 mo. The patient voiced understanding and agreement to the plan.  [place], DO 09/08/22 1:39 PM

## 2022-09-08 NOTE — Patient Instructions (Addendum)
Give [place] 2-3 business days to get the results of your labs back.   Keep the diet clean and stay active.  Use GoodRx for the sildenafil.   Please contact the GI team at: [phone]  Please schedule your eye exam.   Don't use nitro on Viagra or vice versa.   Let us know if you need anything.

## 2022-09-09 ENCOUNTER — Other Ambulatory Visit: Payer: Self-pay | Admitting: Family Medicine

## 2022-09-09 LAB — LIPID PANEL
Cholesterol: 201 mg/dL — ABNORMAL HIGH (ref 0–200)
HDL: 50.8 mg/dL (ref >39.00)
LDL Cholesterol: 117 mg/dL — ABNORMAL HIGH (ref 0–99)
NonHDL: 149.76
Total CHOL/HDL Ratio: 4
Triglycerides: 165 mg/dL — ABNORMAL HIGH (ref 0.0–149.0)
VLDL: 33 mg/dL (ref 0.0–40.0)

## 2022-09-09 LAB — COMPREHENSIVE METABOLIC PANEL
ALT: 57 U/L — ABNORMAL HIGH (ref 0–53)
AST: 47 U/L — ABNORMAL HIGH (ref 0–37)
Albumin: 4.5 g/dL (ref 3.5–5.2)
Alkaline Phosphatase: 55 U/L (ref 39–117)
BUN: 23 mg/dL (ref 6–23)
CO2: 25 mEq/L (ref 19–32)
Calcium: 9.5 mg/dL (ref 8.4–10.5)
Chloride: 102 mEq/L (ref 96–112)
Creatinine, Ser: 1.01 mg/dL (ref 0.40–1.50)
GFR: 79.24 mL/min (ref >60.00)
Glucose, Bld: 153 mg/dL — ABNORMAL HIGH (ref 70–99)
Potassium: 4.9 mEq/L (ref 3.5–5.1)
Sodium: 136 mEq/L (ref 135–145)
Total Bilirubin: 0.9 mg/dL (ref 0.2–1.2)
Total Protein: 7.1 g/dL (ref 6.0–8.3)

## 2022-09-09 LAB — HEMOGLOBIN A1C: Hgb A1c MFr Bld: 6.2 % (ref 4.6–6.5)

## 2022-09-09 MED ORDER — OZEMPIC (0.25 OR 0.5 MG/DOSE) 2 MG/3ML ~~LOC~~ SOPN: 0.5000 mg | PEN_INJECTOR | SUBCUTANEOUS | 5 refills | Status: DC

## 2022-09-09 MED ORDER — OZEMPIC (0.25 OR 0.5 MG/DOSE) 2 MG/3ML ~~LOC~~ SOPN: PEN_INJECTOR | SUBCUTANEOUS | 5 refills | Status: DC

## 2022-11-15 ENCOUNTER — Other Ambulatory Visit: Payer: Self-pay | Admitting: Family Medicine

## 2022-12-02 ENCOUNTER — Emergency Department (HOSPITAL_COMMUNITY)
Admission: EM | Admit: 2022-12-02 | Discharge: 2022-12-02 | Disposition: A | Discharge status: Home / Self Care | Payer: 59 | Attending: Emergency Medicine | Admitting: Emergency Medicine

## 2022-12-02 ENCOUNTER — Encounter (HOSPITAL_COMMUNITY): Payer: Self-pay

## 2022-12-02 ENCOUNTER — Emergency Department (HOSPITAL_COMMUNITY): Payer: 59

## 2022-12-02 ENCOUNTER — Other Ambulatory Visit: Payer: Self-pay

## 2022-12-02 DIAGNOSIS — Z794 Long term (current) use of insulin: Secondary | ICD-10-CM | POA: Diagnosis not present

## 2022-12-02 DIAGNOSIS — N200 Calculus of kidney: Secondary | ICD-10-CM | POA: Diagnosis not present

## 2022-12-02 DIAGNOSIS — R7989 Other specified abnormal findings of blood chemistry: Secondary | ICD-10-CM

## 2022-12-02 DIAGNOSIS — K760 Fatty (change of) liver, not elsewhere classified: Secondary | ICD-10-CM

## 2022-12-02 DIAGNOSIS — R1032 Left lower quadrant pain: Secondary | ICD-10-CM | POA: Diagnosis present

## 2022-12-02 LAB — CBC WITH DIFFERENTIAL/PLATELET
Abs Immature Granulocytes: 0.08 10*3/uL — ABNORMAL HIGH (ref 0.00–0.07)
Basophils Absolute: 0 10*3/uL (ref 0.0–0.1)
Basophils Relative: 0 %
Eosinophils Absolute: 0 10*3/uL (ref 0.0–0.5)
Eosinophils Relative: 0 %
HCT: 46.8 % (ref 39.0–52.0)
Hemoglobin: 16.6 g/dL (ref 13.0–17.0)
Immature Granulocytes: 1 %
Lymphocytes Relative: 20 %
Lymphs Abs: 2.5 10*3/uL (ref 0.7–4.0)
MCH: 35.3 pg — ABNORMAL HIGH (ref 26.0–34.0)
MCHC: 35.5 g/dL (ref 30.0–36.0)
MCV: 99.6 fL (ref 80.0–100.0)
Monocytes Absolute: 0.9 10*3/uL (ref 0.1–1.0)
Monocytes Relative: 8 %
Neutro Abs: 8.8 10*3/uL — ABNORMAL HIGH (ref 1.7–7.7)
Neutrophils Relative %: 71 %
Platelets: 233 10*3/uL (ref 150–400)
RBC: 4.7 MIL/uL (ref 4.22–5.81)
RDW: 12 % (ref 11.5–15.5)
WBC: 12.3 10*3/uL — ABNORMAL HIGH (ref 4.0–10.5)
nRBC: 0 % (ref 0.0–0.2)

## 2022-12-02 LAB — URINALYSIS, ROUTINE W REFLEX MICROSCOPIC
Bilirubin Urine: NEGATIVE
Glucose, UA: >=500 mg/dL — AB
Ketones, ur: 20 mg/dL — AB
Leukocytes,Ua: NEGATIVE
Nitrite: NEGATIVE
Protein, ur: 30 mg/dL — AB
RBC / HPF: >50 RBC/hpf (ref 0–5)
Specific Gravity, Urine: 1.023 (ref 1.005–1.030)
pH: 5 (ref 5.0–8.0)

## 2022-12-02 LAB — COMPREHENSIVE METABOLIC PANEL
ALT: 73 U/L — ABNORMAL HIGH (ref 0–44)
AST: 58 U/L — ABNORMAL HIGH (ref 15–41)
Albumin: 4.5 g/dL (ref 3.5–5.0)
Alkaline Phosphatase: 77 U/L (ref 38–126)
Anion gap: 13 (ref 5–15)
BUN: 19 mg/dL (ref 8–23)
CO2: 23 mmol/L (ref 22–32)
Calcium: 9.1 mg/dL (ref 8.9–10.3)
Chloride: 94 mmol/L — ABNORMAL LOW (ref 98–111)
Creatinine, Ser: 1.12 mg/dL (ref 0.61–1.24)
GFR, Estimated: >60 mL/min (ref >60)
Glucose, Bld: 173 mg/dL — ABNORMAL HIGH (ref 70–99)
Potassium: 4.3 mmol/L (ref 3.5–5.1)
Sodium: 130 mmol/L — ABNORMAL LOW (ref 135–145)
Total Bilirubin: 1.4 mg/dL — ABNORMAL HIGH (ref 0.3–1.2)
Total Protein: 7.7 g/dL (ref 6.5–8.1)

## 2022-12-02 MED ORDER — FENTANYL CITRATE PF 50 MCG/ML IJ SOSY
50.0000 ug | PREFILLED_SYRINGE | Freq: Once | INTRAMUSCULAR | Status: AC
  Administered 2022-12-02: 50 ug via INTRAVENOUS
  Filled 2022-12-02: qty 1

## 2022-12-02 MED ORDER — SODIUM CHLORIDE 0.9 % IV SOLN: INTRAVENOUS | Status: DC

## 2022-12-02 MED ORDER — TAMSULOSIN HCL 0.4 MG PO CAPS: 0.4000 mg | ORAL_CAPSULE | Freq: Every day | ORAL | 0 refills | Status: DC

## 2022-12-02 MED ORDER — MAGNESIUM SULFATE 2 GM/50ML IV SOLN
2.0000 g | Freq: Once | INTRAVENOUS | Status: AC
  Administered 2022-12-02: 2 g via INTRAVENOUS
  Filled 2022-12-02: qty 50

## 2022-12-02 MED ORDER — IOHEXOL 300 MG/ML  SOLN
80.0000 mL | Freq: Once | INTRAMUSCULAR | Status: AC | PRN
  Administered 2022-12-02: 100 mL via INTRAVENOUS

## 2022-12-02 MED ORDER — KETOROLAC TROMETHAMINE 30 MG/ML IJ SOLN
30.0000 mg | Freq: Once | INTRAMUSCULAR | Status: AC
  Administered 2022-12-02: 30 mg via INTRAVENOUS
  Filled 2022-12-02: qty 1

## 2022-12-02 MED ORDER — ONDANSETRON HCL 4 MG/2ML IJ SOLN
4.0000 mg | Freq: Once | INTRAMUSCULAR | Status: AC
  Administered 2022-12-02: 4 mg via INTRAVENOUS
  Filled 2022-12-02: qty 2

## 2022-12-02 MED ORDER — DICLOFENAC SODIUM ER 100 MG PO TB24: 100.0000 mg | ORAL_TABLET | Freq: Every day | ORAL | 0 refills | Status: DC

## 2022-12-02 MED ORDER — SODIUM CHLORIDE (PF) 0.9 % IJ SOLN
INTRAMUSCULAR | Status: AC
  Filled 2022-12-02: qty 50

## 2022-12-02 MED ORDER — TRAMADOL HCL 50 MG PO TABS: 50.0000 mg | ORAL_TABLET | Freq: Two times a day (BID) | ORAL | 0 refills | Status: DC | PRN

## 2022-12-02 MED ORDER — ONDANSETRON 8 MG PO TBDP: ORAL_TABLET | ORAL | 0 refills | Status: DC

## 2022-12-02 NOTE — ED Triage Notes (Addendum)
Pt reports with LLQ pain since 4 pm yesterday. Pt states that it may be his diverticulitis.

## 2022-12-02 NOTE — ED Notes (Signed)
Sent urine with culture  

## 2022-12-02 NOTE — ED Provider Notes (Signed)
EMERGENCY DEPARTMENT AT [facility] Provider Note   CSN: [id number] Arrival date & time: 12/02/22  0454     History  Chief Complaint  Patient presents with   Abdominal Pain    Kyle Barber is a 62 y.o. male.  The history is provided by the patient.  Abdominal Pain Pain location:  LLQ Pain radiates to:  Does not radiate Pain severity:  Severe Onset quality:  Sudden Duration:  12 hours Timing:  Constant Progression:  Unchanged Chronicity:  New Context: not alcohol use   Relieved by:  Nothing Worsened by:  Nothing Ineffective treatments:  None tried Associated symptoms: vomiting   Associated symptoms: no anorexia and no fever   Risk factors: no alcohol abuse and no NSAID use        Home Medications Prior to Admission medications   Medication Sig Start Date End Date Taking? Authorizing Provider  albuterol (VENTOLIN HFA) 108 (90 Base) MCG/ACT inhaler Inhale 1-2 puffs into the lungs every 6 (six) hours as needed for wheezing or shortness of breath. 09/08/22   Sharlene Dory, DO  atorvastatin (LIPITOR) 80 MG tablet Take 1 tablet (80 mg total) by mouth daily at 6 PM. 01/12/22   Nahser, Deloris Ping, MD  carvedilol (COREG) 6.25 MG tablet TAKE 1 TABLET BY MOUTH 2 TIMES DAILY WITH A MEAL. 11/15/22   Sharlene Dory, DO  glucose blood test strip Test blood sugars 2 times daily. 04/05/18   Sharlene Dory, DO  losartan (COZAAR) 25 MG tablet Take 1 tablet (25 mg total) by mouth daily. 01/12/22   Nahser, Deloris Ping, MD  metroNIDAZOLE (METROGEL) 1 % gel Apply topically daily. 05/04/22   Sharlene Dory, DO  nitroGLYCERIN (NITROSTAT) 0.4 MG SL tablet Place 1 tablet (0.4 mg total) under the tongue every 5 (five) minutes as needed for chest pain. 09/08/22   Sharlene Dory, DO  Semaglutide,0.25 or 0.5MG /DOS, (OZEMPIC, 0.25 OR 0.5 MG/DOSE,) 2 MG/3ML SOPN Inject 0.5 mg into the skin once a week. 09/09/22   Sharlene Dory, DO   sildenafil (VIAGRA) 100 MG tablet Take 0.5-1 tablets (50-100 mg total) by mouth daily as needed for erectile dysfunction. 09/08/22   Sharlene Dory, DO      Allergies    Codeine    Review of Systems   Review of Systems  Constitutional:  Negative for fever.  HENT:  Negative for facial swelling.   Eyes:  Negative for redness.  Respiratory:  Negative for wheezing and stridor.   Gastrointestinal:  Positive for abdominal pain and vomiting. Negative for anorexia.  All other systems reviewed and are negative.   Physical Exam Updated Vital Signs BP (!) 176/108 (BP Location: Left Arm)   Pulse 76   Temp 98.3 F (36.8 C) (Oral)   Resp 18   Ht 5\' 7"  (1.702 m)   Wt 77.1 kg   SpO2 100%   BMI 26.63 kg/m  Physical Exam Vitals and nursing note reviewed.  Constitutional:      General: He is not in acute distress.    Appearance: Normal appearance. He is well-developed. He is not diaphoretic.  HENT:     Head: Normocephalic and atraumatic.     Nose: Nose normal.  Eyes:     Conjunctiva/sclera: Conjunctivae normal.     Pupils: Pupils are equal, round, and reactive to light.  Cardiovascular:     Rate and Rhythm: Normal rate and regular rhythm.     Pulses: Normal pulses.  Heart sounds: Normal heart sounds.  Pulmonary:     Effort: Pulmonary effort is normal.     Breath sounds: Normal breath sounds. No wheezing or rales.  Abdominal:     General: Bowel sounds are normal.     Palpations: Abdomen is soft.     Tenderness: There is no abdominal tenderness. There is no guarding or rebound.  Musculoskeletal:        General: Normal range of motion.     Cervical back: Normal range of motion and neck supple.  Skin:    General: Skin is warm and dry.     Capillary Refill: Capillary refill takes less than 2 seconds.  Neurological:     General: No focal deficit present.     Mental Status: He is alert and oriented to person, place, and time.     Deep Tendon Reflexes: Reflexes normal.   Psychiatric:        Mood and Affect: Mood normal.        Behavior: Behavior normal.     ED Results / Procedures / Treatments   Labs (all labs ordered are listed, but only abnormal results are displayed) Results for orders placed or performed during the hospital encounter of 12/02/22  Comprehensive metabolic panel  Result Value Ref Range   Sodium 130 (L) 135 - 145 mmol/L   Potassium 4.3 3.5 - 5.1 mmol/L   Chloride 94 (L) 98 - 111 mmol/L   CO2 23 22 - 32 mmol/L   Glucose, Bld 173 (H) 70 - 99 mg/dL   BUN 19 8 - 23 mg/dL   Creatinine, Ser 1.61 0.61 - 1.24 mg/dL   Calcium 9.1 8.9 - 09.6 mg/dL   Total Protein 7.7 6.5 - 8.1 g/dL   Albumin 4.5 3.5 - 5.0 g/dL   AST 58 (H) 15 - 41 U/L   ALT 73 (H) 0 - 44 U/L   Alkaline Phosphatase 77 38 - 126 U/L   Total Bilirubin 1.4 (H) 0.3 - 1.2 mg/dL   GFR, Estimated >04 >54 mL/min   Anion gap 13 5 - 15  CBC with Diff  Result Value Ref Range   WBC 12.3 (H) 4.0 - 10.5 K/uL   RBC 4.70 4.22 - 5.81 MIL/uL   Hemoglobin 16.6 13.0 - 17.0 g/dL   HCT 09.8 11.9 - 14.7 %   MCV 99.6 80.0 - 100.0 fL   MCH 35.3 (H) 26.0 - 34.0 pg   MCHC 35.5 30.0 - 36.0 g/dL   RDW 82.9 56.2 - 13.0 %   Platelets 233 150 - 400 K/uL   nRBC 0.0 0.0 - 0.2 %   Neutrophils Relative % 71 %   Neutro Abs 8.8 (H) 1.7 - 7.7 K/uL   Lymphocytes Relative 20 %   Lymphs Abs 2.5 0.7 - 4.0 K/uL   Monocytes Relative 8 %   Monocytes Absolute 0.9 0.1 - 1.0 K/uL   Eosinophils Relative 0 %   Eosinophils Absolute 0.0 0.0 - 0.5 K/uL   Basophils Relative 0 %   Basophils Absolute 0.0 0.0 - 0.1 K/uL   Immature Granulocytes 1 %   Abs Immature Granulocytes 0.08 (H) 0.00 - 0.07 K/uL  Urinalysis, Routine w reflex microscopic -Urine, Clean Catch  Result Value Ref Range   Color, Urine YELLOW YELLOW   APPearance CLEAR CLEAR   Specific Gravity, Urine 1.023 1.005 - 1.030   pH 5.0 5.0 - 8.0   Glucose, UA >=500 (A) NEGATIVE mg/dL   Hgb urine dipstick LARGE (A)  NEGATIVE   Bilirubin Urine NEGATIVE  NEGATIVE   Ketones, ur 20 (A) NEGATIVE mg/dL   Protein, ur 30 (A) NEGATIVE mg/dL   Nitrite NEGATIVE NEGATIVE   Leukocytes,Ua NEGATIVE NEGATIVE   RBC / HPF >50 0 - 5 RBC/hpf   WBC, UA 0-5 0 - 5 WBC/hpf   Bacteria, UA RARE (A) NONE SEEN   Squamous Epithelial / HPF 0-5 0 - 5 /HPF   Mucus PRESENT    Hyaline Casts, UA PRESENT    No results found.   Radiology No results found.  Procedures Procedures    Medications Ordered in ED Medications  0.9 %  sodium chloride infusion ( Intravenous New Bag/Given 12/02/22 0529)  magnesium sulfate IVPB 2 g 50 mL (2 g Intravenous New Bag/Given 12/02/22 0627)  fentaNYL (SUBLIMAZE) injection 50 mcg (50 mcg Intravenous Given 12/02/22 0530)  ondansetron (ZOFRAN) injection 4 mg (4 mg Intravenous Given 12/02/22 0529)  iohexol (OMNIPAQUE) 300 MG/ML solution 80 mL (100 mLs Intravenous Contrast Given 12/02/22 0555)  ketorolac (TORADOL) 30 MG/ML injection 30 mg (30 mg Intravenous Given 12/02/22 9528)    ED Course/ Medical Decision Making/ A&P                             Medical Decision Making Patient with LLQ since 4 pm and one episode of emesis   Amount and/or Complexity of Data Reviewed External Data Reviewed: notes.    Details: Previous notes reviewed  Labs: ordered.    Details: Gross hematuria on urine, white count slightly elevated 12.3, hemoglobin elevated 16.6, normal platelet count.  Sodium slight low 130, normal potassium 4.3, normal creatinine 1.12 elevated AST 58 and elevated ALT 73  Radiology: ordered.    Details: Kidney stone proximal on the left by me on CT  Risk Prescription drug management. Parenteral controlled substances. Risk Details: Well appearing, no further emesis.  Stable for discharge.  Strain all urine.  Follow up with urology for ongoing care.  You cannot drink alcohol while taking pain medication this was communicated verbally to the patient and placed on the discharge paperwork.  Patient verbalizes understanding.  Stable for  discharge.  Strict return     Final Clinical Impression(s) / ED Diagnoses Final diagnoses:  None   Return for intractable cough, coughing up blood, fevers > 100.4 unrelieved by medication, shortness of breath, intractable vomiting, chest pain, shortness of breath, weakness, numbness, changes in speech, facial asymmetry, abdominal pain, passing out, Inability to tolerate liquids or food, cough, altered mental status or any concerns. No signs of systemic illness or infection. The patient is nontoxic-appearing on exam and vital signs are within normal limits.  I have reviewed the triage vital signs and the nursing notes. Pertinent labs & imaging results that were available during my care of the patient were reviewed by me and considered in my medical decision making (see chart for details). After history, exam, and medical workup I feel the patient has been appropriately medically screened and is safe for discharge home. Pertinent diagnoses were discussed with the patient. Patient was given return precautions   Rx / DC Orders ED Discharge Orders     None         Lakendria Nicastro, MD 12/02/22 4132

## 2022-12-02 NOTE — Discharge Instructions (Signed)
Do not drink alcohol or drive a care while taking pain medication

## 2022-12-07 ENCOUNTER — Telehealth: Payer: Self-pay | Admitting: Family Medicine

## 2022-12-07 NOTE — Telephone Encounter (Signed)
Pt was recently seen in the ED for kidney stones and they prescribed him TRAMADOL but he is still in pain and only has 2 left. Wants to know if he can get a small prescription from the provider to cover him for the rest of the week. Please advise.

## 2022-12-07 NOTE — Telephone Encounter (Signed)
He needs to schedule a ED Follow-up and can discuss the medication at that visit

## 2023-02-17 ENCOUNTER — Other Ambulatory Visit: Payer: Self-pay | Admitting: Cardiovascular Disease

## 2023-03-12 ENCOUNTER — Other Ambulatory Visit: Payer: Self-pay | Admitting: Cardiovascular Disease

## 2023-03-28 ENCOUNTER — Other Ambulatory Visit: Payer: Self-pay | Admitting: Cardiovascular Disease

## 2023-07-14 ENCOUNTER — Ambulatory Visit: Payer: Self-pay | Admitting: Family Medicine

## 2023-07-14 NOTE — Telephone Encounter (Signed)
Copied from CRM [id number]. Topic: Clinical - Red Word Triage >> Jul 14, 2023 10:58 AM Lennart Pall wrote: Reason for CRM: Patient has a possible kidney stone.Lower back pain. Pain is very bad at night time, hard for him to sleep.   Chief Complaint:   63 y.o male R/O Kidney Stones - Patient has Left flank pain  - Low back Pain. Patient states he had Kidney stone 11/2022 and it felt the same way. Symptoms: Moderate Pain - 5-6 on pain scale Frequency:  Constantly since last night, unable to sleep. Pertinent Negatives: Patient denies blood in urine. Disposition: [x] ED /[] Urgent Care (no appt availability in office) / [] Appointment(In office/virtual)/ []  [facility]/ [] Home Care/ [] Refused Recommended Disposition /[] [facility]/ []  Follow-up with PCP Additional Notes:  ED now. Patient is going to [facility] ER.  Reason for Disposition  MODERATE pain (e.g., interferes with normal activities or awakens from sleep)  [1] Abdominal pain AND [2] age > 60 years  [1] Unable to urinate (or only a few drops) > 4 hours AND [2] bladder feels very full (e.g., palpable bladder or strong urge to urinate)  Answer Assessment - Initial Assessment Questions 1. LOCATION: "Where does it hurt?" (e.g., left, right)        Lower back and Flank Pain  on Left side 2. ONSET: "When did the pain start?"      Last night 07/13/23 3. SEVERITY: "How bad is the pain?" (e.g., Scale 1-10; mild, moderate, or severe)  Moderate 5-6  Constant   -   - MODERATE (4-7): interferes with normal activities or awakens from sleep   4. PATTERN: "Does the pain come and go, or is it constant?"       Constant 5. CAUSE: "What do you think is causing the pain?"      Kidney Stones . Patient has Hx  Kidney Stones  last June 2024 6. OTHER SYMPTOMS:  "Do you have any other symptoms?" (e.g., fever, abdomen pain, vomiting, leg weakness, burning with urination, blood in urine)      Nausea , Increase urge but little out  put  Protocols used: Flank Pain-A-AH

## 2023-09-21 ENCOUNTER — Other Ambulatory Visit: Payer: Self-pay | Admitting: Family Medicine

## 2024-01-10 ENCOUNTER — Other Ambulatory Visit: Payer: Self-pay | Admitting: Family Medicine

## 2024-01-10 NOTE — Telephone Encounter (Signed)
 Copied from CRM [id number]. Topic: Clinical - Medication Refill >> Jan 10, 2024 12:54 PM Paige D wrote: Medication: Albuterol  inhaler & Carvedilol  6.25 MG   Has the patient contacted their pharmacy? No (Agent: If no, request that the patient contact the pharmacy for the refill. If patient does not wish to contact the pharmacy document the reason why and proceed with request.) (Agent: If yes, when and what did the pharmacy advise?)  This is the patient's preferred pharmacy:  [id number] [place], [place] - [address] [place] [address] Phone: [phone] Fax: [phone]   Is this the correct pharmacy for this prescription? Yes If no, delete pharmacy and type the correct one.   Has the prescription been filled recently? Yes  Is the patient out of the medication? Yes  Has the patient been seen for an appointment in the last year OR does the patient have an upcoming appointment? Yes  Can we respond through MyChart? Yes  Agent: Please be advised that Rx refills may take up to 3 business days. We ask that you follow-up with your pharmacy.

## 2024-01-19 ENCOUNTER — Encounter: Payer: Self-pay | Admitting: Family Medicine

## 2024-01-19 ENCOUNTER — Other Ambulatory Visit: Payer: Self-pay | Admitting: Family Medicine

## 2024-01-19 ENCOUNTER — Ambulatory Visit (INDEPENDENT_AMBULATORY_CARE_PROVIDER_SITE_OTHER): Admitting: Family Medicine

## 2024-01-19 VITALS — BP 148/90 | HR 86 | Temp 98.0°F | Resp 16 | Ht 67.0 in | Wt 181.0 lb

## 2024-01-19 DIAGNOSIS — Z125 Encounter for screening for malignant neoplasm of prostate: Secondary | ICD-10-CM

## 2024-01-19 DIAGNOSIS — E669 Obesity, unspecified: Secondary | ICD-10-CM | POA: Diagnosis not present

## 2024-01-19 DIAGNOSIS — Z1211 Encounter for screening for malignant neoplasm of colon: Secondary | ICD-10-CM

## 2024-01-19 DIAGNOSIS — Z23 Encounter for immunization: Secondary | ICD-10-CM

## 2024-01-19 DIAGNOSIS — E1169 Type 2 diabetes mellitus with other specified complication: Secondary | ICD-10-CM | POA: Diagnosis not present

## 2024-01-19 DIAGNOSIS — Z0000 Encounter for general adult medical examination without abnormal findings: Secondary | ICD-10-CM | POA: Diagnosis not present

## 2024-01-19 MED ORDER — CARVEDILOL 6.25 MG PO TABS: 6.2500 mg | ORAL_TABLET | Freq: Two times a day (BID) | ORAL | 1 refills | Status: AC

## 2024-01-19 MED ORDER — ATORVASTATIN CALCIUM 80 MG PO TABS: 80.0000 mg | ORAL_TABLET | Freq: Every day | ORAL | 3 refills | Status: AC

## 2024-01-19 MED ORDER — FLUTICASONE PROPIONATE 50 MCG/ACT NA SUSP: 2.0000 | Freq: Every day | NASAL | 6 refills | Status: DC

## 2024-01-19 MED ORDER — ALBUTEROL SULFATE HFA 108 (90 BASE) MCG/ACT IN AERS: 1.0000 | INHALATION_SPRAY | Freq: Four times a day (QID) | RESPIRATORY_TRACT | 2 refills | Status: DC | PRN

## 2024-01-19 NOTE — Patient Instructions (Addendum)
 Give us  2-3 business days to get the results of your labs back.   Keep the diet clean and stay active.  Schedule your eye exam.  Check your blood pressures 2-3 times per week, alternating the time of day you check it. If it is high, considering waiting 1-2 minutes and rechecking. If it gets higher, your anxiety is likely creeping up and we should avoid rechecking.   The Shingrix vaccine (for shingles) is a 2 shot series spaced 2-6 months apart. It can make people feel low energy, achy and almost like they have the flu for 48 hours after injection. 1/5 people can have nausea and/or vomiting. Please plan accordingly when deciding on when to get this shot. Call our office for a nurse visit appointment to get this. The second shot of the series is less severe regarding the side effects, but it still lasts 48 hours.   Please get me a copy of your advanced directive form at your convenience.   If you do not hear anything about your referral in the next 1-2 weeks, call our office and ask for an update.  Claritin (loratadine), Allegra (fexofenadine), Zyrtec (cetirizine) which is also equivalent to Xyzal (levocetirizine); these are listed in order from weakest to strongest. Generic, and therefore cheaper, options are in the parentheses.   Flonase  (fluticasone ); nasal spray that is over the counter. 2 sprays each nostril, once daily. Aim towards the same side eye when you spray.  There are available OTC, and the generic versions, which may be cheaper, are in parentheses. Show this to a pharmacist if you have trouble finding any of these items.  Let us  know if you need anything.

## 2024-01-19 NOTE — Progress Notes (Signed)
 Chief Complaint  Patient presents with   Annual Exam    CPE    Well Male Kyle Barber is here for a complete physical.   His last physical was >1 year ago.  Current diet: in general, a healthy diet.  Current exercise: active at work, starting to life wts Weight trend: stable Fatigue out of ordinary? No. Seat belt? Yes.   Advanced directive? No  Health maintenance Shingrix- No Colonoscopy- Due Tetanus- Yes HIV- Yes Hep C- Yes   Past Medical History:  Diagnosis Date   Asthma 02/27/2016   Diabetes mellitus without complication (HCC)    Hypertension    Myocardial infarct (HCC)    Pancreatitis    Premature ventricular contraction       Past Surgical History:  Procedure Laterality Date   APPENDECTOMY     CORONARY STENT INTERVENTION N/A 05/14/2019   Procedure: CORONARY STENT INTERVENTION;  Surgeon: Wonda Sharper, MD;  Location: [facility] INVASIVE CV LAB;  Service: Cardiovascular;  Laterality: N/A;   HERNIA REPAIR     LEFT HEART CATH AND CORONARY ANGIOGRAPHY N/A 05/14/2019   Procedure: LEFT HEART CATH AND CORONARY ANGIOGRAPHY;  Surgeon: Wonda Sharper, MD;  Location: [facility] INVASIVE CV LAB;  Service: Cardiovascular;  Laterality: N/A;    Medications  Current Outpatient Medications on File Prior to Visit  Medication Sig Dispense Refill   atorvastatin  (LIPITOR ) 80 MG tablet Take 1 tablet (80 mg total) by mouth daily at 6 PM. 90 tablet 3   Diclofenac  Sodium CR 100 MG 24 hr tablet Take 1 tablet (100 mg total) by mouth daily. 10 tablet 0   glucose blood test strip Test blood sugars 2 times daily. 100 each 5   losartan  (COZAAR ) 25 MG tablet Take 1 tablet (25 mg total) by mouth daily. Please call [phone] to schedule an overdue appointment with Dr. Calhoun for future refills. Thank you. 15 tablet 0   metroNIDAZOLE  (METROGEL ) 1 % gel Apply topically daily. 45 g 2   nitroGLYCERIN  (NITROSTAT ) 0.4 MG SL tablet Place 1 tablet (0.4 mg total) under the tongue every 5 (five) minutes as  needed for chest pain. 25 tablet 3   ondansetron  (ZOFRAN -ODT) 8 MG disintegrating tablet 8mg  ODT q8 hours prn nausea 10 tablet 0   Semaglutide ,0.25 or 0.5MG /DOS, (OZEMPIC , 0.25 OR 0.5 MG/DOSE,) 2 MG/3ML SOPN INJECT 0.5 MG INTO THE SKIN ONE TIME PER WEEK 3 mL 4   sildenafil  (VIAGRA ) 100 MG tablet Take 0.5-1 tablets (50-100 mg total) by mouth daily as needed for erectile dysfunction. 30 tablet 2   tamsulosin  (FLOMAX ) 0.4 MG CAPS capsule Take 1 capsule (0.4 mg total) by mouth daily. 30 capsule 0   traMADol  (ULTRAM ) 50 MG tablet Take 1 tablet (50 mg total) by mouth every 12 (twelve) hours as needed for severe pain. Do not drink alcohol or drive a care while taking this medication 7 tablet 0    Allergies Allergies  Allergen Reactions   Codeine Nausea Only    Family History Family History  Problem Relation Age of Onset   Emphysema Mother    Diabetes Father    CAD Father        Died of MI at age 57   Diabetes Paternal Grandmother    CAD Paternal Grandfather        died at age 61    Review of Systems: Constitutional:  no fevers Eye:  no recent significant change in vision Ear/Nose/Mouth/Throat:  Ears:  no hearing loss Nose/Mouth/Throat:  no complaints of  nasal congestion, no sore throat Cardiovascular:  no chest pain Respiratory:  no shortness of breath Gastrointestinal:  no change in bowel habits GU:  Male: negative for dysuria, frequency Musculoskeletal/Extremities:  no joint pain Integumentary (Skin/Breast):  no abnormal skin lesions reported Neurologic:  no headaches Endocrine: No unexpected weight changes Hematologic/Lymphatic:  no abnormal bleeding  Exam BP (!) 160/89 (BP Location: Left Arm, Patient Position: Sitting)   Pulse 86   Temp 98 F (36.7 C) (Oral)   Resp 16   Ht 5' 7 (1.702 m)   Wt 181 lb (82.1 kg)   SpO2 96%   BMI 28.35 kg/m  General:  well developed, well nourished, in no apparent distress Skin:  no significant moles, warts, or growths Head:  no  masses, lesions, or tenderness Eyes:  pupils equal and round, sclera anicteric without injection Ears:  canals without lesions, TMs shiny without retraction, no obvious effusion, no erythema Nose:  nares patent, mucosa normal Throat/Pharynx:  lips and gingiva without lesion; tongue and uvula midline; non-inflamed pharynx; no exudates or postnasal drainage Neck: neck supple without adenopathy, thyromegaly, or masses Cardiac: RRR, no bruits, no LE edema Lungs:  clear to auscultation, breath sounds equal bilaterally, no respiratory distress Abdomen: BS+, soft, non-tender, non-distended, no masses or organomegaly noted Rectal: Deferred Musculoskeletal:  symmetrical muscle groups noted without atrophy or deformity Neuro:  gait normal; deep tendon reflexes normal and symmetric Psych: well oriented with normal range of affect and appropriate judgment/insight  Assessment and Plan  Well adult exam - Plan: CBC, Comprehensive metabolic panel with GFR, Lipid panel  Type 2 diabetes mellitus with obesity (HCC) - Plan: Hemoglobin A1c, Microalbumin / creatinine urine ratio  Screen for colon cancer - Plan: Ambulatory referral to Gastroenterology  Screening for prostate cancer - Plan: PSA   Well 63 y.o. male. Counseled on diet and exercise. Counseled on risks and benefits of prostate cancer screening with PSA. The patient agrees to undergo testing. CCS: Refer GI. Advanced directive form provided today.  Shingrix rec'd.  Rec'd he sched his eye exam.  HTN: Has been of of medicine, he will monitor BP at home. If it fails to return to stable levels, he will return to clinic.  Immunizations, labs, and further orders as above. Follow up in 6 mo. The patient voiced understanding and agreement to the plan.  Mabel Mt Hammond, DO 01/19/24 4:00 PM

## 2024-01-20 ENCOUNTER — Ambulatory Visit: Payer: Self-pay | Admitting: Family Medicine

## 2024-01-20 LAB — COMPREHENSIVE METABOLIC PANEL WITH GFR
ALT: 67 U/L — ABNORMAL HIGH (ref 0–53)
AST: 84 U/L — ABNORMAL HIGH (ref 0–37)
Albumin: 4.5 g/dL (ref 3.5–5.2)
Alkaline Phosphatase: 56 U/L (ref 39–117)
BUN: 14 mg/dL (ref 6–23)
CO2: 28 meq/L (ref 19–32)
Calcium: 9.3 mg/dL (ref 8.4–10.5)
Chloride: 99 meq/L (ref 96–112)
Creatinine, Ser: 0.7 mg/dL (ref 0.40–1.50)
GFR: 97.24 mL/min (ref >60.00)
Glucose, Bld: 192 mg/dL — ABNORMAL HIGH (ref 70–99)
Potassium: 4.4 meq/L (ref 3.5–5.1)
Sodium: 137 meq/L (ref 135–145)
Total Bilirubin: 1.2 mg/dL (ref 0.2–1.2)
Total Protein: 7 g/dL (ref 6.0–8.3)

## 2024-01-20 LAB — MICROALBUMIN / CREATININE URINE RATIO
Creatinine,U: 88.7 mg/dL
Microalb Creat Ratio: 47.8 mg/g — ABNORMAL HIGH (ref 0.0–30.0)
Microalb, Ur: 4.2 mg/dL — ABNORMAL HIGH (ref 0.0–1.9)

## 2024-01-20 LAB — CBC
HCT: 44.6 % (ref 39.0–52.0)
Hemoglobin: 15.8 g/dL (ref 13.0–17.0)
MCHC: 35.5 g/dL (ref 30.0–36.0)
MCV: 99.4 fl (ref 78.0–100.0)
Platelets: 187 K/uL (ref 150.0–400.0)
RBC: 4.49 Mil/uL (ref 4.22–5.81)
RDW: 13.4 % (ref 11.5–15.5)
WBC: 7.9 K/uL (ref 4.0–10.5)

## 2024-01-20 LAB — LIPID PANEL
Cholesterol: 225 mg/dL — ABNORMAL HIGH (ref 0–200)
HDL: 39.9 mg/dL (ref >39.00)
NonHDL: 185.45
Total CHOL/HDL Ratio: 6
Triglycerides: 553 mg/dL — ABNORMAL HIGH (ref 0.0–149.0)
VLDL: 110.6 mg/dL — ABNORMAL HIGH (ref 0.0–40.0)

## 2024-01-20 LAB — HEMOGLOBIN A1C: Hgb A1c MFr Bld: 8.4 % — ABNORMAL HIGH (ref 4.6–6.5)

## 2024-01-20 LAB — LDL CHOLESTEROL, DIRECT: Direct LDL: 84 mg/dL

## 2024-01-20 LAB — PSA: PSA: 4.48 ng/mL — ABNORMAL HIGH (ref 0.10–4.00)

## 2024-01-20 MED ORDER — SEMAGLUTIDE (1 MG/DOSE) 4 MG/3ML ~~LOC~~ SOPN
1.0000 mg | PEN_INJECTOR | SUBCUTANEOUS | 2 refills | Status: DC
Start: 2024-01-20 — End: 2024-04-30

## 2024-03-01 ENCOUNTER — Ambulatory Visit: Payer: Self-pay
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
## 2024-03-01 NOTE — Telephone Encounter (Signed)
 Appt scheduled

## 2024-03-05 ENCOUNTER — Ambulatory Visit: Admitting: Family

## 2024-03-05 VITALS — BP 142/100 | HR 70 | Resp 16 | Ht 67.0 in | Wt 186.0 lb

## 2024-03-05 DIAGNOSIS — S161XXA Strain of muscle, fascia and tendon at neck level, initial encounter: Secondary | ICD-10-CM

## 2024-03-05 DIAGNOSIS — I10 Essential (primary) hypertension: Secondary | ICD-10-CM | POA: Diagnosis not present

## 2024-03-05 MED ORDER — MELOXICAM 7.5 MG PO TABS: 7.5000 mg | ORAL_TABLET | Freq: Every day | ORAL | 0 refills | Status: DC

## 2024-03-05 MED ORDER — LOSARTAN POTASSIUM 25 MG PO TABS: 25.0000 mg | ORAL_TABLET | Freq: Every day | ORAL | 0 refills | Status: DC

## 2024-03-05 NOTE — Assessment & Plan Note (Signed)
 Uncontrolled, ran out of losartan . Refill provided. He plans to schedule a follow up with cardiology who was the original prescriber.
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
## 2024-03-05 NOTE — Assessment & Plan Note (Signed)
 New. Rx with trial of meloxicam  once daily.  Offered muscle relaxer but he declines.

## 2024-04-20 ENCOUNTER — Encounter: Payer: Self-pay | Admitting: Family Medicine

## 2024-04-28 ENCOUNTER — Other Ambulatory Visit: Payer: Self-pay | Admitting: Family Medicine

## 2024-05-29 ENCOUNTER — Ambulatory Visit: Payer: Self-pay | Admitting: Medical

## 2024-05-29 ENCOUNTER — Ambulatory Visit: Payer: Self-pay

## 2024-05-29 ENCOUNTER — Encounter: Payer: Self-pay | Admitting: Medical

## 2024-05-29 ENCOUNTER — Ambulatory Visit: Admitting: Medical

## 2024-05-29 VITALS — BP 155/80 | HR 80 | Temp 98.1°F | Resp 15 | Ht 67.0 in | Wt 189.2 lb

## 2024-05-29 DIAGNOSIS — E1165 Type 2 diabetes mellitus with hyperglycemia: Secondary | ICD-10-CM

## 2024-05-29 DIAGNOSIS — R6884 Jaw pain: Secondary | ICD-10-CM

## 2024-05-29 DIAGNOSIS — R221 Localized swelling, mass and lump, neck: Secondary | ICD-10-CM

## 2024-05-29 DIAGNOSIS — F419 Anxiety disorder, unspecified: Secondary | ICD-10-CM

## 2024-05-29 DIAGNOSIS — R0981 Nasal congestion: Secondary | ICD-10-CM

## 2024-05-29 DIAGNOSIS — M2550 Pain in unspecified joint: Secondary | ICD-10-CM

## 2024-05-29 DIAGNOSIS — I10 Essential (primary) hypertension: Secondary | ICD-10-CM

## 2024-05-29 DIAGNOSIS — R5383 Other fatigue: Secondary | ICD-10-CM

## 2024-05-29 LAB — COMPREHENSIVE METABOLIC PANEL WITH GFR
ALT: 71 U/L — ABNORMAL HIGH (ref 0–53)
AST: 67 U/L — ABNORMAL HIGH (ref 0–37)
Albumin: 4.6 g/dL (ref 3.5–5.2)
Alkaline Phosphatase: 68 U/L (ref 39–117)
BUN: 13 mg/dL (ref 6–23)
CO2: 29 meq/L (ref 19–32)
Calcium: 9.8 mg/dL (ref 8.4–10.5)
Chloride: 97 meq/L (ref 96–112)
Creatinine, Ser: 0.78 mg/dL (ref 0.40–1.50)
GFR: 93.88 mL/min (ref >60.00)
Glucose, Bld: 237 mg/dL — ABNORMAL HIGH (ref 70–99)
Potassium: 4.6 meq/L (ref 3.5–5.1)
Sodium: 135 meq/L (ref 135–145)
Total Bilirubin: 1.2 mg/dL (ref 0.2–1.2)
Total Protein: 7.3 g/dL (ref 6.0–8.3)

## 2024-05-29 LAB — CBC WITH DIFFERENTIAL/PLATELET
Basophils Absolute: 0 K/uL (ref 0.0–0.1)
Basophils Relative: 0.4 % (ref 0.0–3.0)
Eosinophils Absolute: 0.1 K/uL (ref 0.0–0.7)
Eosinophils Relative: 0.9 % (ref 0.0–5.0)
HCT: 49.2 % (ref 39.0–52.0)
Hemoglobin: 17.3 g/dL — ABNORMAL HIGH (ref 13.0–17.0)
Lymphocytes Relative: 23.8 % (ref 12.0–46.0)
Lymphs Abs: 2 K/uL (ref 0.7–4.0)
MCHC: 35.1 g/dL (ref 30.0–36.0)
MCV: 101.1 fl — ABNORMAL HIGH (ref 78.0–100.0)
Monocytes Absolute: 0.5 K/uL (ref 0.1–1.0)
Monocytes Relative: 5.8 % (ref 3.0–12.0)
Neutro Abs: 5.8 K/uL (ref 1.4–7.7)
Neutrophils Relative %: 69.1 % (ref 43.0–77.0)
Platelets: 187 K/uL (ref 150.0–400.0)
RBC: 4.87 Mil/uL (ref 4.22–5.81)
RDW: 13.1 % (ref 11.5–15.5)
WBC: 8.4 K/uL (ref 4.0–10.5)

## 2024-05-29 LAB — VITAMIN B12: Vitamin B-12: 176 pg/mL — ABNORMAL LOW (ref 211–911)

## 2024-05-29 LAB — HEMOGLOBIN A1C: Hgb A1c MFr Bld: 8.6 % — ABNORMAL HIGH (ref 4.6–6.5)

## 2024-05-29 LAB — SEDIMENTATION RATE: Sed Rate: 4 mm/h (ref 0–20)

## 2024-05-29 LAB — TSH: TSH: 1.28 u[IU]/mL (ref 0.35–5.50)

## 2024-05-29 LAB — C-REACTIVE PROTEIN: CRP: 0.6 mg/dL (ref 0.5–20.0)

## 2024-05-29 LAB — T4, FREE: Free T4: 0.65 ng/dL (ref 0.60–1.60)

## 2024-05-29 MED ORDER — AZELASTINE HCL 0.1 % NA SOLN: 2.0000 | Freq: Two times a day (BID) | NASAL | 12 refills | Status: AC

## 2024-05-29 MED ORDER — AMOXICILLIN-POT CLAVULANATE 875-125 MG PO TABS: 1.0000 | ORAL_TABLET | Freq: Two times a day (BID) | ORAL | 0 refills | Status: DC

## 2024-05-29 MED ORDER — LOSARTAN POTASSIUM 50 MG PO TABS: 50.0000 mg | ORAL_TABLET | Freq: Every day | ORAL | 0 refills | Status: DC

## 2024-05-29 NOTE — Patient Instructions (Addendum)
 Essential hypertension Blood pressure elevated, improved with medication. Possible missed doses of losartan  and carvedilol . Mild headache and fatigue possibly related to hypertension. Alcohol may contribute to missed doses. - Increased losartan  to 50 mg daily. - Continue carvedilol  6.25 mg twice daily. - Monitor blood pressure at home with new batteries. - Follow up with cardiologist in one week. - Seek emergency care if neurological or cardiac symptoms occur.  Type 2 diabetes mellitus with hyperglycemia A1c not recently checked. Reports increased carbohydrate intake and possible hyperglycemia. - Ordered A1c test.  Depression and anxiety(both phq 9 and gad 7 scores are 7) Reports feeling overwhelmed and fatigued. No current medication for depression or anxiety. Seasonal affective disorder suspected. - Continue to monitor mood and stress levels.  Localized neck swelling and jaw pain Swelling and pain possibly related to dental issues. Enlarged lymph node noted or mass - Ordered ultrasound of neck soft tissue. - Ordered labs including CBC, B12, B1, thyroid studies. - Prescribed Augmentin  if jaw pain or enlarged lymph node persists. - Follow up with dentist for evaluation of potential dental issues.  Nasal congestion Chronic nasal congestion. Previous Flonase  use caused jitteriness. - Prescribed Astelin nasal spray, two sprays each nostril daily.  Arthralgia, multiple joints Recent joint pain resolved spontaneously. - Ordered inflammatory lab studies including rheumatoid factor, sed rate, ANA, and C-reactive protein.  Fatigue Fatigue possibly related to stress, alcohol, and potential vitamin deficiencies. - Ordered labs including CBC, B12, B1, and thyroid studies.  Follow up in one week or sooner if needed
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
## 2024-05-29 NOTE — Telephone Encounter (Signed)
 FYI Only or Action Required?: FYI only for provider: appointment scheduled on 05/29/24.  Patient was last seen in primary care on 03/05/2024 by Daryl Setter, NP.  Called Nurse Triage reporting Hypertension.  Symptoms began Sunday.  Interventions attempted: Prescription medications: carvedilol and losartan taken this morning.  Symptoms are: unchanged.  Triage Disposition: See Today in Office (overriding See PCP When Office is Open (Within 3 Days))  Patient/caregiver understands and will follow disposition?: Yes            Copied from CRM #[id number]. Topic: Clinical - Red Word Triage >> May 29, 2024  8:40 AM Ashley D wrote: Red Word that prompted transfer to Nurse Triage: patient called stating he went to work not feeling well and they took his blood pressure and it is 174/100 Reason for Disposition  Systolic BP >= 160 OR Diastolic >= 100  Answer Assessment - Initial Assessment Questions 1. BLOOD PRESSURE: What is your blood pressure? Did you take at least two measurements 5 minutes apart?     17 4/100  2. ONSET: When did you take your blood pressure?     This morning about 20 minutes ago.  3. HOW: How did you take your blood pressure? 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  4. HISTORY: Do you have a history of high blood pressure?     Yes.  5. MEDICINES: Are you taking any medicines for blood pressure? Have you missed any doses recently?     Carvedilol  and losartan . Took doses this morning but thinks he missed on Sunday.  6. OTHER SYMPTOMS: Do you have any symptoms? (e.g., blurred vision, chest pain, difficulty breathing, headache, weakness)     Patient states he has been under a lot of stress recently, depression (denies self harm or suicidal ideation); sinus headache this morning. He states his symptoms started on Sunday, not feeling well with joint pain in his elbows and knees. Denies  chest pain, worsening SOB (states at baseline he has asthma and has some SOB all the time), unilateral numbness or weakness, changes in speech or vision, unsteady gait, similar feelings to when he had a heart attack 5 years ago, fever or cough.  Protocols used: Blood Pressure - High-A-AH

## 2024-05-30 ENCOUNTER — Ambulatory Visit (HOSPITAL_BASED_OUTPATIENT_CLINIC_OR_DEPARTMENT_OTHER)

## 2024-06-01 LAB — VITAMIN B1: Vitamin B1 (Thiamine): 8 nmol/L (ref 8–30)

## 2024-06-01 LAB — RHEUMATOID FACTOR: Rheumatoid fact SerPl-aCnc: <10 [IU]/mL (ref <14)

## 2024-06-01 LAB — ANA: Anti Nuclear Antibody (ANA): NEGATIVE

## 2024-06-01 NOTE — Progress Notes (Signed)
 Pt called vm left for pt to return call

## 2024-06-26 ENCOUNTER — Ambulatory Visit: Attending: Cardiology | Admitting: Cardiology

## 2024-06-26 ENCOUNTER — Encounter: Payer: Self-pay | Admitting: Cardiology

## 2024-06-26 ENCOUNTER — Telehealth: Payer: Self-pay

## 2024-06-26 VITALS — BP 136/98 | HR 64 | Ht 67.0 in | Wt 185.4 lb

## 2024-06-26 DIAGNOSIS — Z79899 Other long term (current) drug therapy: Secondary | ICD-10-CM

## 2024-06-26 DIAGNOSIS — I10 Essential (primary) hypertension: Secondary | ICD-10-CM | POA: Diagnosis not present

## 2024-06-26 DIAGNOSIS — I440 Atrioventricular block, first degree: Secondary | ICD-10-CM | POA: Diagnosis not present

## 2024-06-26 DIAGNOSIS — E782 Mixed hyperlipidemia: Secondary | ICD-10-CM | POA: Diagnosis not present

## 2024-06-26 DIAGNOSIS — I2510 Atherosclerotic heart disease of native coronary artery without angina pectoris: Secondary | ICD-10-CM | POA: Diagnosis not present

## 2024-06-26 MED ORDER — ASPIRIN 81 MG PO TBEC: 81.0000 mg | DELAYED_RELEASE_TABLET | Freq: Every day | ORAL | 3 refills | Status: AC

## 2024-06-26 NOTE — Patient Instructions (Signed)
 Medication Instructions:  Start Aspirin  81mg  daily  *If you need a refill on your cardiac medications before your next appointment, please call your pharmacy*  Lab Work: LFTs in 2 months If you have labs (blood work) drawn today and your tests are completely normal, you will receive your results only by: MyChart Message (if you have MyChart) OR A paper copy in the mail If you have any lab test that is abnormal or we need to change your treatment, we will call you to review the results.  Testing/Procedures: None   Follow-Up: At [facility], you and your health needs are our priority.  As part of our continuing mission to provide you with exceptional heart care, our providers are all part of one team.  This team includes your primary Cardiologist (physician) and Advanced Practice Providers or APPs (Physician Assistants and Nurse Practitioners) who all work together to provide you with the care you need, when you need it.  Your next appointment:    1 -2 Months with Dr. Frann your primary care provider.  Provider:   1 year with Dr. Francyne    Other Instructions Be sure to take your mediations as directed.

## 2024-06-26 NOTE — Progress Notes (Signed)
 " Cardiology Office Note:  .   Date:  06/26/2024  ID:  Kyle Barber, DOB 1960/07/19, MRN [id number] PCP: [name], DO  [facility] Providers Cardiologist:  Aleene Passe, MD (Inactive) {  History of Present Illness: .   Kyle Barber is a 63 y.o. male with history of CAD s/p NSTEMI 04/2019 with DES to LCx, DM2, former tobacco use, pancreatitis, diabetes, and hypertension.     CAD 04/2019 NSTEMI.  Symptoms of abdominal pain with radiation to shoulder and down his left arm.  Had CTO of his LCx which was stented.  Moderate LAD stenosis. 07/2019 ED visit for fleeting burning chest pain radiating across chest.  Normal troponins and EKG.  Seen in follow-up not felt to be cardiac.  Imdur  discontinued due to headaches.  Social history  Former smoker.  Occasionally drinks alcohol.  No drug use. Lately active 1-2 times per week. Acupuncturist     Patient with history of NSTEMI 2020 with PCI to Lcx.  Last seen 12/2021 doing well from a cardiac perspective.  No anginal complaints at that time.  Was not taking his atorvastatin , forgetting.  Ran out of losartan .  12/27 seen in the ED 2 times in 1 day.  Initially for elevated blood pressure.  Then on his second evaluation he had reported abdominal pain with CT indicating 7 mm obstructing left proximal ureteral stone.  12/28 seen again for the third time for chest pain with negative troponins.  First-degree heart block noted at that time, they have reported possible second-degree.  Today patient presents for annual follow-up and after ED visit.  Patient reports that rarely he will experience nonexertional, fleeting, stabbing chest pain that only occurs for seconds at a time.  Ambulates at work and goes on walks during his break without any associated symptoms.  He says symptoms are mildly discomforting and he thinks sometimes he might be feeling PVCs as he has been told he has had these in the past.  Otherwise with no  exertional complaints.  He reports loosed compliance with his atorvastatin  and has not been taking consistently for the last couple months or so.  He is a little bit concerned about his blood pressure.  Reports readings are anywhere between 130-150.  PCP had just increased his losartan  to 50 mg earlier this month.  Also he reports headache with neck pain with sinus congestion.  Reports working on the microscope all last week.  May have tension headache secondary to MSK pain.  ROS: Denies: shortness of breath, orthopnea, peripheral edema, palpitations, syncope, decreased exercise capacity, fatigue, dizziness.   Studies Reviewed: SABRA    EKG Interpretation Date/Time:  Tuesday June 26 2024 10:53:53 EST Ventricular Rate:  64 PR Interval:  310 QRS Duration:  80 QT Interval:  378 QTC Calculation: 389 R Axis:   54  Text Interpretation: Sinus rhythm with 1st degree A-V block When compared with ECG of 14-Aug-2019 15:36, PR interval has increased Confirmed by Darryle Currier [phone]) on 06/26/2024 11:21:58 AM    Risk Assessment/Calculations:       Physical Exam:   VS:  BP (!) 136/98   Pulse 64   Ht 5' 7 (1.702 m)   Wt 185 lb 6.4 oz (84.1 kg)   SpO2 97%   BMI 29.04 kg/m    Wt Readings from Last 3 Encounters:  06/26/24 185 lb 6.4 oz (84.1 kg)  05/29/24 189 lb 3.2 oz (85.8 kg)  03/05/24 186 lb (84.4 kg)  GEN: Well nourished, well developed in no acute distress NECK: No JVD; No carotid bruits CARDIAC: RRR, no murmurs, rubs, gallops RESPIRATORY:  Clear to auscultation without rales, wheezing or rhonchi  ABDOMEN: Soft, non-tender, non-distended EXTREMITIES:  No edema; No deformity   ASSESSMENT AND PLAN: .    Chest pain CAD Hyperlipidemia -04/2019 PCI to LCx.  EF normal.  Moderate LAD disease. Recently seen at the ED, EKG with no acute ST-T wave changes.  Troponins negative.  He is reporting nonexertional, fleeting, stabbing chest pain that only occurs for a few seconds at a time.  Only  happening on rare occasions.  Suspect that this is likely noncardiac related chest pain and symptoms might be coinciding with prior history of PVCs. Has not been on aspirin , start 81 mg to be taken daily.  Loosely compliant with his atorvastatin  80 mg.  Encourage better compliancy with this medication.  LDL 12/2023 was 84.  Goal less than 55.  Repeat fasting lipid panel in 2 months and titrate as necessary. Continue carvedilol  6.25 mg twice daily.  Not using nitroglycerin . If he has persistent symptoms that have exertional component encouraged to follow back up given he has moderate LAD disease noted previously.  First-degree AV block PR quite long at over 310.  Completely asymptomatic at this time.  Okay to continue beta-blocker for the time being but educated on symptoms that would warrant further evaluation, he will let us  know.  Discussed with DOD Dr. Francyne who agreed as well.  Diabetes A1c 8.6%.  Needs improvement.  Hypertension Blood pressure he reports is around 130-150 systolics generally.  Today blood pressure 136/98.  Recently increased by PCP earlier this month to 50 mg. For now continue with carvedilol  6.25 mg twice daily and losartan  50 mg daily.  He has a kidney stone right now and based off of labs on the 28th he has mild AKI.  Encouraged him to follow-up with his PCP for further management of blood pressure and kidney stone.  Nephrolithiasis Has 7 mm obstructing kidney stone.  Follow-up with PCP as soon as possible.    Dispo: 1 year follow-up with Dr. Francyne to establish new cardiologist.  He is DOD today.  Signed, Thom LITTIE Sluder, PA-C  "

## 2024-06-26 NOTE — Telephone Encounter (Signed)
 The patient was notified that [place], [place] has changed his lab order to a lipid panel in 2 months instead of the liver function panel test.

## 2024-06-29 ENCOUNTER — Other Ambulatory Visit: Payer: Self-pay | Admitting: Medical

## 2024-07-03 ENCOUNTER — Ambulatory Visit: Payer: Self-pay

## 2024-07-03 ENCOUNTER — Inpatient Hospital Stay (HOSPITAL_COMMUNITY)
Admission: EM | Admit: 2024-07-03 | Discharge: 2024-07-06 | DRG: 244 | Disposition: A | Discharge status: Home / Self Care | Attending: Cardiovascular Disease | Admitting: Cardiovascular Disease

## 2024-07-03 ENCOUNTER — Emergency Department (HOSPITAL_COMMUNITY)

## 2024-07-03 ENCOUNTER — Other Ambulatory Visit: Payer: Self-pay

## 2024-07-03 DIAGNOSIS — E119 Type 2 diabetes mellitus without complications: Secondary | ICD-10-CM | POA: Diagnosis present

## 2024-07-03 DIAGNOSIS — R002 Palpitations: Secondary | ICD-10-CM

## 2024-07-03 DIAGNOSIS — Z8249 Family history of ischemic heart disease and other diseases of the circulatory system: Secondary | ICD-10-CM

## 2024-07-03 DIAGNOSIS — Z7982 Long term (current) use of aspirin: Secondary | ICD-10-CM | POA: Diagnosis not present

## 2024-07-03 DIAGNOSIS — Z955 Presence of coronary angioplasty implant and graft: Secondary | ICD-10-CM

## 2024-07-03 DIAGNOSIS — Z79899 Other long term (current) drug therapy: Secondary | ICD-10-CM | POA: Diagnosis not present

## 2024-07-03 DIAGNOSIS — I2510 Atherosclerotic heart disease of native coronary artery without angina pectoris: Secondary | ICD-10-CM | POA: Diagnosis present

## 2024-07-03 DIAGNOSIS — Z87891 Personal history of nicotine dependence: Secondary | ICD-10-CM | POA: Diagnosis not present

## 2024-07-03 DIAGNOSIS — R42 Dizziness and giddiness: Secondary | ICD-10-CM

## 2024-07-03 DIAGNOSIS — I959 Hypotension, unspecified: Secondary | ICD-10-CM | POA: Diagnosis present

## 2024-07-03 DIAGNOSIS — I442 Atrioventricular block, complete: Secondary | ICD-10-CM | POA: Diagnosis present

## 2024-07-03 DIAGNOSIS — R079 Chest pain, unspecified: Secondary | ICD-10-CM

## 2024-07-03 DIAGNOSIS — I441 Atrioventricular block, second degree: Principal | ICD-10-CM | POA: Diagnosis present

## 2024-07-03 DIAGNOSIS — Z7985 Long-term (current) use of injectable non-insulin antidiabetic drugs: Secondary | ICD-10-CM

## 2024-07-03 DIAGNOSIS — I10 Essential (primary) hypertension: Secondary | ICD-10-CM | POA: Diagnosis present

## 2024-07-03 DIAGNOSIS — J45909 Unspecified asthma, uncomplicated: Secondary | ICD-10-CM | POA: Diagnosis present

## 2024-07-03 DIAGNOSIS — Z9049 Acquired absence of other specified parts of digestive tract: Secondary | ICD-10-CM | POA: Diagnosis not present

## 2024-07-03 DIAGNOSIS — R001 Bradycardia, unspecified: Secondary | ICD-10-CM | POA: Diagnosis not present

## 2024-07-03 DIAGNOSIS — I493 Ventricular premature depolarization: Secondary | ICD-10-CM | POA: Diagnosis present

## 2024-07-03 DIAGNOSIS — E781 Pure hyperglyceridemia: Secondary | ICD-10-CM | POA: Diagnosis present

## 2024-07-03 DIAGNOSIS — I252 Old myocardial infarction: Secondary | ICD-10-CM | POA: Diagnosis not present

## 2024-07-03 DIAGNOSIS — Z825 Family history of asthma and other chronic lower respiratory diseases: Secondary | ICD-10-CM

## 2024-07-03 DIAGNOSIS — Z833 Family history of diabetes mellitus: Secondary | ICD-10-CM | POA: Diagnosis not present

## 2024-07-03 LAB — CBC WITH DIFFERENTIAL/PLATELET
Abs Immature Granulocytes: 0.07 K/uL (ref 0.00–0.07)
Basophils Absolute: 0.1 K/uL (ref 0.0–0.1)
Basophils Relative: 1 %
Eosinophils Absolute: 0.1 K/uL (ref 0.0–0.5)
Eosinophils Relative: 1 %
HCT: 40.6 % (ref 39.0–52.0)
Hemoglobin: 14.9 g/dL (ref 13.0–17.0)
Immature Granulocytes: 1 %
Lymphocytes Relative: 25 %
Lymphs Abs: 2.7 K/uL (ref 0.7–4.0)
MCH: 36.1 pg — ABNORMAL HIGH (ref 26.0–34.0)
MCHC: 36.7 g/dL — ABNORMAL HIGH (ref 30.0–36.0)
MCV: 98.3 fL (ref 80.0–100.0)
Monocytes Absolute: 0.8 K/uL (ref 0.1–1.0)
Monocytes Relative: 7 %
Neutro Abs: 7.2 K/uL (ref 1.7–7.7)
Neutrophils Relative %: 65 %
Platelets: 220 K/uL (ref 150–400)
RBC: 4.13 MIL/uL — ABNORMAL LOW (ref 4.22–5.81)
RDW: 11.8 % (ref 11.5–15.5)
WBC: 10.9 K/uL — ABNORMAL HIGH (ref 4.0–10.5)
nRBC: 0 % (ref 0.0–0.2)

## 2024-07-03 LAB — TROPONIN T, HIGH SENSITIVITY: Troponin T High Sensitivity: <15 ng/L (ref 0–19)

## 2024-07-03 LAB — RESP PANEL BY RT-PCR (RSV, FLU A&B, COVID)  RVPGX2
Influenza A by PCR: NEGATIVE
Influenza B by PCR: NEGATIVE
Resp Syncytial Virus by PCR: NEGATIVE
SARS Coronavirus 2 by RT PCR: NEGATIVE

## 2024-07-03 LAB — COMPREHENSIVE METABOLIC PANEL WITH GFR
ALT: 46 U/L — ABNORMAL HIGH (ref 0–44)
AST: 42 U/L — ABNORMAL HIGH (ref 15–41)
Albumin: 4.1 g/dL (ref 3.5–5.0)
Alkaline Phosphatase: 65 U/L (ref 38–126)
Anion gap: 10 (ref 5–15)
BUN: 16 mg/dL (ref 8–23)
CO2: 24 mmol/L (ref 22–32)
Calcium: 8.8 mg/dL — ABNORMAL LOW (ref 8.9–10.3)
Chloride: 104 mmol/L (ref 98–111)
Creatinine, Ser: 0.9 mg/dL (ref 0.61–1.24)
GFR, Estimated: >60 mL/min
Glucose, Bld: 188 mg/dL — ABNORMAL HIGH (ref 70–99)
Potassium: 4.6 mmol/L (ref 3.5–5.1)
Sodium: 139 mmol/L (ref 135–145)
Total Bilirubin: 1.2 mg/dL (ref 0.0–1.2)
Total Protein: 6.8 g/dL (ref 6.5–8.1)

## 2024-07-03 LAB — PRO BRAIN NATRIURETIC PEPTIDE: Pro Brain Natriuretic Peptide: 427 pg/mL — ABNORMAL HIGH

## 2024-07-03 LAB — MAGNESIUM: Magnesium: 1.2 mg/dL — ABNORMAL LOW (ref 1.7–2.4)

## 2024-07-03 LAB — CBG MONITORING, ED: Glucose-Capillary: 202 mg/dL — ABNORMAL HIGH (ref 70–99)

## 2024-07-03 LAB — TSH: TSH: 2.02 u[IU]/mL (ref 0.350–4.500)

## 2024-07-03 MED ORDER — ONDANSETRON HCL 4 MG/2ML IJ SOLN: 4.0000 mg | Freq: Four times a day (QID) | INTRAMUSCULAR | Status: DC | PRN

## 2024-07-03 MED ORDER — ASPIRIN 81 MG PO TBEC
81.0000 mg | DELAYED_RELEASE_TABLET | Freq: Every day | ORAL | Status: DC
  Administered 2024-07-03 – 2024-07-04 (×2): 81 mg via ORAL
  Filled 2024-07-03 (×2): qty 1

## 2024-07-03 MED ORDER — ATORVASTATIN CALCIUM 80 MG PO TABS
80.0000 mg | ORAL_TABLET | Freq: Every day | ORAL | Status: DC
  Administered 2024-07-04: 80 mg via ORAL
  Filled 2024-07-03: qty 1

## 2024-07-03 MED ORDER — MAGNESIUM SULFATE 4 GM/100ML IV SOLN
4.0000 g | Freq: Once | INTRAVENOUS | Status: AC
  Administered 2024-07-03: 4 g via INTRAVENOUS
  Filled 2024-07-03: qty 100

## 2024-07-03 MED ORDER — ACETAMINOPHEN 325 MG PO TABS
650.0000 mg | ORAL_TABLET | ORAL | Status: DC | PRN
  Administered 2024-07-04 – 2024-07-05 (×3): 650 mg via ORAL
  Filled 2024-07-03 (×2): qty 2

## 2024-07-03 NOTE — ED Notes (Signed)
 Needs to be redrawn light green.

## 2024-07-03 NOTE — Telephone Encounter (Signed)
 FYI Only or Action Required?: FYI only for provider: ED advised.  Patient was last seen in primary care on 05/29/2024 by [name], PA-C.  Called Nurse Triage reporting Heart Problem. HR is 41, dizziness, lightheadedness.  Symptoms began a week ago.  Interventions attempted: Other: See cardiology, has gone to ED 2 times.  Symptoms are: gradually worsening.  Triage Disposition: Go to ED Now (Notify PCP)  Patient/caregiver understands and will follow disposition?: Yes                  Copied from CRM [id number]. Topic: Clinical - Red Word Triage >> Jul 03, 2024  8:24 AM Willma SAUNDERS wrote: Red Word that prompted transfer to Nurse Triage: Patient has an appointment tomorrow but is currently experiencing lightheadedness and dizziness. BP this morning at 6ma was  163/96 and at 8am it was  114/71 heart rate of 42. Wants to try to get in sooner. Reason for Disposition  [1] Heart beating very slowly (e.g., < 50 / minute) AND [2] present now  (Exception: Athlete and heart rate is normal for caller.)  Answer Assessment - Initial Assessment Questions 1. DESCRIPTION: Please describe your heart rate or heartbeat that you are having (e.g., fast/slow, regular/irregular, skipped or extra beats, palpitations)     HR 41 - BP 125/74 2. ONSET: When did it start? (e.g., minutes, hours, days)      Ongoing -  having some lightheadedness and dizziness 3. DURATION: How long does it last (e.g., seconds, minutes, hours)     ongoing 4. PATTERN Does it come and go, or has it been constant since it started?  Does it get worse with exertion?   Are you feeling it now?     constant  6. HEART RATE: Can you tell me your heart rate? How many beats in 15 seconds?  Note: Not all patients can do this.       41 7. RECURRENT SYMPTOM: Have you ever had this before? If Yes, ask: When was the last time? and What happened that time?      yes 8. CAUSE: What do you think is causing the  palpitations?     Heart block 9. CARDIAC HISTORY: Do you have any history of heart disease? (e.g., heart attack, angina, bypass surgery, angioplasty, arrhythmia)      yes 10. OTHER SYMPTOMS: Do you have any other symptoms? (e.g., dizziness, chest pain, sweating, difficulty breathing)       Dizziness, lightheaded  Protocols used: Heart Rate and Heartbeat Questions-A-AH

## 2024-07-03 NOTE — ED Notes (Addendum)
 Provider McAlhanany MD aware of sustained low HR into 30s via secure chat.

## 2024-07-03 NOTE — ED Notes (Signed)
 PT IS ASYMPTOMATIC AT THIS TIME

## 2024-07-03 NOTE — Telephone Encounter (Signed)
FYI. Pt going to ED.  

## 2024-07-03 NOTE — ED Notes (Signed)
 [facility] paged by Rumalda 10:24 per verbal order of Dr Ginger

## 2024-07-03 NOTE — ED Notes (Signed)
 McALhany MD contacted via secure chat about intermittent unsustained hr drop to 30s. No new orders at this time.

## 2024-07-03 NOTE — ED Notes (Signed)
 Pt added to CCMD, instructed to let RN know if pt HR remains sustained in 30s.

## 2024-07-03 NOTE — ED Triage Notes (Signed)
 Pt. Stated, I went to Dr. On Tuesday and he said I had a 1st Degree heart block but it was ok. I went to work and started feeling funny and my BP was high and my HR was low. I called my PCP and they said to come here.

## 2024-07-03 NOTE — H&P (Addendum)
 "  Cardiology Admission History and Physical   Patient ID: Kyle Barber MRN: [id number]; DOB: [birth year]   Admission date: 07/03/2024  PCP:  Kyle Mabel Mt, DO   [facility] Providers Cardiologist:  Lonni Cash, MD    Chief Complaint:  2:1 AVB  Patient Profile: Kyle Barber is a 64 y.o. male with PMH of CAD s/p NSTEMI '20 with DES to LCx, DM, hx of tobacco use and HTN who is being seen 07/03/2024 for the evaluation of heart block.  History of Present Illness: Mr. Kyle Barber is a 64 yo male with PMH noted above. He has been followed by Dr. Alveta as an outpatient. He was treated for a NSTEMI and underwent PCI to the LCx in 2020.  Most recently seen in the clinic 12/30 with Kyle Barber after being seen in the ED at [facility] 12/27 (twice in 1 day) initially for elevated blood pressure and then for abdominal pain.  Was seen again the following day with for chest pain with negative troponins.  When seen in the clinic on 12/30 reported he was having nonexertional, fleeting chest pain that occurred for only seconds at a time.  Also complained of neck pain and sinus congestion.  At this visit he was encouraged to remain compliant with statin and beta-blocker and restart aspirin .  Noted to have first-degree AV block on EKG but was asymptomatic therefore was continued on carvedilol  6.25 mg twice daily.  Presented to the ED 1/6 with complaints of lightheadedness and dizziness.  Noted that his BP was elevated as heart rate was low.  Called his PCP and recommended that he present to the ED for further evaluation.  ED labs showed sodium 139, potassium 4.6, creatinine 0.9, magnesium  1.2, AST 42, ALT 46, proBNP 427, high sensitive troponin negative, WBC 10.9, hemoglobin 14.9, TSH 2.02.  EKG shows 2:1 AV block, 38 bpm.  Respiratory panel negative.  X-ray negative.  Cardiology asked to evaluate.  In talking with patient he reports that he has been having intermittent symptoms of  sinus pressure, left neck/shoulder pain and elevated blood pressures for the past 2 weeks.  He has also had intermittent episodes of lightheadedness.   Past Medical History:  Diagnosis Date   Asthma 02/27/2016   Diabetes mellitus without complication (HCC)    Hypertension    Myocardial infarct (HCC)    Pancreatitis    Premature ventricular contraction    Past Surgical History:  Procedure Laterality Date   APPENDECTOMY     CORONARY STENT INTERVENTION N/A 05/14/2019   Procedure: CORONARY STENT INTERVENTION;  Surgeon: Kyle Sharper, MD;  Location: [facility] INVASIVE CV LAB;  Service: Cardiovascular;  Laterality: N/A;   HERNIA REPAIR     LEFT HEART CATH AND CORONARY ANGIOGRAPHY N/A 05/14/2019   Procedure: LEFT HEART CATH AND CORONARY ANGIOGRAPHY;  Surgeon: Kyle Sharper, MD;  Location: [facility] INVASIVE CV LAB;  Service: Cardiovascular;  Laterality: N/A;     Medications Prior to Admission: Prior to Admission medications  Medication Sig Start Date End Date Taking? Authorizing Provider  aspirin  EC 81 MG tablet Take 1 tablet (81 mg total) by mouth daily. Swallow whole. 06/26/24   Barber Thom CROME, PA-C  atorvastatin  (LIPITOR ) 80 MG tablet Take 1 tablet (80 mg total) by mouth daily at 6 PM. 01/19/24   Wendling, Mabel Mt, DO  azelastine  (ASTELIN ) 0.1 % nasal spray Place 2 sprays into both nostrils 2 (two) times daily. Use in each nostril as directed 05/29/24   Saguier, [place],  PA-C  carvedilol  (COREG ) 6.25 MG tablet Take 1 tablet (6.25 mg total) by mouth 2 (two) times daily with a meal. 01/19/24   Wendling, Mabel Mt, DO  levalbuterol (XOPENEX HFA) 45 MCG/ACT inhaler Inhale 1-2 puffs into the lungs every 6 (six) hours as needed for wheezing or shortness of breath. 01/20/24   Kyle, Mabel Mt, DO  losartan  (COZAAR ) 50 MG tablet TAKE 1 TABLET BY MOUTH EVERY DAY 06/29/24   Saguier, [place], PA-C  nitroGLYCERIN  (NITROSTAT ) 0.4 MG SL tablet Place 1 tablet (0.4 mg total) under the tongue every 5 (five)  minutes as needed for chest pain. 09/08/22   Kyle Mabel Mt, DO  Semaglutide , 1 MG/DOSE, (OZEMPIC , 1 MG/DOSE,) 4 MG/3ML SOPN INJECT 1 MG ONCE A WEEK AS DIRECTED 04/30/24   Kyle Mabel Mt, DO  sildenafil  (VIAGRA ) 100 MG tablet Take 0.5-1 tablets (50-100 mg total) by mouth daily as needed for erectile dysfunction. 09/08/22   Kyle Mabel Mt, DO     Allergies:   Allergies[1]  Social History:   Social History   Socioeconomic History   Marital status: Divorced    Spouse name: Not on file   Number of children: Not on file   Years of education: Not on file   Highest education level: Not on file  Occupational History   Not on file  Tobacco Use   Smoking status: Former    Current packs/day: 0.00    Average packs/day: 0.3 packs/day for 10.0 years (2.5 ttl pk-yrs)    Types: Cigarettes    Start date: 06/30/2001    Quit date: 07/01/2011    Years since quitting: 13.0   Smokeless tobacco: Former    Quit date: 06/29/2011  Vaping Use   Vaping status: Never Used  Substance and Sexual Activity   Alcohol use: Yes    Comment: weekly   Drug use: No   Sexual activity: Not on file  Other Topics Concern   Not on file  Social History Narrative   Not on file   Social Drivers of Health   Tobacco Use: Medium Risk (06/26/2024)   Patient History    Smoking Tobacco Use: Former    Smokeless Tobacco Use: Former    Passive Exposure: Not on Stage Manager: Not on [employer] Insecurity: Not on file  Transportation Needs: Not on file  Physical Activity: Not on file  Stress: Not on file  Social Connections: Not on file  Intimate Partner Violence: Not At Risk (06/24/2024)   Received from [facility]   HITS    Over the last 12 months how often did your partner physically hurt you?: Never    Over the last 12 months how often did your partner insult you or talk down to you?: Never    Over the last 12 months how often did your partner threaten you with physical  harm?: Never    Over the last 12 months how often did your partner scream or curse at you?: Never  Depression (PHQ2-9): Medium Risk (05/29/2024)   Depression (PHQ2-9)    PHQ-2 Score: 7  Alcohol Screen: Not on file  Housing: Not on file  Utilities: Not on file  Health Literacy: Not on file     Family History:   The patient's family history includes CAD in his father and paternal grandfather; Diabetes in his father and paternal grandmother; Emphysema in his mother.    ROS:  Please see the history of present illness.  All other ROS reviewed  and negative.     Physical Exam/Data: Vitals:   07/03/24 1009 07/03/24 1303  BP: 112/74 124/75  Pulse: (!) 45 (!) 58  Resp: 16 18  Temp: 98.6 F (37 C) 97.7 F (36.5 C)  TempSrc: Oral   SpO2: 96% 99%   No intake or output data in the 24 hours ending 07/03/24 1442    06/26/2024   10:56 AM 05/29/2024    9:34 AM 03/05/2024    4:12 PM  Last 3 Weights  Weight (lbs) 185 lb 6.4 oz 189 lb 3.2 oz 186 lb  Weight (kg) 84.097 kg 85.821 kg 84.369 kg     There is no height or weight on file to calculate BMI.  General:  Well nourished, well developed, in no acute distress HEENT: normal Neck: no JVD Vascular: Distal pulses 2+ bilaterally   Cardiac:  normal S1, S2; bradycardic; no murmur  Lungs:  clear to auscultation bilaterally, no wheezing, rhonchi or rales  Abd: soft, non-tender Ext: no edema Musculoskeletal:  No deformities, BUE and BLE strength normal and equal Skin: warm and dry  Neuro: no focal abnormalities noted Psych:  Normal affect   EKG:  The ECG that was done 07/04/2023 was personally reviewed and demonstrates 2:1 AVB 38 bpm  Relevant CV Studies:  Echo: 04/2019  IMPRESSIONS     1. Left ventricular ejection fraction, by visual estimation, is 55 to  60%. The left ventricle has normal function. There is no left ventricular  hypertrophy.   2. Basal and mid anterolateral wall and basal and mid inferolateral wall  are abnormal.    3. Definity  contrast agent was given IV to delineate the left ventricular  endocardial borders.   4. The left ventricle demonstrates regional wall motion abnormalities.   5. Normal LV EF with mild hypokinesis of base to mid  inferior/inferolateral wall.   6. Global right ventricle has normal systolic function.The right  ventricular size is normal. No increase in right ventricular wall  thickness.   7. Left atrial size was normal.   8. Right atrial size was normal.   9. Trivial pericardial effusion is present.  10. The mitral valve is normal in structure. No evidence of mitral valve  regurgitation. No evidence of mitral stenosis.  11. The tricuspid valve is normal in structure. Tricuspid valve  regurgitation is not demonstrated.  12. The aortic valve is tricuspid. Aortic valve regurgitation is not  visualized. No evidence of aortic valve sclerosis or stenosis.  13. The pulmonic valve was grossly normal. Pulmonic valve regurgitation is  not visualized.  14. TR signal is inadequate for assessing pulmonary artery systolic  pressure.  15. The inferior vena cava is normal in size with greater than 50%  respiratory variability, suggesting right atrial pressure of 3 mmHg.   FINDINGS   Left Ventricle: Left ventricular ejection fraction, by visual estimation,  is 55 to 60%. The left ventricle has normal function. Definity  contrast  agent was given IV to delineate the left ventricular endocardial borders.  The left ventricle demonstrates  regional wall motion abnormalities. There is no left ventricular  hypertrophy. Left ventricular diastolic parameters were normal.     LV Wall Scoring:  The basal and mid anterolateral wall and basal and mid inferolateral wall  are  hypokinetic. All remaining scored segments are normal.   Right Ventricle: The right ventricular size is normal. No increase in  right ventricular wall thickness. Global RV systolic function is has  normal systolic function.    Left  Atrium: Left atrial size was normal in size.   Right Atrium: Right atrial size was normal in size   Pericardium: Trivial pericardial effusion is present.   Mitral Valve: The mitral valve is normal in structure. No evidence of  mitral valve stenosis by observation. No evidence of mitral valve  regurgitation.   Tricuspid Valve: The tricuspid valve is normal in structure. Tricuspid  valve regurgitation is not demonstrated.   Aortic Valve: The aortic valve is tricuspid. Aortic valve regurgitation is  not visualized. The aortic valve is structurally normal, with no evidence  of sclerosis or stenosis.   Pulmonic Valve: The pulmonic valve was grossly normal. Pulmonic valve  regurgitation is not visualized.   Aorta: The aortic root, ascending aorta and aortic arch are all  structurally normal, with no evidence of dilitation or obstruction.   Pulmonary Artery: The pulmonary artery is not well seen.   Venous: The inferior vena cava is normal in size with greater than 50%  respiratory variability, suggesting right atrial pressure of 3 mmHg.   IAS/Shunts: No atrial level shunt detected by color flow Doppler.   Cath: 04/2019  1. Acute total occlusion of the left circumflex, treated successfully with stenting (3.0x18 mm Resolute Onyx DES) 2. Moderate mid-LAD stenosis 3. Patent RCA (dominant vessel)   Recommend: DAPT with ASA and ticagrelor  x 12 months. Favor medical therapy for residual CAD unless recurrent angina/ischemic symptoms.   Laboratory Data: High Sensitivity Troponin:  No results for input(s): TROPONINIHS in the last 720 hours.  Recent Labs  Lab 07/03/24 1136  TRNPT <15        Chemistry Recent Labs  Lab 07/03/24 1136  NA 139  K 4.6  CL 104  CO2 24  GLUCOSE 188*  BUN 16  CREATININE 0.90  CALCIUM  8.8*  MG 1.2*  GFRNONAA >60  ANIONGAP 10    Recent Labs  Lab 07/03/24 1136  PROT 6.8  ALBUMIN 4.1  AST 42*  ALT 46*  ALKPHOS 65  BILITOT 1.2    Lipids No results for input(s): CHOL, TRIG, HDL, LABVLDL, LDLCALC, CHOLHDL in the last 168 hours. Hematology Recent Labs  Lab 07/03/24 1019  WBC 10.9*  RBC 4.13*  HGB 14.9  HCT 40.6  MCV 98.3  MCH 36.1*  MCHC 36.7*  RDW 11.8  PLT 220   Thyroid  Recent Labs  Lab 07/03/24 1019  TSH 2.020   BNP Recent Labs  Lab 07/03/24 1136  PROBNP 427.0*    DDimer No results for input(s): DDIMER in the last 168 hours.  Radiology/Studies:  DG Chest 2 View Result Date: 07/03/2024 CLINICAL DATA:  Palpitations.  Discomfort.  Shortness of breath. EXAM: CHEST - 2 VIEW COMPARISON:  08/14/2019. FINDINGS: The heart size and mediastinal contours are within normal limits. No focal consolidation, pleural effusion, or pneumothorax. Remote healed bilateral rib fractures. No acute osseous abnormality. IMPRESSION: No acute cardiopulmonary findings. Electronically Signed   By: Harrietta Sherry M.D.   On: 07/03/2024 11:57     Assessment and Plan:  JAXXON NAEEM is a 64 y.o. male with PMH of CAD s/p NSTEMI '20 with DES to LCx, DM, hx of tobacco use and HTN who is being seen 07/03/2024 for the evaluation of heart block.  2:1 AVB -- Presented to the ED with elevated blood pressures and low heart rates as well as lightheadedness and sinus pressure -- EKG shows 2-1 AV block, 38 bpm -- He is on carvedilol  6.25 mg twice daily with last dose being 6  AM this morning. -- Will admit and hold beta-blocker for washout.  Follow on telemetry, may need EP evaluation -- check echo  HTN -- Blood pressures reasonably controlled in the ED. as above holding Coreg .  Will hold losartan  for now to allow for slightly higher pressure for compensation  CAD s/p DES to Lcx '20 -- Denies any anginal symptoms, high-sensitivity troponin negative x 1  Hypomagnesemia  -- Mg 1.2, replete  DM -- Hgb A1c 8.6 -- on ozempic  PTA, add SSI while inpatient   Risk Assessment/Risk Scores:    Code Status: Full  Code  Severity of Illness: The appropriate patient status for this patient is INPATIENT. Inpatient status is judged to be reasonable and necessary in order to provide the required intensity of service to ensure the patient's safety. The patient's presenting symptoms, physical exam findings, and initial radiographic and laboratory data in the context of their chronic comorbidities is felt to place them at high risk for further clinical deterioration. Furthermore, it is not anticipated that the patient will be medically stable for discharge from the hospital within 2 midnights of admission.   * I certify that at the point of admission it is my clinical judgment that the patient will require inpatient hospital care spanning beyond 2 midnights from the point of admission due to high intensity of service, high risk for further deterioration and high frequency of surveillance required.*  For questions or updates, please contact [facility] Please consult [email or web address] for contact info under   Signed, Manuelita Rummer, NP  07/03/2024 2:42 PM   I have personally seen and examined this patient. I agree with the assessment and plan as outlined above by Manuelita Rummer, NP. 64 yo male with history of CAD, HTN, HLD who presented to the ED with c/o dizziness.  He was found to have 2:1 AV block with ventricular rate of 38 bpm.  He is on Coreg  at home.  No chest pain or dyspnea Tele: Sinus 65 bpm-reviewed by me EKG sinus, 2:1 AV block-reviewed by me Labs reviewed by me My exam: General: Well developed, well nourished, NAD  SKIN: warm, dry. Neuro: No focal deficits  Psychiatric: Mood and affect normal  Neck: No JVD Lungs:Clear bilaterally, no wheezes, rhonci, crackles Cardiovascular: Regular rate and rhythm. No murmurs, gallops or rubs. Abdomen:Soft.  Extremities: No lower extremity edema.   Plan: 2:1 AV block: He had some dizziness. No syncope. Will hold Coreg . Admit and follow on telemetry.  I suspect this will resolve off of his beta blocker.   Lonni Cash, MD, [facility] 07/03/2024 2:46 PM      [1]  Allergies Allergen Reactions   Codeine Nausea Only   "

## 2024-07-03 NOTE — ED Provider Notes (Signed)
 "  EMERGENCY DEPARTMENT AT  HOSPITAL Provider Note   CSN: [id number] Arrival date & time: 07/03/24  9061     Patient presents with: Dizziness, Headache, and Shortness of Breath   Kyle Barber is a 64 y.o. male with a past medical history significant for diabetes, asthma, HTN, HL,and CAD status post stent placement who presents to the ED due to episodic lightheadedness, shortness of breath, palpitations x 1.5 weeks.  Patient states he has been having labile blood pressure readings throughout the day. Some readings are high in the 160s while others are low in the 90s, systolic.  He notes he has been having intermittent episodes of feeling unwell which he describes as pulsating feeling throughout his arms and head.  He also admits to palpitations where he feels like his heart is skipping a beat.  Previous history of PVCs.  No history of A-fib or other arrhythmias.  Was seen in the ED twice 1 week ago for blood pressure and then later for a kidney stone. During his most recent ED stay he was told he has a 1st degree AV block.  Patient is currently on carvedilol  and losartan  which he has been compliant with.  Last had chest pain earlier this morning which lasted a few seconds while lying in bed. Admits to some congestion; however no fever or cough.   History obtained from patient and past medical records. No interpreter used during encounter.       Prior to Admission medications  Medication Sig Start Date End Date Taking? Authorizing Provider  aspirin  EC 81 MG tablet Take 1 tablet (81 mg total) by mouth daily. Swallow whole. 06/26/24   Darryle Thom CROME, PA-C  atorvastatin  (LIPITOR ) 80 MG tablet Take 1 tablet (80 mg total) by mouth daily at 6 PM. 01/19/24   Wendling, Mabel Mt, DO  azelastine  (ASTELIN ) 0.1 % nasal spray Place 2 sprays into both nostrils 2 (two) times daily. Use in each nostril as directed 05/29/24   Saguier, [place], PA-C  carvedilol  (COREG ) 6.25 MG tablet Take  1 tablet (6.25 mg total) by mouth 2 (two) times daily with a meal. 01/19/24   Wendling, Mabel Mt, DO  levalbuterol [facility] HFA) 45 MCG/ACT inhaler Inhale 1-2 puffs into the lungs every 6 (six) hours as needed for wheezing or shortness of breath. 01/20/24   [name], DO  losartan  (COZAAR ) 50 MG tablet TAKE 1 TABLET BY MOUTH EVERY DAY 06/29/24   Saguier, [place], PA-C  nitroGLYCERIN  (NITROSTAT ) 0.4 MG SL tablet Place 1 tablet (0.4 mg total) under the tongue every 5 (five) minutes as needed for chest pain. 09/08/22   [name], DO  Semaglutide , 1 MG/DOSE, (OZEMPIC , 1 MG/DOSE,) 4 MG/3ML SOPN INJECT 1 MG ONCE A WEEK AS DIRECTED 04/30/24   Frann, Mabel Mt, DO  sildenafil  (VIAGRA ) 100 MG tablet Take 0.5-1 tablets (50-100 mg total) by mouth daily as needed for erectile dysfunction. 09/08/22   [name], DO    Allergies: Codeine    Review of Systems  Respiratory:  Positive for shortness of breath.   Cardiovascular:  Positive for chest pain and palpitations.  Neurological:  Positive for light-headedness.    Updated Vital Signs BP 124/75   Pulse (!) 58   Temp 97.7 F (36.5 C)   Resp 18   SpO2 99%   Physical Exam Vitals and nursing note reviewed.  Constitutional:      General: He is not in acute distress.  Appearance: He is not ill-appearing.  HENT:     Head: Normocephalic.  Eyes:     Pupils: Pupils are equal, round, and reactive to light.  Cardiovascular:     Rate and Rhythm: Normal rate and regular rhythm.     Pulses: Normal pulses.     Heart sounds: Normal heart sounds. No murmur heard.    No friction rub. No gallop.  Pulmonary:     Effort: Pulmonary effort is normal.     Breath sounds: Normal breath sounds.  Abdominal:     General: Abdomen is flat. There is no distension.     Palpations: Abdomen is soft.     Tenderness: There is no abdominal tenderness. There is no guarding or rebound.  Musculoskeletal:        General: Normal  range of motion.     Cervical back: Neck supple.  Skin:    General: Skin is warm and dry.  Neurological:     General: No focal deficit present.     Mental Status: He is alert.  Psychiatric:        Mood and Affect: Mood normal.        Behavior: Behavior normal.     (all labs ordered are listed, but only abnormal results are displayed) Labs Reviewed  CBC WITH DIFFERENTIAL/PLATELET - Abnormal; Notable for the following components:      Result Value   WBC 10.9 (*)    RBC 4.13 (*)    MCH 36.1 (*)    MCHC 36.7 (*)    All other components within normal limits  COMPREHENSIVE METABOLIC PANEL WITH GFR - Abnormal; Notable for the following components:   Glucose, Bld 188 (*)    Calcium  8.8 (*)    AST 42 (*)    ALT 46 (*)    All other components within normal limits  MAGNESIUM  - Abnormal; Notable for the following components:   Magnesium  1.2 (*)    All other components within normal limits  PRO BRAIN NATRIURETIC PEPTIDE - Abnormal; Notable for the following components:   Pro Brain Natriuretic Peptide 427.0 (*)    All other components within normal limits  CBG MONITORING, ED - Abnormal; Notable for the following components:   Glucose-Capillary 202 (*)    All other components within normal limits  RESP PANEL BY RT-PCR (RSV, FLU A&B, COVID)  RVPGX2  TSH  TROPONIN T, HIGH SENSITIVITY  TROPONIN T, HIGH SENSITIVITY    EKG: EKG Interpretation Date/Time:  Tuesday July 03 2024 14:26:15 EST Ventricular Rate:  61 PR Interval:  308 QRS Duration:  88 QT Interval:  414 QTC Calculation: 417 R Axis:   66  Text Interpretation: Sinus rhythm Prolonged PR interval Borderline low voltage, extremity leads Consider anterior infarct Confirmed by Freddi Hamilton [id number]) on 07/03/2024 2:59:59 PM  Radiology: [facility] Chest 2 View Result Date: 07/03/2024 CLINICAL DATA:  Palpitations.  Discomfort.  Shortness of breath. EXAM: CHEST - 2 VIEW COMPARISON:  08/14/2019. FINDINGS: The heart size and mediastinal  contours are within normal limits. No focal consolidation, pleural effusion, or pneumothorax. Remote healed bilateral rib fractures. No acute osseous abnormality. IMPRESSION: No acute cardiopulmonary findings. Electronically Signed   By: Harrietta Sherry M.D.   On: 07/03/2024 11:57     Procedures   Medications Ordered in the ED  magnesium  sulfate IVPB 4 g 100 mL (has no administration in time range)    Clinical Course as of 07/03/24 1501  Tue Jul 03, 2024  1431 Discussed with Leonor  with pharmacy who recommends giving patient 4g Magnesium  sulfate [CA]    Clinical Course User Index [CA] Lorelle Aleck BROCKS, PA-C                                 Medical Decision Making Amount and/or Complexity of Data Reviewed External Data Reviewed: notes.    Details: Cardiology notes Labs: ordered. Decision-making details documented in ED Course. Radiology: ordered and independent interpretation performed. Decision-making details documented in ED Course. ECG/medicine tests: ordered and independent interpretation performed. Decision-making details documented in ED Course.  Risk Prescription drug management. Decision regarding hospitalization.   This patient presents to the ED for concern of episodic lightheadedness, palpitations, SOB this involves an extensive number of treatment options, and is a complaint that carries with it a high risk of complications and morbidity.  The differential diagnosis includes cardiac arrhythmia, AV block, electrolyte abnormality, etc  64 year old male presents to the ED due to episodes of lightheadedness, palpitations, and shortness of breath x 1.5 weeks.  Has had labile blood pressure readings over the past week.  While patient was in triage EKG was obtained which was concerning for a second-degree type II block with 2:1 ratio with heart rate at 38.  Cardiology was consulted in triage who will come see patient.  Labs ordered in triage.  Unfortunately, patient waited  over 4.5 hours prior to initial evaluation due to long wait times.  On exam, patient well-appearing.  Lungs clear to auscultation bilaterally.  No lower extremity edema. Repeat EKG ordered after initial evaluation.   CBC with slight leukocytosis at 10.9.  Normal hemoglobin.  CMP with hyperglycemia 188.  No anion gap.  Low suspicion for DKA.  Transaminitis with AST at 42 and ALT at 46.  Which Appears to be chronically elevated.  Magnesium  1.2.  Discussed with pharmacy see note above.  4 g magnesium  sulfate given.  RVP negative.  Troponin normal.  BNP mildly elevated at 427.  Chest x-ray personally viewed interpreted negative for signs of pneumonia, pneumothorax, widened mediastinum.  Discussed with Cardiology who will admit patient for AV block.  Co morbidities that complicate the patient evaluation  HTN Cardiac Monitoring: / EKG:  The patient was maintained on a cardiac monitor.  I personally viewed and interpreted the cardiac monitored which showed an underlying rhythm of: 2nd degree AV block  Social Determinants of Health:  Lives independently  Test / Admission - Considered:  Admit to cardiology service for AV block      Final diagnoses:  Second degree AV block, Mobitz type II  Palpitations  Lightheaded  Hypotension, unspecified hypotension type  Chest pain, unspecified type    ED Discharge Orders     None          Lorelle Aleck BROCKS, [place] 07/03/24 1502  "

## 2024-07-03 NOTE — ED Provider Triage Note (Signed)
 Emergency Medicine Provider Triage Evaluation Note  Kyle Barber , a 64 y.o. male  was evaluated in triage.  Pt complains of palpitations, intermittent chest discomfort, fatigue, some shortness of breath, and slow heart rate..  Review of Systems  Positive: Palpitations, intermittent chest discomfort, shortness of breath, lightheadedness, fatigue, variable blood pressure Negative: Nausea, vomiting, constipation, urinary changes.  Physical Exam  BP 112/74 (BP Location: Right Arm)   Pulse (!) 45   Temp 98.6 F (37 C) (Oral)   Resp 16   SpO2 96%  Gen:   Awake, no distress   Resp:  Normal effort  MSK:   Moves extremities without difficulty  Other:  No focal neurologic deficits on exam.  Medical Decision Making  Medically screening exam initiated at 10:13 AM.  Appropriate orders placed.  Kyle Barber was informed that the remainder of the evaluation will be completed by another provider, this initial triage assessment does not replace that evaluation, and the importance of remaining in the ED until their evaluation is complete.  Kyle Barber is a 64 y.o. male with a past medical history significant for CAD with previous NSTEMI, hypertension, diabetes, hypertriglyceridemia, and previous appendectomy who presents for intermittent chest discomfort, shortness of breath, palpitations, low heart rate, variable blood pressure including hypotension and hypertension, and fatigue.  According to patient, for the last week and a half he has been having these episodes of some palpitations but also some intermittent chest discomfort.  He started taking his blood pressure and heart rate and has found his heart rate going down to the 30s.  It is in the 30s right now on arrival.  He reports his blood pressure has been getting elevated but also going down into the hypotensive range.  He has documented blood pressures down to the 80s over the last few days.  When his heart rate is going low, his blood  pressure decreases and he feels lightheaded and fatigued.  He had no syncopal episodes.  He occasionally has palpitations and chest discomfort that goes to his left jaw but feels very different than his MI pains previously.  He does report some mild congestion but no new focal cough.  No other symptoms reported.  On exam, lungs clear.  Chest is nontender.  I did not appreciate a murmur.  Abdomen nontender.  No focal neurologic deficits.  No significant edema in the legs.  Patient resting blood pressures around 110s systolic.  EKG is concerning for a second-degree type II block with a 2:1 ratio.  Heart rate is 38.  No STEMI.  He tells me that his cardiologist that he had a type I block last week and now has a type II block with intermittent hypotension and chest discomfort.  Will call cardiology and get labs and imaging for him.  10:26 AM Spoke with cards master who will have cardiology come see the patient to determine disposition.  Anticipate further evaluation when he gets to a room to get seen by another provider.    Isaiah Torok, Lonni PARAS, MD 07/03/24 1027

## 2024-07-04 ENCOUNTER — Ambulatory Visit: Admitting: Family Medicine

## 2024-07-04 ENCOUNTER — Encounter (HOSPITAL_COMMUNITY): Payer: Self-pay | Admitting: Cardiovascular Disease

## 2024-07-04 ENCOUNTER — Other Ambulatory Visit: Payer: Self-pay

## 2024-07-04 ENCOUNTER — Inpatient Hospital Stay (HOSPITAL_COMMUNITY)

## 2024-07-04 DIAGNOSIS — I441 Atrioventricular block, second degree: Secondary | ICD-10-CM

## 2024-07-04 DIAGNOSIS — I442 Atrioventricular block, complete: Principal | ICD-10-CM

## 2024-07-04 DIAGNOSIS — R001 Bradycardia, unspecified: Secondary | ICD-10-CM

## 2024-07-04 LAB — ECHOCARDIOGRAM COMPLETE
AR max vel: 1.54 cm2
AV Area VTI: 1.54 cm2
AV Area mean vel: 1.69 cm2
AV Mean grad: 4 mmHg
AV Peak grad: 7.5 mmHg
Ao pk vel: 1.37 m/s
Area-P 1/2: 1.62 cm2
Calc EF: 60.1 %
Height: 67 in
S' Lateral: 3.4 cm
Single Plane A2C EF: 58.7 %
Single Plane A4C EF: 61.8 %
Weight: 2994.73 [oz_av]

## 2024-07-04 LAB — BASIC METABOLIC PANEL WITH GFR
Anion gap: 10 (ref 5–15)
BUN: 15 mg/dL (ref 8–23)
CO2: 25 mmol/L (ref 22–32)
Calcium: 8.8 mg/dL — ABNORMAL LOW (ref 8.9–10.3)
Chloride: 104 mmol/L (ref 98–111)
Creatinine, Ser: 0.75 mg/dL (ref 0.61–1.24)
GFR, Estimated: >60 mL/min
Glucose, Bld: 159 mg/dL — ABNORMAL HIGH (ref 70–99)
Potassium: 4 mmol/L (ref 3.5–5.1)
Sodium: 138 mmol/L (ref 135–145)

## 2024-07-04 NOTE — ED Notes (Signed)
 Provider, Verlin MD aware of patient heart rhythm changes. EKG performed.

## 2024-07-04 NOTE — Consult Note (Addendum)
 "   ELECTROPHYSIOLOGY CONSULT NOTE    Patient ID: Kyle Barber MRN: [id number], DOB/AGE: Feb 12, 1961 64 y.o.  Admit date: 07/03/2024 Date of Consult: 07/04/2024  Primary Physician: Kyle Mabel Mt, DO Primary Cardiologist: Kyle Cash, MD  Electrophysiologist: Dr. Kennyth   Referring Provider: @ATTENDING @  Patient Profile: Kyle Barber is a 64 y.o. male with a history of CAD s/p NSTEMI '20 with DES to LCx, DM, hx of tobacco use, HTN, first degree AVB who is being seen today for the evaluation of 2:1 AV block at the request of Dr. Cash.  HPI:  Kyle Barber is a 64 y.o. male with above noted medical history.   In reviewing patient's chart, noted that he was seen in the [facility] ED twice on 12/27 and again on 12/28. First 12/27 visit for elevated BP, second for LLQ pain along with headache and dizziness. CT found 7mm obstructing left proximal ureteral stone. Notes from both ED visits report second degree heart block and patient was recommended to follow up with cardiology urgently. All medications including Coreg  continued per ED AVS. Patient then presented to the ED a third time on 12/28 with complaint of chest pain and palpitations. Lab workup found normal troponin and urgent follow up with cardiology again recommended. Patient saw Kyle Sluder PA-C in clinic on 12/30 and reported occasional nonexertional chest pain. Clinic ECG found profound first degree AV block with PR >317ms but given lack of active symptoms, beta blocker continued after reviewing with Dr. Francyne.   Patient presented to the ED on 1/6 at the suggestion of his PCP upon reporting more symptoms of dizziness/lightheadedness with elevated BP and low HR. In the ED, triage ECG with 2:1 AV block HR 38 bpm. Of note, patient with several weeks of intermittent lightheadedness.   Labs Potassium4.0 (01/07 0435) Magnesium   1.2* (01/06 1136) Creatinine, ser  0.75 (01/07 0435) PLT  220 (01/06  1019) HGB  14.9 (01/06 1019) WBC 10.9* (01/06 1019)  .    Past Medical History:  Diagnosis Date   Asthma 02/27/2016   Diabetes mellitus without complication (HCC)    Hypertension    Myocardial infarct (HCC)    Pancreatitis    Premature ventricular contraction      Surgical History:  Past Surgical History:  Procedure Laterality Date   APPENDECTOMY     CORONARY STENT INTERVENTION N/A 05/14/2019   Procedure: CORONARY STENT INTERVENTION;  Surgeon: Wonda Sharper, MD;  Location: [facility] INVASIVE CV LAB;  Service: Cardiovascular;  Laterality: N/A;   HERNIA REPAIR     LEFT HEART CATH AND CORONARY ANGIOGRAPHY N/A 05/14/2019   Procedure: LEFT HEART CATH AND CORONARY ANGIOGRAPHY;  Surgeon: Wonda Sharper, MD;  Location: [facility] INVASIVE CV LAB;  Service: Cardiovascular;  Laterality: N/A;     (Not in a hospital admission)   Inpatient Medications:   aspirin  EC  81 mg Oral Daily   atorvastatin   80 mg Oral q1800    Allergies: Allergies[1]  Family History  Problem Relation Age of Onset   Emphysema Mother    Diabetes Father    CAD Father        Died of MI at age 71   Diabetes Paternal Grandmother    CAD Paternal Grandfather        died at age 5     Physical Exam: Vitals:   07/04/24 0200 07/04/24 0400 07/04/24 0600 07/04/24 0800  BP: 116/73 136/76 124/85 110/77  Pulse: 64 64 63 (!) 57  Resp:  18 15 15 18   Temp:    98.6 F (37 C)  TempSrc:    Oral  SpO2: 100% 98% 97% 99%  Weight:    84.9 kg  Height:    5' 7 (1.702 m)    GEN- NAD, A&O x 3, normal affect HEENT: Normocephalic, atraumatic Lungs- CTAB, Normal effort.  Heart- bradycardia with slightly irregular rhythm 2/2 AVB, No M/G/R.  GI- Soft, NT, ND.  Extremities- No clubbing, cyanosis, or edema   Radiology/Studies: DG Chest 2 View Result Date: 07/03/2024 CLINICAL DATA:  Palpitations.  Discomfort.  Shortness of breath. EXAM: CHEST - 2 VIEW COMPARISON:  08/14/2019. FINDINGS: The heart size and mediastinal contours are within  normal limits. No focal consolidation, pleural effusion, or pneumothorax. Remote healed bilateral rib fractures. No acute osseous abnormality. IMPRESSION: No acute cardiopulmonary findings. Electronically Signed   By: Harrietta Sherry M.D.   On: 07/03/2024 11:57    EKG:Most recent ECG with intermittent 2:1 AVB, variable first degree AVB (personally reviewed)  TELEMETRY: Varying AV block. First degree, second degree type I and II, complete heart block (personally reviewed)  DEVICE HISTORY: N/A  Assessment/Plan:  High grade AV block Symptomatic bradycardia Patient with several weeks of intermittent dizziness and hx first degree AVB recently found to have second degree heart block at outside hospital/ED. Presented to our ED on 1/6 with symptomatic bradycardia, initial ECG showed 2:1 AV block. Home Coreg  has been held, with last dose taken 1/6 AM. On telemetry, mix of first degree, second degree type I and II, complete heart block with escape rate 38-40bpm.  Complete 48 hour Coreg  washout. Echocardiogram today. If patient continues with high grade AV block tomorrow and is found with preserved LVEF, anticipate dual chamber PPM implant with Dr. Kennyth tomorrow. Procedure orders placed.   Hypertension Patient with recently elevated BP. Possibly compensatory with intermittent AV block. Home Losartan  and Coreg  held.   Coronary artery disease Recent chest pain not consistent with angina. Negative troponin both at outside hospital and with current admission.   Diabetes On Ozempic  at home, SSI while admitted.     For questions or updates, please contact [facility] Please consult [email or web address] for contact info under     Signed, Kyle Pouch, PA-C  07/04/2024, 9:39 AM     I have seen, examined the patient, and reviewed the above assessment and plan.    HPI: Kyle Barber is a 64 year old male with past medical history notable for coronary artery disease status post PCI DM, hx of  tobacco use, HTN who presented to the ED at the recommendation of his PCP for symptomatic bradycardia.  Patient had had multiple recent ED visits and was found to have second-degree heart block and was advised to follow-up with cardiology urgently.  He then discussed symptoms of dizziness and lightheadedness with his PCP and was instructed to present to the ED for further evaluation.  At rest, he reports feeling okay.  He has had ongoing dizziness and lightheadedness for weeks which correlated with low heart rates.  General: Well developed, in no acute distress.  Neck: No JVD.  Cardiac: Bradycardic, regular rhythm.  Resp: Normal work of breathing.  Ext: No edema.  Neuro: No gross focal deficits.  Psych: Normal affect.   LHC 04/2019:  1. Acute total occlusion of the left circumflex, treated successfully with stenting (3.0x18 mm Resolute Onyx DES) 2. Moderate mid-LAD stenosis 3. Patent RCA (dominant vessel)   Recommend: DAPT with ASA and ticagrelor  x 12 months. Favor medical  therapy for residual CAD unless recurrent angina/ischemic symptoms.   Echo 04/2019:  1. Left ventricular ejection fraction, by visual estimation, is 55 to  60%. The left ventricle has normal function. There is no left ventricular  hypertrophy.   2. Basal and mid anterolateral wall and basal and mid inferolateral wall  are abnormal.   3. Definity  contrast agent was given IV to delineate the left ventricular  endocardial borders.   4. The left ventricle demonstrates regional wall motion abnormalities.   5. Normal LV EF with mild hypokinesis of base to mid  inferior/inferolateral wall.   6. Global right ventricle has normal systolic function.The right  ventricular size is normal. No increase in right ventricular wall  thickness.   7. Left atrial size was normal.   8. Right atrial size was normal.   9. Trivial pericardial effusion is present.  10. The mitral valve is normal in structure. No evidence of mitral valve   regurgitation. No evidence of mitral stenosis.  11. The tricuspid valve is normal in structure. Tricuspid valve  regurgitation is not demonstrated.  12. The aortic valve is tricuspid. Aortic valve regurgitation is not  visualized. No evidence of aortic valve sclerosis or stenosis.  13. The pulmonic valve was grossly normal. Pulmonic valve regurgitation is  not visualized.  14. TR signal is inadequate for assessing pulmonary artery systolic  pressure.  15. The inferior vena cava is normal in size with greater than 50%  respiratory variability, suggesting right atrial pressure of 3 mmHg.   Assessment: Mr. Zechman is a 64 year old male with past medical history notable for coronary artery disease status post PCI DM, hx of tobacco use, HTN who presented to the ED with symptomatic bradycardia.  He has baseline first-degree AV block and known AV Wenckebach, but now is exhibiting evidence of high-grade AV block and intermittent complete heart block.  He is on carvedilol , but given his baseline conduction disease, it is unlikely stopping this alone will lead to resolution.  No other reversible etiologies have been identified.  Problem List:  Symptomatic bradycardia Complete heart block  Plan:  -We will continue holding carvedilol  and monitor for conduction recovery today.  If patient remains with bradycardia and intermittent complete heart block after 48 hours of carvedilol , then we will pursue permanent pacemaker implant, potentially tomorrow afternoon.  We will obtain echocardiogram in the interim in order to reassess LVEF prior to implantation.  Keep n.p.o. after midnight. -Explained risks, benefits, and alternatives to pacemaker implantation, including but not limited to bleeding, infection, damage to heart or lungs, heart attack, stroke, or death.  Pt verbalized understanding and agrees to proceed if indicated.   [name], MD 07/04/2024 9:51 PM       [1]  Allergies Allergen Reactions    Codeine Nausea Only   "

## 2024-07-05 ENCOUNTER — Inpatient Hospital Stay (HOSPITAL_COMMUNITY)
Admission: EM | Disposition: A | Discharge status: Home / Self Care | Payer: Self-pay | Source: Home / Self Care | Attending: Cardiovascular Disease

## 2024-07-05 ENCOUNTER — Other Ambulatory Visit: Payer: Self-pay

## 2024-07-05 HISTORY — PX: PACEMAKER IMPLANT: EP1218

## 2024-07-05 LAB — SURGICAL PCR SCREEN
MRSA, PCR: NEGATIVE
Staphylococcus aureus: NEGATIVE

## 2024-07-05 MED ORDER — LIDOCAINE HCL (PF) 1 % IJ SOLN
INTRAMUSCULAR | Status: DC | PRN
  Administered 2024-07-05: 50 mL via INTRADERMAL

## 2024-07-05 MED ORDER — SODIUM CHLORIDE 0.9 % IV SOLN
INTRAVENOUS | Status: AC
  Filled 2024-07-05: qty 2

## 2024-07-05 MED ORDER — CHLORHEXIDINE GLUCONATE 4 % EX SOLN: 60.0000 mL | Freq: Once | CUTANEOUS | Status: DC

## 2024-07-05 MED ORDER — MIDAZOLAM HCL 5 MG/5ML IJ SOLN
INTRAMUSCULAR | Status: DC | PRN
  Administered 2024-07-05: 1 mg via INTRAVENOUS
  Administered 2024-07-05: .5 mg via INTRAVENOUS
  Administered 2024-07-05: 1 mg via INTRAVENOUS
  Administered 2024-07-05: .5 mg via INTRAVENOUS
  Administered 2024-07-05: 1 mg via INTRAVENOUS

## 2024-07-05 MED ORDER — CEFAZOLIN SODIUM-DEXTROSE 2-4 GM/100ML-% IV SOLN
2.0000 g | INTRAVENOUS | Status: AC
  Administered 2024-07-05: 2 g via INTRAVENOUS

## 2024-07-05 MED ORDER — HEPARIN (PORCINE) IN NACL 1000-0.9 UT/500ML-% IV SOLN
INTRAVENOUS | Status: DC | PRN
  Administered 2024-07-05: 500 mL

## 2024-07-05 MED ORDER — ACETAMINOPHEN 325 MG PO TABS
ORAL_TABLET | ORAL | Status: AC
  Filled 2024-07-05: qty 2

## 2024-07-05 MED ORDER — FENTANYL CITRATE (PF) 100 MCG/2ML IJ SOLN
INTRAMUSCULAR | Status: DC | PRN
  Administered 2024-07-05: 25 ug via INTRAVENOUS
  Administered 2024-07-05: 50 ug via INTRAVENOUS
  Administered 2024-07-05: 25 ug via INTRAVENOUS
  Administered 2024-07-05 (×2): 50 ug via INTRAVENOUS

## 2024-07-05 MED ORDER — SODIUM CHLORIDE 0.9 % IV SOLN: INTRAVENOUS | Status: DC

## 2024-07-05 MED ORDER — MIDAZOLAM HCL 2 MG/2ML IJ SOLN
INTRAMUSCULAR | Status: AC
  Filled 2024-07-05: qty 2

## 2024-07-05 MED ORDER — SODIUM CHLORIDE 0.9 % IV SOLN
80.0000 mg | INTRAVENOUS | Status: AC
  Administered 2024-07-05: 80 mg

## 2024-07-05 MED ORDER — LIDOCAINE HCL (PF) 1 % IJ SOLN
INTRAMUSCULAR | Status: AC
  Filled 2024-07-05: qty 30

## 2024-07-05 MED ORDER — CEFAZOLIN SODIUM-DEXTROSE 2-4 GM/100ML-% IV SOLN
INTRAVENOUS | Status: AC
  Filled 2024-07-05: qty 100

## 2024-07-05 MED ORDER — FENTANYL CITRATE (PF) 100 MCG/2ML IJ SOLN
INTRAMUSCULAR | Status: AC
  Filled 2024-07-05: qty 2

## 2024-07-05 NOTE — Discharge Instructions (Addendum)
 After Your Pacemaker   You have a Abbott Pacemaker  If you have a Medtronic or Biotronik device, plug in your home monitor once you get home, and no manual interaction is required.   If you have an Abbott or [employer]ozone device, plug your home monitor once you get home, sit near the device, and press the large activation button. Sit nearby until the process is complete, usually notated by lights on the monitor.   If you were set up for monitoring using an app on your phone, make sure the app remains open in the background and the Bluetooth remains on.  ACTIVITY Do not lift your arm above shoulder height for 1 week after your procedure. After 7 days, you may progress as below.  You should remove your sling 24 hours after your procedure, unless otherwise instructed by your provider.     Thursday July 12, 2024  Friday July 13, 2024 Saturday July 14, 2024 Sunday July 15, 2024   Do not lift, push, pull, or carry anything over 10 pounds with the affected arm until 6 weeks (Thursday August 16, 2024 ) after your procedure.   You may drive AFTER your wound check, unless you have been told otherwise by your provider.   Ask your healthcare provider when you can go back to work   INCISION/Dressing  Do not remove steri-strips or glue as below.   If a PRESSURE DRESSING (a bulky dressing that usually goes up over your shoulder) was applied or left in place, please follow instructions given by your provider on when to return to have this removed.   Monitor your Pacemaker site for redness, swelling, and drainage. Call the device clinic at [phone] if you experience these symptoms or fever/chills.  If your incision is sealed with Steri-strips or staples, you may shower 7 days after your procedure or when told by your provider. Do not remove the steri-strips or let the shower hit directly on your site. You may wash around your site with soap and water.    If you were discharged  in a sling, please do not wear this during the day more than 48 hours after your surgery unless otherwise instructed. This may increase the risk of stiffness and soreness in your shoulder.   Avoid lotions, ointments, or perfumes over your incision until it is well-healed.  You may use a hot tub or a pool AFTER your wound check appointment if the incision is completely closed.  Pacemaker Alerts:  Some alerts are vibratory and others beep. These are NOT emergencies. Please call our office to let us  know. If this occurs at night or on weekends, it can wait until the next business day. Send a remote transmission.  If your device is capable of reading fluid status (for heart failure), you will be offered monthly monitoring to review this with you.   DEVICE MANAGEMENT Remote monitoring is used to monitor your pacemaker from home. This monitoring is scheduled every 91 days by our office. It allows us  to keep an eye on the functioning of your device to ensure it is working properly. You will routinely see your Electrophysiologist annually (more often if necessary).  This will appear as a REMOTE check on your MyChart schedule. These are automatic and there is nothing for you to manually do unless otherwise instructed.  You should receive your ID card for your new device in 4-8 weeks. Keep this card with you at all times once received. Consider wearing a medical  alert bracelet or necklace.  Your Pacemaker may be MRI compatible. This will be discussed at your next office visit/wound check.  You should avoid contact with strong electric or magnetic fields.   Do not use amateur (ham) radio equipment or electric (arc) welding torches. MP3 player headphones with magnets should not be used. Some devices are safe to use if held at least 12 inches (30 cm) from your Pacemaker. These include power tools, lawn mowers, and speakers. If you are unsure if something is safe to use, ask your health care provider.  When  using your cell phone, hold it to the ear that is on the opposite side from the Pacemaker. Do not leave your cell phone in a pocket over the Pacemaker.  You may safely use electric blankets, heating pads, computers, and microwave ovens.  Call the office right away if: You have chest pain. You feel more short of breath than you have felt before. You feel more light-headed than you have felt before. Your incision starts to open up.  This information is not intended to replace advice given to you by your health care provider. Make sure you discuss any questions you have with your health care provider.

## 2024-07-05 NOTE — Interval H&P Note (Signed)
 History and Physical Interval Note:  07/05/2024 11:48 AM  Kyle Barber  has presented today for surgery, with the diagnosis of symptomatic bradycardia and intermittent complete heart block.  The various methods of treatment have been discussed with the patient and family. After consideration of risks, benefits and other options for treatment, the patient has consented to  Procedures: PACEMAKER IMPLANT (N/A) as a surgical intervention.  The patient's history has been reviewed, patient examined, no change in status, stable for surgery.  I have reviewed the patient's chart and labs.  Questions were answered to the patient's satisfaction.     Kyle Barber

## 2024-07-05 NOTE — Progress Notes (Addendum)
 "  Patient Name: Kyle Barber Date of Encounter: 07/05/2024  Primary Cardiologist: Lonni Cash, MD Electrophysiologist: None > Dr. Kennyth   Interval Summary   The patient is doing well today.  He has multiple questions regarding implant / device and post op follow up. At this time, the patient denies chest pain, shortness of breath, or any new concerns.  Vital Signs    Vitals:   07/05/24 0100 07/05/24 0312 07/05/24 0400 07/05/24 0609  BP: 123/79  128/75 130/81  Pulse: 69  61 74  Resp: 16  16 15   Temp:  98.3 F (36.8 C)  98.1 F (36.7 C)  TempSrc:  Oral  Oral  SpO2: 97%  96% 98%  Weight:      Height:       No intake or output data in the 24 hours ending 07/05/24 0911 Filed Weights   07/04/24 0800  Weight: 84.9 kg    Physical Exam    GEN- pleasant adult male in NAD, Alert and oriented  Lungs- Clear to ausculation bilaterally, normal work of breathing Cardiac- Regular rate and rhythm, no murmurs, rubs or gallops GI- soft, NT, ND, + BS Extremities- no clubbing or cyanosis. No edema  Telemetry    SR 60's with 1AVB, PVC's, intermittent Mobitz I, 2:1 / high grade AVB (personally reviewed)  Hospital Course    [name] is a 64 y.o. male with PMH of CAD s/p NSTEMI '20 with DES to LCx, DM, hx of tobacco use, HTN, first degree AVB admitted for lightheadedness, dizziness and elevated BP (in PCP office) with EKG concerning for 2:1 AVB.  Pt noted to have intermittent SR with 1AVB, Mobitz I, 2:1 and CHB.  Assessment & Plan    Symptomatic Bradycardia  High Grade AVB  -last dose Coreg  1/6 am > off Coreg  x48 hours with some improvement in conduction but continues to have intermittent 2:1 AVB which warrants PPM -NPO for planned dual PPM after clear liquid breakfast  -risks / benefits reviewed with patient who agrees to proceed with implant   PVC's -plan to resume Coreg  post implant  Hypertension  -allow for elevated pressure, anticipate improvement once  device placed  CAD  -no anginal symptoms   DM  -continue home meds      For questions or updates, please contact [facility] Please consult [email or web address] for contact info under     Signed, Daphne Barrack, NP-C, AGACNP-BC [facility] - Electrophysiology  07/05/2024, 9:14 AM  I have seen, examined the patient, and reviewed the above assessment and plan.    Interval:  No acute overnight events. Patient reports feeling relatively well. No new or acute complaints.   General: Well developed, in no acute distress.  Neck: No JVD.  Cardiac: Normal rate, regular rhythm.  Resp: Normal work of breathing.  Ext: No edema.  Neuro: No gross focal deficits.  Psych: Normal affect.   Assessment:  Kyle Barber is a 64 year old male with past medical history notable for coronary artery disease status post PCI DM, hx of tobacco use, HTN who presented to the ED with symptomatic bradycardia.  He has baseline first-degree AV block and known AV Wenckebach, but now is exhibiting evidence of high-grade AV block and intermittent complete heart block.  He is on carvedilol , but given his baseline conduction disease, it is unlikely stopping this alone will lead to resolution.  No other reversible etiologies have been identified.  After 24 hours of holding carvedilol , he remained in intermittent  CHB. After holding carvedilol  for 48 hours, he is predominantly sinus with long first degree with AV Wenckebach but still has 2:1 conduction at times. Given his episodes of near syncope, baseline chronic conduction disease, likelihood for progression and symptomatic PVCs responsive to beta blocker therapy, pacemaker implantation seems the most reasonable option at this time. Patient is in agreement.    Problem List:  Symptomatic bradycardia Complete heart block   Plan:  -Explained risks, benefits, and alternatives to pacemaker implantation, including but not limited to bleeding, infection, damage to  heart or lungs, heart attack, stroke, or death.  Pt verbalized understanding and elected to proceed. Plan for implant today. Will resume carvedilol  after.      [name], MD 07/05/2024 11:43 AM    "

## 2024-07-05 NOTE — H&P (View-Only) (Signed)
 "  Patient Name: Kyle Barber Date of Encounter: 07/05/2024  Primary Cardiologist: Lonni Cash, MD Electrophysiologist: None > Dr. Kennyth   Interval Summary   The patient is doing well today.  He has multiple questions regarding implant / device and post op follow up. At this time, the patient denies chest pain, shortness of breath, or any new concerns.  Vital Signs    Vitals:   07/05/24 0100 07/05/24 0312 07/05/24 0400 07/05/24 0609  BP: 123/79  128/75 130/81  Pulse: 69  61 74  Resp: 16  16 15   Temp:  98.3 F (36.8 C)  98.1 F (36.7 C)  TempSrc:  Oral  Oral  SpO2: 97%  96% 98%  Weight:      Height:       No intake or output data in the 24 hours ending 07/05/24 0911 Filed Weights   07/04/24 0800  Weight: 84.9 kg    Physical Exam    GEN- pleasant adult male in NAD, Alert and oriented  Lungs- Clear to ausculation bilaterally, normal work of breathing Cardiac- Regular rate and rhythm, no murmurs, rubs or gallops GI- soft, NT, ND, + BS Extremities- no clubbing or cyanosis. No edema  Telemetry    SR 60's with 1AVB, PVC's, intermittent Mobitz I, 2:1 / high grade AVB (personally reviewed)  Hospital Course    Kyle Barber is a 64 y.o. male with PMH of CAD s/p NSTEMI '20 with DES to LCx, DM, hx of tobacco use, HTN, first degree AVB admitted for lightheadedness, dizziness and elevated BP (in PCP office) with EKG concerning for 2:1 AVB.  Pt noted to have intermittent SR with 1AVB, Mobitz I, 2:1 and CHB.  Assessment & Plan    Symptomatic Bradycardia  High Grade AVB  -last dose Coreg  1/6 am > off Coreg  x48 hours with some improvement in conduction but continues to have intermittent 2:1 AVB which warrants PPM -NPO for planned dual PPM after clear liquid breakfast  -risks / benefits reviewed with patient who agrees to proceed with implant   PVC's -plan to resume Coreg  post implant  Hypertension  -allow for elevated pressure, anticipate improvement once  device placed  CAD  -no anginal symptoms   DM  -continue home meds      For questions or updates, please contact [facility] Please consult [email or web address] for contact info under     Signed, Daphne Barrack, NP-C, AGACNP-BC [facility] - Electrophysiology  07/05/2024, 9:14 AM  I have seen, examined the patient, and reviewed the above assessment and plan.    Interval:  No acute overnight events. Patient reports feeling relatively well. No new or acute complaints.   General: Well developed, in no acute distress.  Neck: No JVD.  Cardiac: Normal rate, regular rhythm.  Resp: Normal work of breathing.  Ext: No edema.  Neuro: No gross focal deficits.  Psych: Normal affect.   Assessment:  Kyle Barber is a 64 year old male with past medical history notable for coronary artery disease status post PCI DM, hx of tobacco use, HTN who presented to the ED with symptomatic bradycardia.  He has baseline first-degree AV block and known AV Wenckebach, but now is exhibiting evidence of high-grade AV block and intermittent complete heart block.  He is on carvedilol , but given his baseline conduction disease, it is unlikely stopping this alone will lead to resolution.  No other reversible etiologies have been identified.  After 24 hours of holding carvedilol , he remained in intermittent  CHB. After holding carvedilol  for 48 hours, he is predominantly sinus with long first degree with AV Wenckebach but still has 2:1 conduction at times. Given his episodes of near syncope, baseline chronic conduction disease, likelihood for progression and symptomatic PVCs responsive to beta blocker therapy, pacemaker implantation seems the most reasonable option at this time. Patient is in agreement.    Problem List:  Symptomatic bradycardia Complete heart block   Plan:  -Explained risks, benefits, and alternatives to pacemaker implantation, including but not limited to bleeding, infection, damage to  heart or lungs, heart attack, stroke, or death.  Pt verbalized understanding and elected to proceed. Plan for implant today. Will resume carvedilol  after.      [name], MD 07/05/2024 11:43 AM    "

## 2024-07-06 ENCOUNTER — Other Ambulatory Visit: Payer: Self-pay

## 2024-07-06 ENCOUNTER — Inpatient Hospital Stay (HOSPITAL_COMMUNITY)

## 2024-07-06 ENCOUNTER — Encounter (HOSPITAL_COMMUNITY): Payer: Self-pay | Admitting: Cardiology

## 2024-07-06 DIAGNOSIS — Z950 Presence of cardiac pacemaker: Secondary | ICD-10-CM

## 2024-07-06 MED ORDER — LOSARTAN POTASSIUM 50 MG PO TABS: 25.0000 mg | ORAL_TABLET | Freq: Every day | ORAL | Status: DC

## 2024-07-06 MED FILL — Midazolam HCl Inj 2 MG/2ML (Base Equivalent): INTRAMUSCULAR | Qty: 4 | Status: AC

## 2024-07-06 MED FILL — Gentamicin Sulfate Inj 40 MG/ML: INTRAMUSCULAR | Qty: 80 | Status: AC

## 2024-07-06 NOTE — TOC CM/SW Note (Signed)
 Transition of Care [facility]) - Inpatient Brief Assessment   Patient Details  Name: Kyle Barber MRN: [id number] Date of Birth: 05/16/1961  [facility] of [facility]) CM/SW Contact:    Sudie Erminio Deems, RN Phone Number: 07/06/2024, 11:10 AM   Clinical Narrative: Patient post pacemaker implantation. PTA patient was independent from home with support of sons. Patient has insurance and PCP. No history of home health services. No home needs identified during this visit. Family to transport home via private vehicle.   Transition of Care Asessment: Insurance and Status: Insurance coverage has been reviewed Patient has primary care physician: Yes Home environment has been reviewed: reviewed Prior level of function:: independent Prior/Current Home Services: No current home services Social Drivers of Health Review: SDOH reviewed no interventions necessary Readmission risk has been reviewed: Yes Transition of care needs: no transition of care needs at this time

## 2024-07-06 NOTE — Discharge Summary (Cosign Needed)
 "     ELECTROPHYSIOLOGY PROCEDURE DISCHARGE SUMMARY    Patient ID: Kyle Barber,  MRN: [id number], DOB/AGE: 02-24-1961 64 y.o.  Admit date: 07/03/2024 Discharge date: 07/06/2024  Primary Care Physician: [name], DO  Primary Cardiologist: Lonni Cash, MD  Electrophysiologist: EP Team   Primary Discharge Diagnosis:  Symptomatic bradycardia status post pacemaker implantation this admission  Secondary Discharge Diagnosis:  hypertension  Allergies[1]   Procedures This Admission:  1.  Implantation of a Abbott Dual Chamber PPM on 07/05/2024 by Dr. Kennyth. The patient received a Abbott Assurity [id number] with a Abbott Ultipace 1231-52 right atrial lead and a Abbott Ultipace 1231 right ventricular lead.  There were no immediate post procedure complications.   2.  CXR on 07/06/2024 demonstrated no pneumothorax status post device implantation.       Brief HPI: Kyle Barber is a 64 y.o. male was admitted for 2:1 AVB and electrophysiology team asked to see for consideration of PPM implantation.  Past medical history includes CAD s/p NSTEMI '20 with DES to LCx, DM, hx of tobacco use, HTN, first degree AVB.  The patient has had AV block without reversible causes identified.  Risks, benefits, and alternatives to PPM implantation were reviewed with the patient who wished to proceed.   Hospital Course:  The patient was admitted and underwent implantation of a Abbott dual chamber PPM with details as outlined above.  He was monitored on telemetry overnight which demonstrated appropriate pacing.  Left chest was without hematoma or ecchymosis.  The device was interrogated and found to be functioning normally.  CXR was obtained and demonstrated no pneumothorax status post device implantation.  Wound care, arm mobility, and restrictions were reviewed with the patient.  The patient was examined and considered stable for discharge to home.    Anticoagulation resumption This patient  is not on anticoagulation    Physical Exam: Vitals:   07/05/24 2107 07/06/24 0007 07/06/24 0351 07/06/24 0742  BP: 121/79 130/74 117/72 137/88  Pulse: 75 70 64 71  Resp:  18 18 18   Temp: 98.1 F (36.7 C) 98.1 F (36.7 C) 97.9 F (36.6 C) 98.1 F (36.7 C)  TempSrc: Oral Oral Oral Oral  SpO2: 95% 93% 97% 97%  Weight:      Height:        GEN- NAD. A&O x 3.  HEENT: Normocephalic, atraumatic Lungs- CTAB, Normal effort.  Heart- RRR, No M/G/R.  GI- Soft, NT, ND.  Extremities- No clubbing, cyanosis, or edema;  Skin- warm and dry, no rash or lesion, left chest without hematoma/ecchymosis  Discharge Medications:  Allergies as of 07/06/2024       Reactions   Codeine Nausea Only        Medication List     TAKE these medications    acetaminophen  500 MG tablet Commonly known as: TYLENOL  Take 1,000 mg by mouth every 6 (six) hours as needed.   aspirin  EC 81 MG tablet Take 1 tablet (81 mg total) by mouth daily. Swallow whole.   atorvastatin  80 MG tablet Commonly known as: LIPITOR  Take 1 tablet (80 mg total) by mouth daily at 6 PM.   azelastine  0.1 % nasal spray Commonly known as: ASTELIN  Place 2 sprays into both nostrils 2 (two) times daily. Use in each nostril as directed What changed:  when to take this reasons to take this   carvedilol  6.25 MG tablet Commonly known as: COREG  Take 1 tablet (6.25 mg total) by mouth 2 (two) times daily with  a meal.   ibuprofen  200 MG tablet Commonly known as: ADVIL  Take 200-800 mg by mouth every 6 (six) hours as needed for mild pain (pain score 1-3) or moderate pain (pain score 4-6).   ibuprofen  600 MG tablet Commonly known as: ADVIL  Take 600 mg by mouth.   levalbuterol 45 MCG/ACT inhaler Commonly known as: XOPENEX HFA Inhale 1-2 puffs into the lungs every 6 (six) hours as needed for wheezing or shortness of breath.   losartan  50 MG tablet Commonly known as: COZAAR  Take 0.5 tablets (25 mg total) by mouth daily. What  changed: how much to take   nitroGLYCERIN  0.4 MG SL tablet Commonly known as: NITROSTAT  Place 1 tablet (0.4 mg total) under the tongue every 5 (five) minutes as needed for chest pain.   ondansetron  4 MG disintegrating tablet Commonly known as: ZOFRAN -ODT Take 4 mg by mouth every 8 (eight) hours as needed.   oxyCODONE -acetaminophen  5-325 MG tablet Commonly known as: PERCOCET/ROXICET Take by mouth.   Ozempic  (1 MG/DOSE) 4 MG/3ML Sopn Generic drug: Semaglutide  (1 MG/DOSE) INJECT 1 MG ONCE A WEEK AS DIRECTED   sildenafil  100 MG tablet Commonly known as: Viagra  Take 0.5-1 tablets (50-100 mg total) by mouth daily as needed for erectile dysfunction.   tamsulosin  0.4 MG Caps capsule Commonly known as: FLOMAX  Take 0.4 mg by mouth.        Disposition:  Home with usual follow up as in AVS   Duration of Discharge Encounter:  APP time: 20 minutes  Signed, Artist Pouch, PA-C  07/06/2024 10:28 AM        [1]  Allergies Allergen Reactions   Codeine Nausea Only   "

## 2024-07-18 ENCOUNTER — Ambulatory Visit: Attending: Cardiology

## 2024-07-18 DIAGNOSIS — I441 Atrioventricular block, second degree: Secondary | ICD-10-CM | POA: Diagnosis not present

## 2024-07-18 NOTE — Patient Instructions (Addendum)
" ° °  After Your Pacemaker   Monitor your pacemaker site for redness, swelling, and drainage. Call the device clinic at [phone] if you experience these symptoms or fever/chills.  Your incision was closed with Steri-strips or staples:  You may shower 7 days after your procedure and wash your incision with soap and water. Avoid lotions, ointments, or perfumes over your incision until it is well-healed.  You may use a hot tub or a pool after your wound check appointment if the incision is completely closed.  Do not lift, push or pull greater than 10 pounds with the affected arm until 6 weeks after your procedure 08/17/2024. There are no other restrictions in arm movement after your wound check appointment.  You may drive, unless driving has been restricted by your healthcare providers.  Remote monitoring is used to monitor your pacemaker from home. This monitoring is scheduled every 91 days by our office. It allows us  to keep an eye on the functioning of your device to ensure it is working properly. You will routinely see your Electrophysiologist annually (more often if necessary).  "

## 2024-07-19 ENCOUNTER — Ambulatory Visit: Payer: Self-pay | Admitting: Cardiology

## 2024-07-19 LAB — CUP PACEART INCLINIC DEVICE CHECK
Battery Remaining Longevity: 135 mo
Battery Voltage: 3.07 V
Brady Statistic RA Percent Paced: 2.2 %
Brady Statistic RV Percent Paced: 47 %
Date Time Interrogation Session: 20260121162200
Implantable Lead Connection Status: 753985
Implantable Lead Connection Status: 753985
Implantable Lead Implant Date: 20260108
Implantable Lead Implant Date: 20260108
Implantable Lead Location: 753859
Implantable Lead Location: 753860
Implantable Pulse Generator Implant Date: 20260108
Lead Channel Impedance Value: 462.5 Ohm
Lead Channel Impedance Value: 512.5 Ohm
Lead Channel Pacing Threshold Amplitude: 0.5 V
Lead Channel Pacing Threshold Amplitude: 0.75 V
Lead Channel Pacing Threshold Pulse Width: 0.5 ms
Lead Channel Pacing Threshold Pulse Width: 0.5 ms
Lead Channel Sensing Intrinsic Amplitude: 12 mV
Lead Channel Sensing Intrinsic Amplitude: 4 mV
Lead Channel Setting Pacing Amplitude: 1 V
Lead Channel Setting Pacing Amplitude: 1.5 V
Lead Channel Setting Pacing Pulse Width: 0.5 ms
Lead Channel Setting Sensing Sensitivity: 2 mV
Pulse Gen Model: 2272
Pulse Gen Serial Number: 8335007

## 2024-07-19 NOTE — Progress Notes (Signed)
 Normal dual chamber pacemaker wound check. Presenting rhythm: AS/VS 71. Wound well healed. Routine testing performed. Thresholds, sensing, and impedance consistent with implant measurements and at 3.5V safety margin/auto capture until 3 month visit. No episodes. Reviewed arm restrictions to continue for 6 weeks total post op.  Pt enrolled in remote follow-up.

## 2024-08-03 ENCOUNTER — Telehealth: Payer: Self-pay | Admitting: Cardiology

## 2024-08-03 MED ORDER — LOSARTAN POTASSIUM 50 MG PO TABS: 25.0000 mg | ORAL_TABLET | Freq: Every day | ORAL | 0 refills | Status: AC

## 2024-08-03 NOTE — Telephone Encounter (Signed)
 Refill sent

## 2024-08-03 NOTE — Telephone Encounter (Signed)
" °*  STAT* If patient is at the pharmacy, call can be transferred to refill team.   1. Which medications need to be refilled? (please list name of each medication and dose if known) losartan  (COZAAR ) 50 MG tablet   2. Which pharmacy/location (including street and city if local pharmacy) is medication to be sent to?  [id number] [place],  - [address] Phone: [phone]  Fax: [phone]      3. Do they need a 30 day or 90 day supply? 90  "

## 2024-08-15 ENCOUNTER — Ambulatory Visit

## 2024-08-16 ENCOUNTER — Ambulatory Visit

## 2024-10-05 ENCOUNTER — Ambulatory Visit: Admitting: Student

## 2024-11-14 ENCOUNTER — Ambulatory Visit

## 2024-11-15 ENCOUNTER — Ambulatory Visit

## 2025-02-13 ENCOUNTER — Ambulatory Visit

## 2025-02-14 ENCOUNTER — Ambulatory Visit

## 2025-05-15 ENCOUNTER — Ambulatory Visit

## 2025-05-16 ENCOUNTER — Ambulatory Visit
